# Patient Record
Sex: Female | Born: 1935 | Race: White | Hispanic: No | Smoking: Never smoker
Health system: Southern US, Community
[De-identification: ages and names within clinical notes are randomized; demographics above are authoritative.]

## PROBLEM LIST (undated history)

## (undated) DIAGNOSIS — E7800 Pure hypercholesterolemia, unspecified: Secondary | ICD-10-CM

## (undated) DIAGNOSIS — N200 Calculus of kidney: Secondary | ICD-10-CM

## (undated) DIAGNOSIS — E785 Hyperlipidemia, unspecified: Secondary | ICD-10-CM

## (undated) DIAGNOSIS — H532 Diplopia: Secondary | ICD-10-CM

## (undated) HISTORY — PX: OTHER SURGICAL HISTORY: SHX169

## (undated) HISTORY — PX: BACK SURGERY: SHX140

## (undated) HISTORY — PX: TUBAL LIGATION: SHX77

---

## 2000-11-23 ENCOUNTER — Encounter: Payer: Self-pay | Admitting: Internal Medicine

## 2000-11-23 ENCOUNTER — Ambulatory Visit (HOSPITAL_COMMUNITY): Admission: RE | Admit: 2000-11-23 | Discharge: 2000-11-23 | Payer: Self-pay | Admitting: Internal Medicine

## 2001-11-24 ENCOUNTER — Encounter: Payer: Self-pay | Admitting: Internal Medicine

## 2001-11-24 ENCOUNTER — Ambulatory Visit (HOSPITAL_COMMUNITY): Admission: RE | Admit: 2001-11-24 | Discharge: 2001-11-24 | Payer: Self-pay | Admitting: Internal Medicine

## 2002-11-28 ENCOUNTER — Ambulatory Visit (HOSPITAL_COMMUNITY): Admission: RE | Admit: 2002-11-28 | Discharge: 2002-11-28 | Payer: Self-pay | Admitting: Internal Medicine

## 2003-01-06 ENCOUNTER — Other Ambulatory Visit: Admission: RE | Admit: 2003-01-06 | Discharge: 2003-01-06 | Payer: Self-pay | Admitting: Obstetrics & Gynecology

## 2003-01-29 ENCOUNTER — Inpatient Hospital Stay (HOSPITAL_COMMUNITY): Admission: EM | Admit: 2003-01-29 | Discharge: 2003-02-01 | Payer: Self-pay | Admitting: Emergency Medicine

## 2005-06-26 ENCOUNTER — Ambulatory Visit (HOSPITAL_COMMUNITY): Admission: RE | Admit: 2005-06-26 | Discharge: 2005-06-26 | Payer: Self-pay | Admitting: Internal Medicine

## 2006-07-06 ENCOUNTER — Ambulatory Visit (HOSPITAL_COMMUNITY): Admission: RE | Admit: 2006-07-06 | Discharge: 2006-07-06 | Payer: Self-pay | Admitting: Internal Medicine

## 2007-07-27 ENCOUNTER — Emergency Department (HOSPITAL_COMMUNITY): Admission: EM | Admit: 2007-07-27 | Discharge: 2007-07-27 | Payer: Self-pay | Admitting: Emergency Medicine

## 2008-01-26 ENCOUNTER — Ambulatory Visit (HOSPITAL_COMMUNITY): Admission: RE | Admit: 2008-01-26 | Discharge: 2008-01-26 | Payer: Self-pay | Admitting: Orthopedic Surgery

## 2008-01-31 ENCOUNTER — Inpatient Hospital Stay (HOSPITAL_COMMUNITY): Admission: RE | Admit: 2008-01-31 | Discharge: 2008-02-02 | Payer: Self-pay | Admitting: Orthopedic Surgery

## 2009-07-26 ENCOUNTER — Ambulatory Visit (HOSPITAL_COMMUNITY): Admission: RE | Admit: 2009-07-26 | Discharge: 2009-07-26 | Payer: Self-pay | Admitting: Internal Medicine

## 2010-01-28 ENCOUNTER — Encounter: Payer: Self-pay | Admitting: Internal Medicine

## 2010-04-22 LAB — CBC
MCHC: 32.5 g/dL (ref 30.0–36.0)
MCV: 98 fL (ref 78.0–100.0)
Platelets: 237 10*3/uL (ref 150–400)
RBC: 4.48 MIL/uL (ref 3.87–5.11)

## 2010-04-22 LAB — BASIC METABOLIC PANEL
Calcium: 9.9 mg/dL (ref 8.4–10.5)
Chloride: 105 mEq/L (ref 96–112)
Creatinine, Ser: 0.67 mg/dL (ref 0.4–1.2)
GFR calc Af Amer: >60 mL/min (ref >60)
GFR calc non Af Amer: >60 mL/min (ref >60)
Glucose, Bld: 103 mg/dL — ABNORMAL HIGH (ref 70–99)
Potassium: 3.9 mEq/L (ref 3.5–5.1)
Sodium: 141 mEq/L (ref 135–145)

## 2010-04-22 LAB — HEMOGLOBIN AND HEMATOCRIT, BLOOD
HCT: 32 % — ABNORMAL LOW (ref 36.0–46.0)
Hemoglobin: 10.7 g/dL — ABNORMAL LOW (ref 12.0–15.0)

## 2010-05-21 NOTE — Op Note (Signed)
[name], BOEHNING              ACCOUNT NO.:  [id number]   MEDICAL RECORD NO.:  [id number]          PATIENT TYPE:  OIB   LOCATION:  5028                         FACILITY:  [facility]   PHYSICIAN:  Marlowe Kays, M.D.  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   OPERATION:  Central foraminal decompression L3-4, L4-5, and L5-S1.   SURGEON:  Marlowe Kays, MD   ASSISTANT:  Jene Every, MD   ANESTHESIA:  General.   JUSTIFICATION OF PROCEDURE:  Her symptoms remain in the left leg with  numbness over the lateral calf and foot area.  She also had a feeling of  weakness in the left foot, which is becoming progressively more severe,  slight similar problem on the right leg.  MRIs demonstrated significant  spinal stenosis and foraminal stenosis of all levels.  At L4-5, she had  a central disk bulge slightly more than at L5-S1.  This was creating  foraminal stenosis.   PROCEDURE:  Prophylactic antibiotics, satisfactory general anesthesia,  Foley catheter inserted, knee-chest position on the Andrews frame, back  was prepped with DuraPrep in sterile field.  Midline incision and I  dissected over where I thought were the spinous processes of L4-L5 and I  tagged these with Kocher clamps and took a lateral x-ray confirming that  we were indeed at L4-L5.  Dissected little further distally to expose  the sacrum and little more proximally to expose the L3 spinous process.  Soft tissue was dissected off the neural arches of all these levels and  2 McCullough retractors were placed.  With double-action rongeur, I  began removing all of the spinous process and good portion of neural  arch at L5 and L4 and large portion of L3.  Working first with  combination of 2 and 3-mm Kerrison rongeurs centrally, I then began  decompression of the interval L3 of the sacrum and when the  decompression became little more exact, we brought in the microscope to  complete it.  In the area of L4-5 on the left, she had enough  compression of bone that there was a small bleb in the dura, we noted,  and stayed away from this so as not to create an iatrogenic tear.  Her  foramen was patent to hockey stick at all levels.  We felt it was too  risky to look for the disk at L4-5 because of this bleb and in L5-S1,  the major problem was the creation of foraminal stenosis, which we  widely opened.  Therefore, trying to minimize risk, we felt at this  point that the decompression was completed.  The wound was irrigated  with sterile saline.  I gently placed Gelfoam beneath the residual  lateral bone on the left to cover up the bleb area, then covered the  remainder of the dura with the Gelfoam soaked in thrombin  as well.  Self-  retaining retractors were carefully removed and the fascia and muscle  closed as a layer with interrupted #1 Vicryl, leaving about a centimeter  and half aperture distally for egress of any fluid.  Subcutaneous  tissue was closed with 2-0 Vicryl.  Staples in the skin.  Betadine  Adaptic pressure dressing were applied.  She tolerated the procedure  well and was taken to recovery room in satisfactory condition with no  known complication.  Estimated blood loss was perhaps 400 mL.  No blood  replacement.           ______________________________  Marlowe Kays, M.D.     JA/MEDQ  D:  01/31/2008  T:  02/01/2008  Job:  [id number]

## 2010-05-21 NOTE — Discharge Summary (Signed)
[name], [name]              ACCOUNT NO.:  [id number]   MEDICAL RECORD NO.:  [id number]          PATIENT TYPE:  INP   LOCATION:  5028                         FACILITY:  [facility]   PHYSICIAN:  Marlowe Kays, M.D.  DATE OF BIRTH:  05/19/1934   DATE OF ADMISSION:  01/31/2008  DATE OF DISCHARGE:  02/02/2008                               DISCHARGE SUMMARY   ADMITTING DIAGNOSES:  1. Spinal foraminal stenosis at L3-L4, L4-L5, and L5-S1.  2. Hyperlipidemia.   DISCHARGE DIAGNOSES:  1. Spinal foraminal stenosis at L3-L4, L4-L5, and L5-S1.  2. Hyperlipidemia.  3. Underweight.  4. Mild postoperative anemia.   BRIEF HISTORY:  This 76 year old white female seen by [place] for left leg  numbness of the lateral calf and foot area.  Also had weakness in the  left foot.  She had a little difficulty with dorsiflexion of the foot  which had become more progressively severe.  Similar problems seen on  the right, but more so on the left.  She was sent for an MRI which  showed significant spinal stenosis as well as foraminal stenosis at all  levels.  Central disk bulge was seen at L4-5 and other one at L5-S1.  After much discussion including the risks and benefits of surgery, it  was decided she would benefit with the above procedure and was scheduled  for same.   COURSE IN THE HOSPITAL:  The patient tolerated surgical procedure quite  well.  Postoperatively, she was a little bit slow in getting going.  Fortunately, some of the weakness diminished in left lower extremity,  left with some mild numbness.  She could dorsiflex fairly well with the  left foot.  Wound remained clean and dry postoperatively, and today I  changed the dressing.   The patient has some nausea postoperatively.  We had a little difficulty  in trying to find a medication that would control some of her discomfort  as well as not cause nausea.  We settled on Talwin NX, which seemed to  help her.  She also had some positional  nausea accompanied by vomiting.  She had positive flatus and her abdomen was soft, so in all probability  she was not developing an ileus.  Today, we will check her hematocrit  and hemoglobin and if it is stable and she is able to work well with  physical therapy, she will be discharged home.  If not, we will keep her  another day for the patient's safety and confidence.  She was seen by  Earl Lagos, RN for the BMI being at 18.4, and we documented that  she was underweight.  Also with a hemoglobin at 10.9.  Postoperative  blood loss anemia was included.  Other laboratory values in the hospital  showed a preoperative CBC within normal limits.  Hemoglobin was 14.3 and  hematocrit was 43.9.  No chemistries were seen.  Chest x-ray showed mild  COPD changes, no acute pulmonary findings.  Electrocardiogram showed  normal sinus rhythm.   CONDITION ON ANTICIPATED DISCHARGE:  Improved, stable.   PLAN:  Again, if the patient has confidence  with no nausea in the  physical therapy day, we will discharge her home.  She is to return to  the office in 2 weeks after date of surgery.  She may shower 4 days  after surgery.  Use dry dressing as needed.   DISCHARGE MEDICATIONS:  She is to continue with her home medications:  1. Simvastatin 40 mg daily.  2. CoQ-10 100 mg at bedtime.  3. MVT multivitamin daily.  4. Estra-C daily.  5. Gabapentin which was prescribed preoperatively causes extreme dry      mouth, we therefore, will not continue that medication.  She has      only taken a couple.   Discharge medications from [place]:  1. Talwin NX for pain.  2. Robaxin 500 mg for muscle spasm.  3. Reglan 10 for any nausea.  4. I told them to pick up a stool softener when they go by the drug      store to get their medications filled as well as a laxative.   She is encouraged to call the office should they have any problems or  questions prior to their return appointment.  All questions were   encouraged and answered today at bedside.      Dooley L. Cherlynn June.    ______________________________  Marlowe Kays, M.D.    DLU/MEDQ  D:  02/02/2008  T:  02/02/2008  Job:  [id number]

## 2010-05-24 NOTE — H&P (Signed)
NAME:  [name], [name]                        ACCOUNT NO.:  [id number]   MEDICAL RECORD NO.:  [id number]                   PATIENT TYPE:  INP   LOCATION:  A318                                 FACILITY:  [facility]   PHYSICIAN:  [name], M.D.           DATE OF BIRTH:  Apr 15, 1934   DATE OF ADMISSION:  01/29/2003  DATE OF DISCHARGE:                                HISTORY & PHYSICAL   HISTORY:  This 76 year old woman has had cramps and diarrhea for the last 3  or 4 days and noted a little bit of blood on the toilet tissue last night  but in the early morning hours had some free rectal bleeding which repeated  itself several times.  At that point she came to the ER.   About 10 days ago she was seen by her LMD Dr. Ouida [name] for left facial and eye  pain which was felt to be sinusitis.  The following day she saw her dentist  and was found to have an abscess tooth.  She had been started on amoxicillin  by Dr. Ouida [name] and her dentist pulled the tooth and added metronidazole t.i.d.   She noted while on these antibiotics that she had nausea and eventually had  vomiting and dry heaves the last few days.   She generally had been healthy.  On this occasion she really had no fever  and chills.  She has noted the left eye and maxillary sinus area pain as  mentioned above.   Years ago she had a bilateral tubal ligation and also what sounds like  incidental appendectomy.  She had a small skin cancer removed from the face.  She did have a hemorrhoidectomy 3 years ago at the time she had a  colonoscopy which did show diverticuli.  Apparently she had a small  diverticular leak, which resulted in her being treated in the hospital with  IV antibiotics for 3 days after this.   She smoked cigarettes starting in her 87s but not smoking during her three  pregnancies for about 35 years of cigarette smoking.  Currently she is on  Zocor through Dr. Ouida [name] for hypercholesterolemia.   ADMISSION PHYSICAL  EXAMINATION:  She is a pleasant 76 year old woman who  presented to the emergency room this morning with a temperature of 98.4,  blood pressure 89/61, pulse 115, respiratory rate 20.  Within 3 or 4 hours  on IV fluids the blood pressure was 106/69, pulse 75, and then rechecked  several hours later 115/69 and 78.  O2 saturation was 97%.  On my exam she  is oriented, alert, no acute distress, well-developed, well-nourished 83-  year-old woman.  She had no axillary or supraclavicular adenopathy.  The  lungs were clear throughout moving air well.  The heart had a regular  rhythm, rate of about 70.  The abdomen was soft without hepatosplenomegaly  or mass but there was tenderness in the right lower  quadrant but not in the  right upper quadrant, epigastrium, left upper quadrant, or left lower  quadrant.  There is no edema of the ankles.   ADMISSION BLOOD WORK:  White cell count 20,400 of which 90% were  neutrophils.  The H&H were 13.8 and 40.5, MCV of 93, platelet count 315,000.  Further she had an albumin of 2.9 and MET-7 showed a potassium of 3.6,  sodium 135, glucose 127, BUN 9, creatinine 0.8.  UA shows specific gravity  1.004 and there were 7-10 wbc's per HPF, 0-2 rbc's per HPF, with few  bacteria.   Abdominal CT scan showed edema in the ascending and transverse colon.   ADMITTING DIAGNOSES:  1. Colitis.  2. Hypercholesterolemia.  3. History of cigarette smoking.  4. Left molar abscess which has resolved.   Colitis could be a pseudomembranous colitis secondary to antibiotic use, it  could represent antibiotic-associated diarrhea, it could represent ischemic  colitis given her risk factors of age, tobacco use and hypercholesterolemia,  or it could represent just a bacterial colitis.   Plan of treatment includes metronidazole p.o. three times daily and Levaquin  500 mg IV once daily.  We will recheck a CBC/MET-7 in the morning.  I have  discussed her case at length with Dr. Roetta [name], gastroenterologist.  We  will hold Advil, which she used once her cramps had started and also hold  Zocor until she returns home.     ___________________________________________                                         [name], M.D.   SDK/MEDQ  D:  01/29/2003  T:  01/29/2003  Job:  [id number]

## 2010-05-24 NOTE — Consult Note (Signed)
NAME:  Kathleen, [name]                        ACCOUNT NO.:  [id number]   MEDICAL RECORD NO.:  [id number]                   PATIENT TYPE:  INP   LOCATION:  A318                                 FACILITY:  [facility]   PHYSICIAN:  R. Roetta Sessions, M.D.              DATE OF BIRTH:  09-19-1934   DATE OF CONSULTATION:  01/29/2003  DATE OF DISCHARGE:                                   CONSULTATION   REASON FOR CONSULTATION:  Bloody diarrhea.   HISTORY OF PRESENT ILLNESS:  [name] is a very pleasant 25-  year-old Caucasian female who was in her usual state of fairly good health  until the past couple of weeks.  She developed some left mouth pain  initially felt to be sinusitis but it ultimately turned out to be a tooth  abscess.  She initially saw Dr. Ouida Sills, was started on amoxicillin.  She  ultimately saw Dr. Boris Lown, an oral surgeon down in [place], who pulled  tooth and found it to be abscessed, placed a drain.  Recent antibiotics  included amoxicillin, more recently metronidazole.  She was doing okay until  three days ago when she developed what she describes as waves of severe  abdominal cramps followed by multiple (greater than 20) nonbloody stools on  January 21 and January 28, 2003.  When she awoke this morning she started  having gross blood per rectum and felt weak; she was brought to the  emergency department where she was evaluated further.  She was found to be  hemodynamically stable although her systolic blood pressure was in the low  100 range.  She was found to have a white count of 20,400, H&H of 13.8 and  40.5.  CT scan of the abdomen demonstrated marked edema of the ascending and  transverse colon suggestive of colitis, there was no free air or evidence of  abscess.  She has been admitted to the hospital and placed on IV Cipro and  Flagyl.   She denies having any similar symptoms in the past.  She generally has one  bowel movement to two bowel movements daily.   No family history of IBD or  colorectal cancer.  She describes undergoing a colonoscopy here by Dr. Franky Macho some three years ago which was complicated by diverticular  perforation.  She tells me that she spent three days in the hospital and was  treated conservatively.  She did not have to have an operation (those  records are unavailable at this time).   PAST MEDICAL HISTORY:  Significant for hypercholesterolemia for which she  takes Zocor.  She denies any significant cardiopulmonary problems or  diabetes.   PAST SURGICAL HISTORY:  1. Bilateral tubal ligation.  2. Incidental appendectomy by Dr. Leveda Anna many years ago.  3. She did have a hemorrhoidectomy three years ago around the time of her     colonoscopy.   MEDICATIONS ON  ADMISSION:  1. Recent amoxicillin and metronidazole.  2. Zocor.   ALLERGIES:  NO KNOWN DRUG ALLERGIES.   FAMILY HISTORY:  No chronic GI or liver disease.   SOCIAL HISTORY:  The patient is married.  She lives in [place].  She has  a son who resides in [place] and a daughter who lives in [place].  She has a couple of grandchildren.  She had a 62-1/2 year-old son who died  with a virus.  She was a long-term smoker until stopping three years ago.  Notes no alcohol.  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   REVIEW OF SYSTEMS:  No recent chest pain, no dyspnea.  No odynophagia,  dysphagia, early sign of reflux symptoms.  She has had some nausea today and  the abdominal cramps, diarrhea and blood per rectum as outlined above.   PHYSICAL EXAMINATION:  Reveals a 76 year old lady ambulating in her room.  She is alert, conversant, appears to be in no acute distress.  BP 115/69,  pulse 78,  respiratory rate 20.  Skin warm and dry, there is no jaundice, no  continuous stigmata of chronic liver disease.  HEENT EXAM:  No scleral icterus, conjunctiva or pain core cavity, no  lesions.  JVD is not prominent.  CHEST/LUNGS:  Clear to  auscultation.  CARDIAC EXAM:  Regular rate and rhythm without murmur, gallop or rub.  BREAST EXAM:  Deferred.  ABDOMEN:  Nondistended, positive bowel sounds, soft.  She has mild diffuse  tenderness.  If anything, she is more tender diffusely on the left than the  right.  Liver is not enlarged but percussed right down to just below the  right costal margin.  There is no rebound, there is no mass, there is no  obvious ascites.  CT scan as stated above.  Some free fluid and notable  edema of the ascending and transverse colon.   ADDITIONAL LABORATORY DATA:  H&H 13.8 and 4.5, MCV 93.8.  Sodium 135,  potassium 3.6, chloride 105, CO2 28, glucose 127, BUN 9, creatinine 0.8,  bilirubin 0.6, alkaline phosphatase 53, SGOT 24, SGPT 13, lipase 30.  Urinalysis:  Trace ketones, small blood, trace leukocytes, 7-10 white cells.   IMPRESSION:  [name] is a pleasant 76 year old lady admitted to  the hospital with an acute illness characterized by abdominal cramps and  diarrhea which has now become bloody.  She has inflammatory changes on the  abdominal CT consistent with colitis.   She has recently been on antibiotics including amoxicillin, more recently  metronidazole.   This scenario is highly suspicious for Pseudomembranous colitis secondary to  amoxicillin.  However, ischemic colitis would also remain in the  differential, however she has had a more protracted period of nonbloody  diarrhea before the onset of blood per rectum to go with the classic  scenario for ischemic colitis which usually comes and goes in approximately  36 hours.  She does not look toxic at this time and no evidence of megacolon  or recent CT.   RECOMMENDATIONS:  1. I agree with starting IV Flagyl.  If this is a C.dif-related infection,     the Flagyl will be more efficacious given orally if she can tolerate it.     Will go with p.o. Flagyl 250 mg orally q.i.d. for the next 10 days.  If    she has difficulties,  will have to consider oral vancomycin pending     results of the C.dif  toxin assay.  Will also add Levsin sublingually     0.125 mg under the tongue before meals and at bedtime.  2. I have recommend to [name] she consider having a colonoscopy as an     outpatient once she is over the acute illness, however she has     reservations given her experience three years ago.   I thank Drs. Isabel Caprice for allowing [place] to see this nice lady today.      ___________________________________________                                            Jonathon Bellows, M.D.   RMR/MEDQ  D:  01/29/2003  T:  01/29/2003  Job:  [id number]   cc:   Mila Homer. Sudie Bailey, M.D.  [address] [place], [place] [address]  Fax: [phone]   Kingsley Callander. Ouida Sills, M.D.  [address]  [place]  [place] [address]  Fax: [phone]

## 2010-05-24 NOTE — Discharge Summary (Signed)
NAME:  [name], [name]                        ACCOUNT NO.:  [id number]   MEDICAL RECORD NO.:  [id number]                   PATIENT TYPE:  INP   LOCATION:  A318                                 FACILITY:  [facility]   PHYSICIAN:  Kingsley Callander. Ouida Sills, M.D.                  DATE OF BIRTH:  [birth year]   DATE OF ADMISSION:  01/29/2003  DATE OF DISCHARGE:                                 DISCHARGE SUMMARY   DISCHARGE DIAGNOSES:  1. Pseudomembranous colitis.  2. Hypokalemia.  3. Hypercholesterolemia.   PROCEDURES:  CT scan of the abdomen and pelvis.   HOSPITAL COURSE:  This patient is a 76 year old white female who presented  with crampy abdominal pain and diarrhea.  She was evaluated in the emergency  room with an acute abdominal series which revealed a nonobstructive bowel  gas pattern.  She had a CT scan of the abdomen and pelvis which revealed  mucosal edema of the right colon and transverse colon consistent with  colitis.  The patient had recently been on amoxicillin and metronidazole for  a dental abscess.  Her white count was 20.4 with 90 segs and 5 lymphs.  Her  albumin was 2.9.  Her stool for white cells revealed only a few white cells.  Two Clostridium difficile toxin titers were negative.  Her ova and parasite  exam was negative.  Her culture has been reincubated for better growth.   She was treated with oral metronidazole.  Her pain and diarrhea gradually  improved.  A repeat white count on January 30, 2003 was 18.5.  Her diet was  advanced from clear liquids to a bland diet.  She tolerated this reasonably  well.  Her repeat white count was 5.6.   She was hypokalemic at 3.1 but normalized to 4.1 prior to discharge.   She was seen in consultation by Dr. Jena Gauss.  A colonoscopy is advised for  next month.   She was stable for discharge on January 26.  She will follow up in the  office in 7-10 days.   DISCHARGE MEDICATIONS:  1. Flagyl 250 mg q.i.d.  2. Levsin 0.125 mg q.i.d.  3.  Darvocet-N 100 q.4h. p.r.n.  4. Zocor daily.  5. Coenzyme Q10 daily.     ___________________________________________                                         Kingsley Callander. Ouida Sills, M.D.   ROF/MEDQ  D:  02/01/2003  T:  02/01/2003  Job:  [id number]   cc:   R. Roetta Sessions, M.D.  [address]  [place]  [place] [address]  Fax: [phone]

## 2010-07-25 ENCOUNTER — Other Ambulatory Visit (HOSPITAL_COMMUNITY): Payer: Self-pay | Admitting: Internal Medicine

## 2010-07-25 DIAGNOSIS — Z139 Encounter for screening, unspecified: Secondary | ICD-10-CM

## 2010-07-29 ENCOUNTER — Ambulatory Visit (HOSPITAL_COMMUNITY)
Admission: RE | Admit: 2010-07-29 | Discharge: 2010-07-29 | Disposition: A | Discharge status: Home / Self Care | Payer: MEDICARE | Source: Ambulatory Visit | Attending: Internal Medicine | Admitting: Internal Medicine

## 2010-07-29 DIAGNOSIS — Z139 Encounter for screening, unspecified: Secondary | ICD-10-CM

## 2010-07-29 DIAGNOSIS — Z1231 Encounter for screening mammogram for malignant neoplasm of breast: Secondary | ICD-10-CM | POA: Insufficient documentation

## 2010-10-04 LAB — URINE MICROSCOPIC-ADD ON

## 2010-10-04 LAB — URINE CULTURE

## 2010-10-04 LAB — URINALYSIS, ROUTINE W REFLEX MICROSCOPIC
Hgb urine dipstick: NEGATIVE
Nitrite: NEGATIVE
Urobilinogen, UA: 0.2
pH: 7

## 2011-06-25 ENCOUNTER — Other Ambulatory Visit (HOSPITAL_COMMUNITY): Payer: Self-pay | Admitting: Internal Medicine

## 2011-06-25 DIAGNOSIS — Z139 Encounter for screening, unspecified: Secondary | ICD-10-CM

## 2011-07-21 DIAGNOSIS — E785 Hyperlipidemia, unspecified: Secondary | ICD-10-CM | POA: Diagnosis not present

## 2011-07-21 DIAGNOSIS — Z79899 Other long term (current) drug therapy: Secondary | ICD-10-CM | POA: Diagnosis not present

## 2011-07-28 DIAGNOSIS — E785 Hyperlipidemia, unspecified: Secondary | ICD-10-CM | POA: Diagnosis not present

## 2011-07-28 DIAGNOSIS — Z1212 Encounter for screening for malignant neoplasm of rectum: Secondary | ICD-10-CM | POA: Diagnosis not present

## 2011-07-28 DIAGNOSIS — M5137 Other intervertebral disc degeneration, lumbosacral region: Secondary | ICD-10-CM | POA: Diagnosis not present

## 2011-07-28 DIAGNOSIS — N200 Calculus of kidney: Secondary | ICD-10-CM | POA: Diagnosis not present

## 2011-07-31 ENCOUNTER — Ambulatory Visit (HOSPITAL_COMMUNITY)
Admission: RE | Admit: 2011-07-31 | Discharge: 2011-07-31 | Disposition: A | Discharge status: Home / Self Care | Payer: MEDICARE | Source: Ambulatory Visit | Attending: Internal Medicine | Admitting: Internal Medicine

## 2011-07-31 DIAGNOSIS — Z1231 Encounter for screening mammogram for malignant neoplasm of breast: Secondary | ICD-10-CM | POA: Insufficient documentation

## 2011-07-31 DIAGNOSIS — Z139 Encounter for screening, unspecified: Secondary | ICD-10-CM

## 2011-09-02 ENCOUNTER — Emergency Department (HOSPITAL_COMMUNITY): Payer: MEDICARE

## 2011-09-02 ENCOUNTER — Emergency Department (HOSPITAL_COMMUNITY)
Admission: EM | Admit: 2011-09-02 | Discharge: 2011-09-02 | Disposition: A | Discharge status: Home / Self Care | Payer: MEDICARE | Attending: Emergency Medicine | Admitting: Emergency Medicine

## 2011-09-02 ENCOUNTER — Encounter (HOSPITAL_COMMUNITY): Payer: Self-pay | Admitting: Emergency Medicine

## 2011-09-02 DIAGNOSIS — S5290XA Unspecified fracture of unspecified forearm, initial encounter for closed fracture: Secondary | ICD-10-CM | POA: Diagnosis not present

## 2011-09-02 DIAGNOSIS — S52609A Unspecified fracture of lower end of unspecified ulna, initial encounter for closed fracture: Secondary | ICD-10-CM

## 2011-09-02 DIAGNOSIS — Z4789 Encounter for other orthopedic aftercare: Secondary | ICD-10-CM | POA: Diagnosis not present

## 2011-09-02 DIAGNOSIS — X58XXXA Exposure to other specified factors, initial encounter: Secondary | ICD-10-CM | POA: Insufficient documentation

## 2011-09-02 DIAGNOSIS — S52509A Unspecified fracture of the lower end of unspecified radius, initial encounter for closed fracture: Secondary | ICD-10-CM | POA: Insufficient documentation

## 2011-09-02 DIAGNOSIS — E7800 Pure hypercholesterolemia, unspecified: Secondary | ICD-10-CM | POA: Diagnosis not present

## 2011-09-02 DIAGNOSIS — Z043 Encounter for examination and observation following other accident: Secondary | ICD-10-CM | POA: Diagnosis not present

## 2011-09-02 DIAGNOSIS — Y93H2 Activity, gardening and landscaping: Secondary | ICD-10-CM | POA: Insufficient documentation

## 2011-09-02 DIAGNOSIS — Y998 Other external cause status: Secondary | ICD-10-CM | POA: Insufficient documentation

## 2011-09-02 DIAGNOSIS — Y92009 Unspecified place in unspecified non-institutional (private) residence as the place of occurrence of the external cause: Secondary | ICD-10-CM | POA: Insufficient documentation

## 2011-09-02 HISTORY — DX: Pure hypercholesterolemia, unspecified: E78.00

## 2011-09-02 MED ORDER — HYDROCODONE-ACETAMINOPHEN 7.5-325 MG PO TABS: 1.0000 | ORAL_TABLET | ORAL | Status: DC | PRN

## 2011-09-02 MED ORDER — OXYCODONE-ACETAMINOPHEN 5-325 MG PO TABS
ORAL_TABLET | ORAL | Status: AC
  Administered 2011-09-02: 1
  Filled 2011-09-02: qty 1

## 2011-09-02 MED ORDER — LIDOCAINE HCL (PF) 1 % IJ SOLN
INTRAMUSCULAR | Status: AC
  Administered 2011-09-02: 10 mL
  Filled 2011-09-02: qty 10

## 2011-09-02 MED ORDER — OXYCODONE-ACETAMINOPHEN 5-325 MG PO TABS: 1.0000 | ORAL_TABLET | Freq: Four times a day (QID) | ORAL | Status: DC | PRN

## 2011-09-02 NOTE — ED Notes (Signed)
Pt c/o left wrist/hand pain swelling. Denies trauma.

## 2011-09-02 NOTE — ED Notes (Signed)
Sling applied. Dr Adriana Simas aware pt ready to be d/c.

## 2011-09-02 NOTE — ED Notes (Signed)
EDP did not know dr Hilda Lias wrote pt a rx for pain. EDP rx tore and in shred box. Dr Adriana Simas aware

## 2011-09-02 NOTE — Consult Note (Signed)
Reason for Consult:Fracture of the distal left radius Referring Physician: ER  [name] is an 77 y.o. female.  HPI: She fell at home today while using a sprayer and Roundup.  She has pain in the left wrist with deformity and decreased use.  She has no other injury.  Past Medical History  Diagnosis Date  . High cholesterol     Past Surgical History  Procedure Date  . Tubal ligation   . Back surgery     No family history on file.  Social History:  reports that she has never smoked. She does not have any smokeless tobacco history on file. She reports that she does not drink alcohol or use illicit drugs.  Allergies: No Known Allergies  Medications: I have reviewed the patient's current medications.  No results found for this or any previous visit (from the past 48 hour(s)).  Dg Wrist Complete Left  09/02/2011  *RADIOLOGY REPORT*  Clinical Data: Traumatic injury and pain  LEFT WRIST - COMPLETE 3+ VIEW  Comparison: None.  Findings: There is a comminuted fracture of the distal left radius of the impaction there is a mildly displaced distal ulnar fracture as well.  and posterior angulation at the fracture site.  IMPRESSION: Distal left radial and ulnar fractures as described.   Original Report Authenticated By: Phillips Odor, M.D.     Review of Systems  Musculoskeletal: Positive for joint pain (She fell at home today and hurt the left wrist with deformity. She has no other injury.) and falls.  All other systems reviewed and are negative.   Blood pressure 102/67, pulse 84, temperature 98.2 F (36.8 C), temperature source Oral, resp. rate 20, height 5\' 3"  (1.6 m), weight 49.896 kg (110 lb), SpO2 99.00%. Physical Exam  Constitutional: She is oriented to person, place, and time. She appears well-developed and well-nourished.  HENT:  Head: Normocephalic.  Eyes: Conjunctivae and EOM are normal. Pupils are equal, round, and reactive to light.  Neck: Normal range of motion. Neck  supple.  Cardiovascular: Normal rate, regular rhythm and intact distal pulses.   Respiratory: Effort normal and breath sounds normal.  GI: Soft. Bowel sounds are normal.  Musculoskeletal: She exhibits tenderness (Pain, deformity left distal radius, swelling, closed injury.).       Left wrist: She exhibits decreased range of motion, tenderness, bony tenderness, swelling, crepitus and deformity.       Arms: Neurological: She is alert and oriented to person, place, and time. She has normal reflexes.  Skin: Skin is warm and dry.  Psychiatric: She has a normal mood and affect. Her behavior is normal. Judgment and thought content normal.    Assessment/Plan: Fracture of the left distal radius, closed, with dorsal displacement.  I have explained the findings to her.  I told her a closed reduction would be done.  If the reduction cannot be done well or if the fracture should "slip" later, she might need surgery.  I have done sterile prep, 1% Xylocaine block, closed reduction of the distal left radius.  Sugar tong splint applied.  I will see her in the office tomorrow morning. Return here if any problem.  Sling and ice given.  She awaits post reduction x-rays.   [name] 09/02/2011, 3:39 PM

## 2011-09-02 NOTE — ED Notes (Signed)
Pt tolerated well. Rating pain 2 after procedure. Post reduction xray done. Nad.

## 2011-09-02 NOTE — ED Provider Notes (Signed)
History  This chart was scribed for Kathleen Hutching, MD by Ladona Ridgel Day. This patient was seen in room APA04/APA04 and the patient's care was started at 1217.   CSN: [id number]  Arrival date & time 09/02/11  1217   First MD Initiated Contact with Patient 09/02/11 1323      Chief Complaint  Patient presents with  . Hand Pain   Patient is a 77 y.o. female presenting with wrist pain. The history is provided by the patient. No language interpreter was used.  Wrist Pain This is a new problem. The current episode started 1 to 2 hours ago. The problem occurs constantly. The problem has not changed since onset.Pertinent negatives include no chest pain, no abdominal pain and no shortness of breath. The symptoms are aggravated by bending. Nothing relieves the symptoms. She has tried a cold compress for the symptoms.   [name] is a 77 y.o. female who presents to the Emergency Department complaining of sudden left wrist/hand pain which began while she was spraying weeds in yard with a roundup bottle about one hour ago. She states that she did not injure her wrist and that her wrist began suddenly swelling. She states that she was spraying the bottle for about 20 minutes when her symptoms began. She presents with swollen and tender left wrist; ice applied in triage.    Past Medical History  Diagnosis Date  . High cholesterol     Past Surgical History  Procedure Date  . Tubal ligation   . Back surgery     No family history on file.  History  Substance Use Topics  . Smoking status: Never Smoker   . Smokeless tobacco: Not on file  . Alcohol Use: No    OB History    Grav Para Term Preterm Abortions TAB SAB Ect Mult Living                  Review of Systems  Constitutional: Negative for fever and chills.  HENT: Negative for congestion.   Respiratory: Negative for shortness of breath.   Cardiovascular: Negative for chest pain.  Gastrointestinal: Negative for nausea, vomiting and  abdominal pain.  Musculoskeletal: Positive for joint swelling (Left wrist pain and swelling. ).  Neurological: Negative for weakness.  All other systems reviewed and are negative.    Allergies  Review of patient's allergies indicates no known allergies.  Home Medications   Current Outpatient Rx  Name Route Sig Dispense Refill  . ESTER C PO Oral Take 1 tablet by mouth every morning.    Marland Kitchen VITAMIN D 1000 UNITS PO TABS Oral Take 2,000 Units by mouth daily.    . CO Q-10 PO Oral Take 1 capsule by mouth at bedtime. 200 mg (over the counter)    . ADULT MULTIVITAMIN W/MINERALS CH Oral Take 1 tablet by mouth daily.    Marland Kitchen SIMVASTATIN 40 MG PO TABS Oral Take 40 mg by mouth at bedtime.      Triage Vitals: BP 102/67  Pulse 84  Temp 98.2 F (36.8 C) (Oral)  Resp 20  Ht 5\' 3"  (1.6 m)  Wt 110 lb (49.896 kg)  BMI 19.49 kg/m2  SpO2 99%  Physical Exam  Nursing note and vitals reviewed. Constitutional: She is oriented to person, place, and time. She appears well-developed and well-nourished.  HENT:  Head: Normocephalic and atraumatic.  Eyes: Conjunctivae and EOM are normal. Pupils are equal, round, and reactive to light.  Neck: Normal range of motion. Neck  supple.  Cardiovascular: Normal rate, regular rhythm and normal heart sounds.   Pulmonary/Chest: Effort normal and breath sounds normal.  Abdominal: Soft. Bowel sounds are normal.  Musculoskeletal: She exhibits edema and tenderness.       Tender to her left distal radius and ulna. Pain with ROM.  Neurological: She is alert and oriented to person, place, and time.  Skin: Skin is warm and dry.  Psychiatric: She has a normal mood and affect.    ED Course  Procedures (including critical care time) DIAGNOSTIC STUDIES: Oxygen Saturation is 99% on room air, normal by my interpretation.    COORDINATION OF CARE: At 125 PM Discussed treatment plan with patient which includes pain medicine, and orthopedics consult. Patient agrees.   Labs  Reviewed - No data to display Dg Wrist Complete Left  09/02/2011  *RADIOLOGY REPORT*  Clinical Data: Traumatic injury and pain  LEFT WRIST - COMPLETE 3+ VIEW  Comparison: None.  Findings: There is a comminuted fracture of the distal left radius of the impaction there is a mildly displaced distal ulnar fracture as well.  and posterior angulation at the fracture site.  IMPRESSION: Distal left radial and ulnar fractures as described.   Original Report Authenticated By: Phillips Odor, M.D.      No diagnosis found.    MDM   Plain film showed distal left radius and ulnar fracture. Consult Dr. Hilda Lias.  Neurovascular intact    I personally performed the services described in this documentation, which was scribed in my presence. The recorded information has been reviewed and considered.          Kathleen Hutching, MD 09/02/11 1610

## 2011-09-02 NOTE — ED Notes (Signed)
vo by dr Adriana Simas, one percocet tablet PO

## 2011-09-03 DIAGNOSIS — S52539A Colles' fracture of unspecified radius, initial encounter for closed fracture: Secondary | ICD-10-CM | POA: Diagnosis not present

## 2011-09-04 ENCOUNTER — Other Ambulatory Visit (HOSPITAL_COMMUNITY): Payer: Self-pay | Admitting: Orthopaedic Surgery

## 2011-09-04 DIAGNOSIS — S52539A Colles' fracture of unspecified radius, initial encounter for closed fracture: Secondary | ICD-10-CM | POA: Diagnosis not present

## 2011-09-04 NOTE — Pre-Procedure Instructions (Signed)
Patient instructed NPO for solid food after midnight, then may have clear liquids til 9 am then NPO except meds with a sip. To arrive in [facility] at 1415 pm.

## 2011-09-05 ENCOUNTER — Encounter (HOSPITAL_COMMUNITY): Payer: Self-pay | Admitting: Certified Registered Nurse Anesthetist

## 2011-09-05 ENCOUNTER — Ambulatory Visit (HOSPITAL_COMMUNITY)
Admission: RE | Admit: 2011-09-05 | Discharge: 2011-09-06 | Disposition: A | Discharge status: Home / Self Care | Payer: MEDICARE | Source: Ambulatory Visit | Attending: Orthopaedic Surgery | Admitting: Orthopaedic Surgery

## 2011-09-05 ENCOUNTER — Encounter (HOSPITAL_COMMUNITY): Payer: Self-pay | Admitting: *Deleted

## 2011-09-05 ENCOUNTER — Ambulatory Visit (HOSPITAL_COMMUNITY): Payer: MEDICARE | Admitting: Certified Registered Nurse Anesthetist

## 2011-09-05 ENCOUNTER — Encounter (HOSPITAL_COMMUNITY)
Admission: RE | Disposition: A | Discharge status: Home / Self Care | Payer: Self-pay | Source: Ambulatory Visit | Attending: Orthopaedic Surgery

## 2011-09-05 DIAGNOSIS — E7800 Pure hypercholesterolemia, unspecified: Secondary | ICD-10-CM | POA: Insufficient documentation

## 2011-09-05 DIAGNOSIS — S52609A Unspecified fracture of lower end of unspecified ulna, initial encounter for closed fracture: Secondary | ICD-10-CM | POA: Insufficient documentation

## 2011-09-05 DIAGNOSIS — S52509A Unspecified fracture of the lower end of unspecified radius, initial encounter for closed fracture: Secondary | ICD-10-CM | POA: Diagnosis not present

## 2011-09-05 DIAGNOSIS — Z79899 Other long term (current) drug therapy: Secondary | ICD-10-CM | POA: Diagnosis not present

## 2011-09-05 DIAGNOSIS — S52502A Unspecified fracture of the lower end of left radius, initial encounter for closed fracture: Secondary | ICD-10-CM

## 2011-09-05 DIAGNOSIS — Y9289 Other specified places as the place of occurrence of the external cause: Secondary | ICD-10-CM | POA: Insufficient documentation

## 2011-09-05 DIAGNOSIS — S52599B Other fractures of lower end of unspecified radius, initial encounter for open fracture type I or II: Secondary | ICD-10-CM | POA: Diagnosis not present

## 2011-09-05 DIAGNOSIS — W19XXXA Unspecified fall, initial encounter: Secondary | ICD-10-CM | POA: Insufficient documentation

## 2011-09-05 DIAGNOSIS — Y93H2 Activity, gardening and landscaping: Secondary | ICD-10-CM | POA: Insufficient documentation

## 2011-09-05 LAB — SURGICAL PCR SCREEN
MRSA, PCR: NEGATIVE
Staphylococcus aureus: NEGATIVE

## 2011-09-05 SURGERY — OPEN REDUCTION INTERNAL FIXATION (ORIF) DISTAL RADIUS FRACTURE
Anesthesia: General | Site: Wrist | Laterality: Left | Wound class: Clean

## 2011-09-05 MED ORDER — METOCLOPRAMIDE HCL 10 MG PO TABS: 5.0000 mg | ORAL_TABLET | Freq: Three times a day (TID) | ORAL | Status: DC | PRN

## 2011-09-05 MED ORDER — HYDROMORPHONE HCL PF 1 MG/ML IJ SOLN
INTRAMUSCULAR | Status: AC
  Filled 2011-09-05: qty 1

## 2011-09-05 MED ORDER — BUPIVACAINE HCL (PF) 0.25 % IJ SOLN
INTRAMUSCULAR | Status: AC
  Filled 2011-09-05: qty 30

## 2011-09-05 MED ORDER — ACETAMINOPHEN 10 MG/ML IV SOLN
INTRAVENOUS | Status: DC | PRN
  Administered 2011-09-05: 1000 mg via INTRAVENOUS

## 2011-09-05 MED ORDER — METHOCARBAMOL 500 MG PO TABS: 500.0000 mg | ORAL_TABLET | Freq: Four times a day (QID) | ORAL | Status: DC | PRN

## 2011-09-05 MED ORDER — SODIUM CHLORIDE 0.9 % IV SOLN
INTRAVENOUS | Status: DC
  Administered 2011-09-05: 20 mL/h via INTRAVENOUS

## 2011-09-05 MED ORDER — HYDROCODONE-ACETAMINOPHEN 5-325 MG PO TABS
1.0000 | ORAL_TABLET | ORAL | Status: DC | PRN
  Administered 2011-09-06 (×2): 1 via ORAL
  Administered 2011-09-06: 2 via ORAL
  Filled 2011-09-05: qty 1
  Filled 2011-09-05: qty 2
  Filled 2011-09-05: qty 1

## 2011-09-05 MED ORDER — ZOLPIDEM TARTRATE 5 MG PO TABS: 5.0000 mg | ORAL_TABLET | Freq: Every evening | ORAL | Status: DC | PRN

## 2011-09-05 MED ORDER — DIPHENHYDRAMINE HCL 12.5 MG/5ML PO ELIX: 12.5000 mg | ORAL_SOLUTION | ORAL | Status: DC | PRN

## 2011-09-05 MED ORDER — LIDOCAINE HCL (CARDIAC) 20 MG/ML IV SOLN
INTRAVENOUS | Status: DC | PRN
  Administered 2011-09-05: 70 mg via INTRAVENOUS

## 2011-09-05 MED ORDER — ONDANSETRON HCL 4 MG/2ML IJ SOLN
INTRAMUSCULAR | Status: DC | PRN
  Administered 2011-09-05: 4 mg via INTRAVENOUS

## 2011-09-05 MED ORDER — LACTATED RINGERS IV SOLN
INTRAVENOUS | Status: DC
  Administered 2011-09-05: 1000 mL via INTRAVENOUS

## 2011-09-05 MED ORDER — ONDANSETRON HCL 4 MG/2ML IJ SOLN: 4.0000 mg | Freq: Four times a day (QID) | INTRAMUSCULAR | Status: DC | PRN

## 2011-09-05 MED ORDER — OXYCODONE HCL 5 MG PO TABS
5.0000 mg | ORAL_TABLET | ORAL | Status: DC | PRN
Start: 2011-09-05 — End: 2011-09-06

## 2011-09-05 MED ORDER — MORPHINE SULFATE 2 MG/ML IJ SOLN: 1.0000 mg | INTRAMUSCULAR | Status: DC | PRN

## 2011-09-05 MED ORDER — MIDAZOLAM HCL 5 MG/5ML IJ SOLN
INTRAMUSCULAR | Status: DC | PRN
  Administered 2011-09-05: 1 mg via INTRAVENOUS

## 2011-09-05 MED ORDER — PROPOFOL 10 MG/ML IV EMUL
INTRAVENOUS | Status: DC | PRN
  Administered 2011-09-05: 150 mg via INTRAVENOUS

## 2011-09-05 MED ORDER — ACETAMINOPHEN 10 MG/ML IV SOLN
INTRAVENOUS | Status: AC
  Filled 2011-09-05: qty 100

## 2011-09-05 MED ORDER — METHOCARBAMOL 100 MG/ML IJ SOLN
500.0000 mg | Freq: Four times a day (QID) | INTRAVENOUS | Status: DC | PRN
  Filled 2011-09-05: qty 5

## 2011-09-05 MED ORDER — LACTATED RINGERS IV SOLN
INTRAVENOUS | Status: DC
Start: 2011-09-05 — End: 2011-09-05

## 2011-09-05 MED ORDER — MUPIROCIN 2 % EX OINT
TOPICAL_OINTMENT | Freq: Once | CUTANEOUS | Status: DC
  Filled 2011-09-05: qty 22

## 2011-09-05 MED ORDER — METOCLOPRAMIDE HCL 5 MG/ML IJ SOLN: 5.0000 mg | Freq: Three times a day (TID) | INTRAMUSCULAR | Status: DC | PRN

## 2011-09-05 MED ORDER — ONDANSETRON HCL 4 MG PO TABS: 4.0000 mg | ORAL_TABLET | Freq: Four times a day (QID) | ORAL | Status: DC | PRN

## 2011-09-05 MED ORDER — CEFAZOLIN SODIUM 1-5 GM-% IV SOLN
1.0000 g | Freq: Four times a day (QID) | INTRAVENOUS | Status: DC
  Administered 2011-09-05 – 2011-09-06 (×2): 1 g via INTRAVENOUS
  Filled 2011-09-05 (×3): qty 50

## 2011-09-05 MED ORDER — FENTANYL CITRATE 0.05 MG/ML IJ SOLN
INTRAMUSCULAR | Status: DC | PRN
  Administered 2011-09-05 (×7): 25 ug via INTRAVENOUS
  Administered 2011-09-05: 50 ug via INTRAVENOUS
  Administered 2011-09-05: 25 ug via INTRAVENOUS

## 2011-09-05 MED ORDER — CEFAZOLIN SODIUM-DEXTROSE 2-3 GM-% IV SOLR
2.0000 g | INTRAVENOUS | Status: AC
  Administered 2011-09-05: 2 g via INTRAVENOUS

## 2011-09-05 MED ORDER — HYDROMORPHONE HCL PF 1 MG/ML IJ SOLN
0.2500 mg | INTRAMUSCULAR | Status: DC | PRN
  Administered 2011-09-05: 0.5 mg via INTRAVENOUS

## 2011-09-05 MED ORDER — CEFAZOLIN SODIUM-DEXTROSE 2-3 GM-% IV SOLR
INTRAVENOUS | Status: AC
  Filled 2011-09-05: qty 50

## 2011-09-05 SURGICAL SUPPLY — 59 items
BAG SPEC THK2 15X12 ZIP CLS (MISCELLANEOUS) ×1
BAG ZIPLOCK 12X15 (MISCELLANEOUS) ×2 IMPLANT
BANDAGE ELASTIC 3 VELCRO ST LF (GAUZE/BANDAGES/DRESSINGS) ×1 IMPLANT
BANDAGE ELASTIC 4 VELCRO ST LF (GAUZE/BANDAGES/DRESSINGS) ×1 IMPLANT
BIT DRILL 2 FAST STEP (BIT) ×1 IMPLANT
BIT DRILL 2.5X4 QC (BIT) ×1 IMPLANT
CLOTH BEACON ORANGE TIMEOUT ST (SAFETY) ×2 IMPLANT
CUFF TOURN SGL QUICK 18 (TOURNIQUET CUFF) ×2 IMPLANT
DRAPE C-ARM 42X72 X-RAY (DRAPES) ×2 IMPLANT
DRAPE U-SHAPE 47X51 STRL (DRAPES) ×2 IMPLANT
DRSG ADAPTIC 3X8 NADH LF (GAUZE/BANDAGES/DRESSINGS) ×2 IMPLANT
DRSG PAD ABDOMINAL 8X10 ST (GAUZE/BANDAGES/DRESSINGS) ×2 IMPLANT
DURAPREP 26ML APPLICATOR (WOUND CARE) ×2 IMPLANT
ELECT REM PT RETURN 9FT ADLT (ELECTROSURGICAL) ×2
ELECTRODE REM PT RTRN 9FT ADLT (ELECTROSURGICAL) ×1 IMPLANT
GAUZE XEROFORM 5X9 LF (GAUZE/BANDAGES/DRESSINGS) ×1 IMPLANT
GLOVE BIO SURGEON STRL SZ7.5 (GLOVE) ×2 IMPLANT
GOWN STRL REIN XL XLG (GOWN DISPOSABLE) ×2 IMPLANT
K-WIRE 1.6 (WIRE) ×2
K-WIRE FX5X1.6XNS BN SS (WIRE) ×1
KIT BASIN OR (CUSTOM PROCEDURE TRAY) ×2 IMPLANT
KWIRE FX5X1.6XNS BN SS (WIRE) IMPLANT
NS IRRIG 1000ML POUR BTL (IV SOLUTION) ×2 IMPLANT
PACK LOWER EXTREMITY WL (CUSTOM PROCEDURE TRAY) ×2 IMPLANT
PAD CAST 3X4 CTTN HI CHSV (CAST SUPPLIES) ×1 IMPLANT
PAD CAST 4YDX4 CTTN HI CHSV (CAST SUPPLIES) ×1 IMPLANT
PADDING CAST ABS 3INX4YD NS (CAST SUPPLIES) ×1
PADDING CAST ABS 4INX4YD NS (CAST SUPPLIES) ×1
PADDING CAST ABS COTTON 3X4 (CAST SUPPLIES) IMPLANT
PADDING CAST ABS COTTON 4X4 ST (CAST SUPPLIES) IMPLANT
PADDING CAST COTTON 3X4 STRL (CAST SUPPLIES) ×2
PADDING CAST COTTON 4X4 STRL (CAST SUPPLIES) ×2
PEG SUBCHONDRAL SMOOTH 2.0X18 (Peg) ×3 IMPLANT
PEG SUBCHONDRAL SMOOTH 2.0X20 (Peg) ×1 IMPLANT
PEG SUBCHONDRAL SMOOTH 2.0X22 (Peg) ×3 IMPLANT
PEG SUBCHONDRAL SMOOTH 2.0X24 (Peg) ×1 IMPLANT
PLATE F3 FRAG HI STR (Plate) ×1 IMPLANT
PLATE SHORT 24.4X51.3 LT (Plate) ×1 IMPLANT
POSITIONER SURGICAL ARM (MISCELLANEOUS) ×2 IMPLANT
SCREW BN 12X3.5XNS CORT TI (Screw) IMPLANT
SCREW CORT 3.5X10 LNG (Screw) ×1 IMPLANT
SCREW CORT 3.5X12 (Screw) ×6 IMPLANT
SCREW MULTI DIRECT 14MM (Screw) ×1 IMPLANT
SCREW PEG 2.5X12 NONLOCK (Screw) ×1 IMPLANT
SCREW PEG LOCK 2.5X12 (Screw) ×2 IMPLANT
SPLINT PLASTER EXTRA FAST 3X15 (CAST SUPPLIES) ×1
SPLINT PLASTER GYPS XFAST 3X15 (CAST SUPPLIES) IMPLANT
SPONGE GAUZE 4X4 12PLY (GAUZE/BANDAGES/DRESSINGS) ×2 IMPLANT
STRIP CLOSURE SKIN 1/2X4 (GAUZE/BANDAGES/DRESSINGS) ×2 IMPLANT
SUT ETHILON 3 0 PS 1 (SUTURE) ×2 IMPLANT
SUT MNCRL AB 4-0 PS2 18 (SUTURE) ×2 IMPLANT
SUT VIC AB 0 CT1 27 (SUTURE) ×4
SUT VIC AB 0 CT1 27XBRD ANTBC (SUTURE) ×2 IMPLANT
SUT VIC AB 2-0 CT1 27 (SUTURE) ×2
SUT VIC AB 2-0 CT1 TAPERPNT 27 (SUTURE) ×1 IMPLANT
TOWEL OR 17X26 10 PK STRL BLUE (TOWEL DISPOSABLE) ×4 IMPLANT
TOWEL OR NON WOVEN STRL DISP B (DISPOSABLE) ×2 IMPLANT
UNDERPAD 30X30 INCONTINENT (UNDERPADS AND DIAPERS) ×2 IMPLANT
WATER STERILE IRR 1500ML POUR (IV SOLUTION) ×2 IMPLANT

## 2011-09-05 NOTE — Preoperative (Signed)
Beta Blockers   Reason not to administer Beta Blockers:Not Applicable 

## 2011-09-05 NOTE — H&P (Signed)
[name] is an 77 y.o. female.   Chief Complaint:    Severe left wrist pain HPI:   77 yo female post mechanical fall sustaining an unstable left distal radius and distal ulna fracture.  This was reduced in the ER in [place] and she was sent here to [facility] for definitive treatment.  She understands fully the reasoning behind treating her fracture with surgery.  Past Medical History  Diagnosis Date  . High cholesterol     Past Surgical History  Procedure Date  . Tubal ligation   . Back surgery     History reviewed. No pertinent family history. Social History:  reports that she has never smoked. She does not have any smokeless tobacco history on file. She reports that she does not drink alcohol or use illicit drugs.  Allergies: No Known Allergies  Medications Prior to Admission  Medication Sig Dispense Refill  . Bioflavonoid Products (ESTER C PO) Take 1 tablet by mouth every morning.      . cholecalciferol (VITAMIN D) 1000 UNITS tablet Take 2,000 Units by mouth daily.      . Coenzyme Q10 (CO Q-10 PO) Take 1 capsule by mouth at bedtime. 200 mg (over the counter)      . HYDROcodone-acetaminophen (NORCO) 7.5-325 MG per tablet Take 1 tablet by mouth every 4 (four) hours as needed for pain (must last 10 days between refills.).  40 tablet  2  . ibuprofen (ADVIL,MOTRIN) 200 MG tablet Take 200 mg by mouth every 6 (six) hours as needed.      . Multiple Vitamin (MULTIVITAMIN WITH MINERALS) TABS Take 1 tablet by mouth daily.      . simvastatin (ZOCOR) 40 MG tablet Take 40 mg by mouth at bedtime.      Marland Kitchen oxyCODONE-acetaminophen (PERCOCET/ROXICET) 5-325 MG per tablet Take 1 tablet by mouth every 6 (six) hours as needed for pain.  20 tablet  0    No results found for this or any previous visit (from the past 48 hour(s)). No results found.  Review of Systems  All other systems reviewed and are negative.    Blood pressure 122/86, pulse 70, temperature 98.3 F (36.8 C), resp. rate  20, height 5\' 3"  (1.6 m), weight 46.777 kg (103 lb 2 oz), SpO2 97.00%. Physical Exam  Constitutional: She is oriented to person, place, and time. She appears well-developed and well-nourished.  HENT:  Head: Normocephalic and atraumatic.  Eyes: EOM are normal. Pupils are equal, round, and reactive to light.  Neck: Normal range of motion. Neck supple.  Cardiovascular: Normal rate and regular rhythm.   Respiratory: Effort normal and breath sounds normal.  GI: Soft. Bowel sounds are normal.  Musculoskeletal:       Left wrist: She exhibits bony tenderness, swelling and deformity.  Neurological: She is alert and oriented to person, place, and time.  Skin: Skin is warm and dry.  Psychiatric: She has a normal mood and affect.     Assessment/Plan Displaced unstable distal radius and distal ulna fracture 1) to the OR today for surgical fixation of her broken wrist fracture  BLACKMAN,CHRISTOPHER Y 09/05/2011, 2:37 PM

## 2011-09-05 NOTE — Brief Op Note (Signed)
09/05/2011  6:51 PM  PATIENT:  [name]  77 y.o. female  PRE-OPERATIVE DIAGNOSIS:  Left distal radius fracture and distal ulna fracture  POST-OPERATIVE DIAGNOSIS:  left distal radius fracture and distal ulna fracture  PROCEDURE:  Procedure(s) (LRB): OPEN REDUCTION INTERNAL FIXATION (ORIF) DISTAL RADIAL FRACTURE (Left) AND DISTAL ULNA FRACTURE (Left)  SURGEON:  Surgeon(s) and Role:    * Kathryne Hitch, MD - Primary  PHYSICIAN ASSISTANT:   ASSISTANTS: none   ANESTHESIA:   general  EBL:  Total I/O In: -  Out: 50 [Blood:50]  BLOOD ADMINISTERED:none  DRAINS: none   LOCAL MEDICATIONS USED:  NONE  SPECIMEN:  No Specimen  DISPOSITION OF SPECIMEN:  N/A  COUNTS:  YES  TOURNIQUET:   Total Tourniquet Time Documented: Upper Arm (Left) - 82 minutes  DICTATION: .Other Dictation: Dictation Number [id number]  PLAN OF CARE: Admit for overnight observation  PATIENT DISPOSITION:  PACU - hemodynamically stable.   Delay start of Pharmacological VTE agent (>24hrs) due to surgical blood loss or risk of bleeding: not applicable

## 2011-09-05 NOTE — Anesthesia Preprocedure Evaluation (Addendum)
Anesthesia Evaluation  Patient identified by MRN, date of birth, ID band Patient awake    Reviewed: Allergy & Precautions, H&P , NPO status , Patient's Chart, lab work & pertinent test results  Airway Mallampati: II TM Distance: >3 FB Neck ROM: full    Dental  (+) Dental Advisory Given, Caps and Missing,    Pulmonary neg pulmonary ROS,  breath sounds clear to auscultation  Pulmonary exam normal       Cardiovascular Exercise Tolerance: Good negative cardio ROS  Rhythm:regular Rate:Normal     Neuro/Psych negative neurological ROS  negative psych ROS   GI/Hepatic negative GI ROS, Neg liver ROS,   Endo/Other  negative endocrine ROS  Renal/GU negative Renal ROS  negative genitourinary   Musculoskeletal   Abdominal   Peds  Hematology negative hematology ROS (+)   Anesthesia Other Findings   Reproductive/Obstetrics negative OB ROS                          Anesthesia Physical Anesthesia Plan  ASA: I  Anesthesia Plan: General   Post-op Pain Management:    Induction: Intravenous  Airway Management Planned: LMA  Additional Equipment:   Intra-op Plan:   Post-operative Plan:   Informed Consent: I have reviewed the patients History and Physical, chart, labs and discussed the procedure including the risks, benefits and alternatives for the proposed anesthesia with the patient or authorized representative who has indicated his/her understanding and acceptance.   Dental Advisory Given  Plan Discussed with: CRNA and Surgeon  Anesthesia Plan Comments:         Anesthesia Quick Evaluation

## 2011-09-05 NOTE — Anesthesia Postprocedure Evaluation (Signed)
  Anesthesia Post-op Note  Patient: [name]  Procedure(s) Performed: Procedure(s) (LRB): OPEN REDUCTION INTERNAL FIXATION (ORIF) DISTAL RADIAL FRACTURE (Left)  Patient Location: PACU  Anesthesia Type: General  Level of Consciousness: awake and alert   Airway and Oxygen Therapy: Patient Spontanous Breathing  Post-op Pain: mild  Post-op Assessment: Post-op Vital signs reviewed, Patient's Cardiovascular Status Stable, Respiratory Function Stable, Patent Airway and No signs of Nausea or vomiting  Post-op Vital Signs: stable  Complications: No apparent anesthesia complications

## 2011-09-05 NOTE — Transfer of Care (Signed)
Immediate Anesthesia Transfer of Care Note  Patient: [name]  Procedure(s) Performed: Procedure(s) (LRB): OPEN REDUCTION INTERNAL FIXATION (ORIF) DISTAL RADIAL FRACTURE (Left)  Patient Location: PACU  Anesthesia Type: General  Level of Consciousness: sedated, patient cooperative and responds to stimulaton  Airway & Oxygen Therapy: Patient Spontanous Breathing and Patient connected to face mask oxgen  Post-op Assessment: Report given to PACU RN and Post -op Vital signs reviewed and stable  Post vital signs: Reviewed and stable  Complications: No apparent anesthesia complications

## 2011-09-06 MED ORDER — HYDROCODONE-ACETAMINOPHEN 5-325 MG PO TABS: 1.0000 | ORAL_TABLET | ORAL | Status: AC | PRN

## 2011-09-06 NOTE — Discharge Summary (Signed)
Patient ID: [name] MRN: [id number] DOB/AGE: 1934-02-15 77 y.o.  Admit date: 09/05/2011 Discharge date: 09/06/2011  Admission Diagnoses:  Principal Problem:  *Distal radius fracture, left   Discharge Diagnoses:  Same  Past Medical History  Diagnosis Date  . High cholesterol     Surgeries: Procedure(s): OPEN REDUCTION INTERNAL FIXATION (ORIF) DISTAL RADIAL FRACTURE on 09/05/2011   Consultants:    Discharged Condition: Improved  Hospital Course: [name] is an 77 y.o. female who was admitted 09/05/2011 for operative treatment ofDistal radius fracture, left. Patient has severe unremitting pain that affects sleep, daily activities, and work/hobbies. After pre-op clearance the patient was taken to the operating room on 09/05/2011 and underwent  Procedure(s): OPEN REDUCTION INTERNAL FIXATION (ORIF) DISTAL RADIAL FRACTURE.    Patient was given perioperative antibiotics: Anti-infectives     Start     Dose/Rate Route Frequency Ordered Stop   09/05/11 2300   ceFAZolin (ANCEF) IVPB 1 g/50 mL premix        1 g 100 mL/hr over 30 Minutes Intravenous Every 6 hours 09/05/11 2030 09/06/11 1659   09/05/11 1409   ceFAZolin (ANCEF) IVPB 2 g/50 mL premix        2 g 100 mL/hr over 30 Minutes Intravenous 60 min pre-op 09/05/11 1409 09/05/11 1700           Patient was given sequential compression devices, early ambulation, and chemoprophylaxis to prevent DVT.  Patient benefited maximally from hospital stay and there were no complications.    Recent vital signs: Patient Vitals for the past 24 hrs:  BP Temp Temp src Pulse Resp SpO2 Height Weight  09/06/11 0645 111/70 mmHg 98.1 F (36.7 C) Oral 79  16  91 % - -  09/06/11 0100 92/57 mmHg 98.1 F (36.7 C) Oral 75  16  94 % - -  09/05/11 2326 102/63 mmHg 98.2 F (36.8 C) Oral 69  16  99 % 5\' 3"  (1.6 m) 46.72 kg (103 lb)  09/05/11 2128 105/64 mmHg 98.4 F (36.9 C) - 66  16  100 % - -  09/05/11 2021 115/69 mmHg 98.3 F (36.8  C) - 75  16  100 % - -  09/05/11 2000 111/61 mmHg - - 67  14  100 % - -  09/05/11 1945 128/74 mmHg 98.3 F (36.8 C) - 73  16  100 % - -  09/05/11 1930 125/64 mmHg - - 78  21  100 % - -  09/05/11 1915 125/69 mmHg - - 82  22  100 % - -  09/05/11 1900 124/90 mmHg 97.7 F (36.5 C) - 89  13  100 % - -  09/05/11 1410 122/86 mmHg 98.3 F (36.8 C) - 70  20  97 % 5\' 3"  (1.6 m) 46.777 kg (103 lb 2 oz)     Recent laboratory studies: No results found for this basename: WBC:2,HGB:2,HCT:2,PLT:2,NA:2,K:2,CL:2,CO2:2,BUN:2,CREATININE:2,GLUCOSE:2,PT:2,INR:2,CALCIUM,2: in the last 72 hours   Discharge Medications:   Medication List  As of 09/06/2011  8:59 AM   TAKE these medications         cholecalciferol 1000 UNITS tablet   Commonly known as: VITAMIN D   Take 2,000 Units by mouth daily.      CO Q-10 PO   Take 1 capsule by mouth at bedtime. 200 mg (over the counter)      ESTER C PO   Take 1 tablet by mouth every morning.      HYDROcodone-acetaminophen 5-325 MG per tablet  Commonly known as: NORCO/VICODIN   Take 1-2 tablets by mouth every 4 (four) hours as needed.      ibuprofen 200 MG tablet   Commonly known as: ADVIL,MOTRIN   Take 200 mg by mouth every 6 (six) hours as needed. Pain      multivitamin with minerals Tabs   Take 1 tablet by mouth daily.      simvastatin 40 MG tablet   Commonly known as: ZOCOR   Take 40 mg by mouth at bedtime.            Diagnostic Studies: Dg Wrist Complete Left  09/17/2011  *RADIOLOGY REPORT*  Clinical Data: Wrist fractures post reduction  LEFT WRIST - COMPLETE 3+ VIEW  Comparison: 09-17-11  Findings: Plaster splint material obscures bony detail. Improved alignment of distal radial and ulnar fractures post reduction. Restoration of mild volar tilt of distal radial articular surface. Mild persistent dorsal displacement of distal radial fragment. Previously seen angulations appear reduced. No additional focal bony abnormalities identified.  IMPRESSION:  Improved alignments of distal left radial and ulnar fractures post reduction.   Original Report Authenticated By: Lollie Marrow, M.D.    Dg Wrist Complete Left  Sep 17, 2011  *RADIOLOGY REPORT*  Clinical Data: Traumatic injury and pain  LEFT WRIST - COMPLETE 3+ VIEW  Comparison: None.  Findings: There is a comminuted fracture of the distal left radius of the impaction there is a mildly displaced distal ulnar fracture as well.  and posterior angulation at the fracture site.  IMPRESSION: Distal left radial and ulnar fractures as described.   Original Report Authenticated By: Phillips Odor, M.D.     Disposition: 01-Home or Self Care  Discharge Orders    Future Orders Please Complete By Expires   Wrist splint      Comments:   velcro wrist splint for left wrist prior to discharge   Diet - low sodium heart healthy      Care order/instruction      Scheduling Instructions:   Have ortho tech place velcro wrist splint prior to discharge on left wrist. thanks   Call MD / Call 911      Comments:   If you experience chest pain or shortness of breath, CALL 911 and be transported to the hospital emergency room.  If you develope a fever above 101 F, pus (white drainage) or increased drainage or redness at the wound, or calf pain, call your surgeon's office.   Constipation Prevention      Comments:   Drink plenty of fluids.  Prune juice may be helpful.  You may use a stool softener, such as Colace (over the counter) 100 mg twice a day.  Use MiraLax (over the counter) for constipation as needed.   Increase activity slowly as tolerated      Discharge instructions      Comments:   Ice and elevation for left wrist. Wait 5 days before getting your incisions wet in the shower. You can come out of your wrist splint as needed to move your wrist and for hygiene purposes. Follow-up with Dr. Magnus Ivan at [facility] in 2 weeks. Call with questions/concerns [phone])   Discharge patient             Signed: Kathryne Hitch 09/06/2011, 8:59 AM

## 2011-09-06 NOTE — Progress Notes (Signed)
Discharged from floor via w/c, spouse with pt. No changes in assessment. [name]   

## 2011-09-06 NOTE — Op Note (Signed)
NAMEBRENLEY, PRIORE NO.:  [id number]  MEDICAL RECORD NO.:  [id number]  LOCATION:  1616                         FACILITY:  [facility]  PHYSICIAN:  Vanita Panda. Magnus Ivan, M.D.DATE OF BIRTH:  1934/11/10  DATE OF PROCEDURE:  09/05/2011 DATE OF DISCHARGE:                              OPERATIVE REPORT   PREOPERATIVE DIAGNOSIS:  Left unstable distal radius and distal ulnar fracture.  POSTOPERATIVE DIAGNOSIS:  Left unstable distal radius and distal ulnar fracture.  PROCEDURE:  Open reduction and internal fixation of left unstable distal radius and distal ulnar fracture with plating of both the radius and the ulna.  IMPLANTS:  [employer] DVR plate for the distal radius and [employer] F3 plate for the ulna.  SURGEON:  Doneen Poisson MD.  ANESTHESIA:  General.  ESTIMATED BLOOD LOSS:  Minimal.  TOURNIQUET TIME:  Less than 2 hours.  COMPLICATIONS:  None.  INDICATIONS:  Ms. [name] is a very pleasant 77 year old right-hand dominant female who fell in her garden on the 27th of August.  She had a displaced distal radius and distal ulnar fracture that was quite unstable.  These were closed reduced by Dr. Hilda Lias of [facility] in [place].  This was done at [facility], but due to the unstable nature of the ulna as well as the radius, it was recommended she undergo open reduction and internal fixation.  She is going to have [facility] for this.  She understands the risks and benefits of the surgery and the reason behind proceeding with surgery due to the unstable nature of her broken wrist.  Risks and benefits of the surgery were explained to her in detail and her husband. They do wish to proceed with surgical.  DETAILS OF PROCEDURE:  After informed consent was obtained and appropriate left arm was marked, she was brought to the operating, placed supine on the operating table and laid her left arm on  the radiolucent arm table.  General anesthesia was then obtained.  A nonsterile tourniquet was placed around her upper left arm.  Her left arm was prepped and draped in the fingers to the upper arm with DuraPrep and sterile drapes.  A time-out was called.  She was identified as the correct patient, correct left wrist.  I then used Esmarch to wrap out the wrist, and tourniquet was inflated to 250 mm pressure.  I then took a standard volar approach to the wrist and dissected down between the interval of the flexor carpi radialis and radial artery, and identified the fracture easily.  There was significant comminution of the fracture site and displacement, and it appeared extra-articular.  Once I was able to reduce the fracture in place, I placed a standard distal radial volar locking plate from [employer] on the volar cortex and secured this with distal locking pegs and proximal bicortical screws.  The fracture completely reduced.  Once this was accomplished, attention was turned to the ulna.  I made our incision on the ulnar border of the ulna and was able to dissect down of this transverse fracture and reduce the ulna in an anatomic position.  I then chose a F3 plate with 2 proximal and 2  distal holes to secure the ulna.  I then cleaned both wounds with normal saline solution and closed the deep tissue with 2-0 Vicryl followed by interrupted 3-0 nylon on the skin on both incisions.  The tourniquet was let down.  The fingers pinked nicely.  Well-padded volar plaster splint was applied.  She was awakened, extubated, and taken to the recovery room in stable condition.  All final counts were correct.  There were no complications noted.     Vanita Panda. Magnus Ivan, M.D.     CYB/MEDQ  D:  09/05/2011  T:  09/06/2011  Job:  [id number]

## 2011-09-06 NOTE — Progress Notes (Signed)
Patient ID: [name], female   DOB: 1934/03/15, 77 y.o.   MRN: [id number] Doing well.  Wrist/hand well-perfused.  Moderate pain.  Moving her fingers.  Can d/c to home today.

## 2011-09-17 DIAGNOSIS — S52599B Other fractures of lower end of unspecified radius, initial encounter for open fracture type I or II: Secondary | ICD-10-CM | POA: Diagnosis not present

## 2011-10-15 DIAGNOSIS — S52539A Colles' fracture of unspecified radius, initial encounter for closed fracture: Secondary | ICD-10-CM | POA: Diagnosis not present

## 2011-10-15 DIAGNOSIS — S52599B Other fractures of lower end of unspecified radius, initial encounter for open fracture type I or II: Secondary | ICD-10-CM | POA: Diagnosis not present

## 2011-10-30 DIAGNOSIS — Z23 Encounter for immunization: Secondary | ICD-10-CM | POA: Diagnosis not present

## 2011-11-19 DIAGNOSIS — S52599B Other fractures of lower end of unspecified radius, initial encounter for open fracture type I or II: Secondary | ICD-10-CM | POA: Diagnosis not present

## 2011-11-19 DIAGNOSIS — S52539A Colles' fracture of unspecified radius, initial encounter for closed fracture: Secondary | ICD-10-CM | POA: Diagnosis not present

## 2012-01-01 DIAGNOSIS — H43819 Vitreous degeneration, unspecified eye: Secondary | ICD-10-CM | POA: Diagnosis not present

## 2012-01-01 DIAGNOSIS — H35439 Paving stone degeneration of retina, unspecified eye: Secondary | ICD-10-CM | POA: Diagnosis not present

## 2012-01-01 DIAGNOSIS — H353190 Nonexudative age-related macular degeneration, unspecified eye, stage unspecified: Secondary | ICD-10-CM | POA: Diagnosis not present

## 2012-01-01 DIAGNOSIS — H2589 Other age-related cataract: Secondary | ICD-10-CM | POA: Diagnosis not present

## 2012-01-14 DIAGNOSIS — IMO0002 Reserved for concepts with insufficient information to code with codable children: Secondary | ICD-10-CM | POA: Diagnosis not present

## 2012-01-15 ENCOUNTER — Other Ambulatory Visit: Payer: Self-pay | Admitting: Ophthalmology

## 2012-01-15 DIAGNOSIS — IMO0002 Reserved for concepts with insufficient information to code with codable children: Secondary | ICD-10-CM

## 2012-01-21 ENCOUNTER — Ambulatory Visit
Admission: RE | Admit: 2012-01-21 | Discharge: 2012-01-21 | Disposition: A | Discharge status: Home / Self Care | Payer: MEDICARE | Source: Ambulatory Visit | Attending: Ophthalmology | Admitting: Ophthalmology

## 2012-01-21 DIAGNOSIS — IMO0002 Reserved for concepts with insufficient information to code with codable children: Secondary | ICD-10-CM

## 2012-01-21 DIAGNOSIS — I671 Cerebral aneurysm, nonruptured: Secondary | ICD-10-CM | POA: Diagnosis not present

## 2012-01-21 MED ORDER — GADOBENATE DIMEGLUMINE 529 MG/ML IV SOLN
9.0000 mL | Freq: Once | INTRAVENOUS | Status: AC | PRN
  Administered 2012-01-21: 9 mL via INTRAVENOUS

## 2012-01-22 ENCOUNTER — Encounter (HOSPITAL_COMMUNITY): Payer: Self-pay | Admitting: *Deleted

## 2012-01-22 ENCOUNTER — Other Ambulatory Visit: Payer: Self-pay | Admitting: Neurosurgery

## 2012-01-22 ENCOUNTER — Encounter (HOSPITAL_COMMUNITY): Payer: Self-pay | Admitting: Pharmacy Technician

## 2012-01-22 DIAGNOSIS — I671 Cerebral aneurysm, nonruptured: Secondary | ICD-10-CM | POA: Diagnosis not present

## 2012-01-23 ENCOUNTER — Encounter (HOSPITAL_COMMUNITY): Payer: Self-pay | Admitting: Anesthesiology

## 2012-01-23 ENCOUNTER — Inpatient Hospital Stay (HOSPITAL_COMMUNITY): Payer: MEDICARE

## 2012-01-23 ENCOUNTER — Encounter (HOSPITAL_COMMUNITY): Payer: Self-pay | Admitting: Surgery

## 2012-01-23 ENCOUNTER — Inpatient Hospital Stay (HOSPITAL_COMMUNITY): Payer: MEDICARE | Admitting: Anesthesiology

## 2012-01-23 ENCOUNTER — Encounter (HOSPITAL_COMMUNITY)
Admission: RE | Disposition: A | Discharge status: Home / Self Care | Payer: Self-pay | Source: Ambulatory Visit | Attending: Neurosurgery

## 2012-01-23 ENCOUNTER — Inpatient Hospital Stay (HOSPITAL_COMMUNITY)
Admission: RE | Admit: 2012-01-23 | Discharge: 2012-01-28 | DRG: 027 | Disposition: A | Discharge status: Home / Self Care | Payer: MEDICARE | Source: Ambulatory Visit | Attending: Neurosurgery | Admitting: Neurosurgery

## 2012-01-23 ENCOUNTER — Encounter (HOSPITAL_COMMUNITY): Payer: Self-pay | Admitting: Radiology

## 2012-01-23 DIAGNOSIS — Z452 Encounter for adjustment and management of vascular access device: Secondary | ICD-10-CM | POA: Diagnosis not present

## 2012-01-23 DIAGNOSIS — H4900 Third [oculomotor] nerve palsy, unspecified eye: Secondary | ICD-10-CM | POA: Diagnosis present

## 2012-01-23 DIAGNOSIS — R110 Nausea: Secondary | ICD-10-CM | POA: Diagnosis not present

## 2012-01-23 DIAGNOSIS — I6200 Nontraumatic subdural hemorrhage, unspecified: Secondary | ICD-10-CM | POA: Diagnosis not present

## 2012-01-23 DIAGNOSIS — I671 Cerebral aneurysm, nonruptured: Secondary | ICD-10-CM | POA: Diagnosis not present

## 2012-01-23 DIAGNOSIS — Z79899 Other long term (current) drug therapy: Secondary | ICD-10-CM

## 2012-01-23 DIAGNOSIS — Z01818 Encounter for other preprocedural examination: Secondary | ICD-10-CM | POA: Diagnosis not present

## 2012-01-23 HISTORY — DX: Hyperlipidemia, unspecified: E78.5

## 2012-01-23 HISTORY — DX: Calculus of kidney: N20.0

## 2012-01-23 HISTORY — PX: CRANIOTOMY: SHX93

## 2012-01-23 HISTORY — DX: Diplopia: H53.2

## 2012-01-23 LAB — CBC
HCT: 42.8 % (ref 36.0–46.0)
MCV: 94.5 fL (ref 78.0–100.0)
RBC: 4.53 MIL/uL (ref 3.87–5.11)
RDW: 12.9 % (ref 11.5–15.5)
WBC: 6.5 10*3/uL (ref 4.0–10.5)

## 2012-01-23 LAB — ABO/RH: ABO/RH(D): B NEG

## 2012-01-23 LAB — PREPARE RBC (CROSSMATCH)

## 2012-01-23 LAB — BASIC METABOLIC PANEL
BUN: 15 mg/dL (ref 6–23)
Calcium: 8.7 mg/dL (ref 8.4–10.5)
Chloride: 108 mEq/L (ref 96–112)
Creatinine, Ser: 0.58 mg/dL (ref 0.50–1.10)
GFR calc Af Amer: >90 mL/min (ref >90)

## 2012-01-23 LAB — PROTIME-INR: Prothrombin Time: 13.7 seconds (ref 11.6–15.2)

## 2012-01-23 SURGERY — CRANIOTOMY INTRACRANIAL ANEURYSM FOR CAROTID
Anesthesia: General | Site: Head | Laterality: Left | Wound class: Clean

## 2012-01-23 MED ORDER — ONDANSETRON HCL 4 MG/2ML IJ SOLN
INTRAMUSCULAR | Status: DC | PRN
  Administered 2012-01-23: 4 mg via INTRAVENOUS

## 2012-01-23 MED ORDER — SODIUM CHLORIDE 0.9 % IV SOLN
500.0000 mg | Freq: Two times a day (BID) | INTRAVENOUS | Status: DC
  Filled 2012-01-23: qty 5

## 2012-01-23 MED ORDER — OXYCODONE HCL 5 MG/5ML PO SOLN: 5.0000 mg | Freq: Once | ORAL | Status: DC | PRN

## 2012-01-23 MED ORDER — HYDROMORPHONE HCL PF 1 MG/ML IJ SOLN: 0.2500 mg | INTRAMUSCULAR | Status: DC | PRN

## 2012-01-23 MED ORDER — DEXAMETHASONE 6 MG PO TABS
6.0000 mg | ORAL_TABLET | Freq: Four times a day (QID) | ORAL | Status: DC
  Filled 2012-01-23 (×3): qty 1

## 2012-01-23 MED ORDER — HEPARIN SODIUM (PORCINE) 1000 UNIT/ML IJ SOLN
500.0000 [IU] | Freq: Once | INTRAMUSCULAR | Status: AC
  Administered 2012-01-23: 500 [IU] via INTRAVENOUS
  Filled 2012-01-23: qty 0.5

## 2012-01-23 MED ORDER — ROCURONIUM BROMIDE 100 MG/10ML IV SOLN
INTRAVENOUS | Status: DC | PRN
  Administered 2012-01-23: 50 mg via INTRAVENOUS

## 2012-01-23 MED ORDER — LABETALOL HCL 5 MG/ML IV SOLN
INTRAVENOUS | Status: DC | PRN
  Administered 2012-01-23 (×2): 10 mg via INTRAVENOUS

## 2012-01-23 MED ORDER — LACTATED RINGERS IV SOLN
INTRAVENOUS | Status: DC | PRN
  Administered 2012-01-23 (×2): via INTRAVENOUS

## 2012-01-23 MED ORDER — VECURONIUM BROMIDE 10 MG IV SOLR
INTRAVENOUS | Status: DC | PRN
  Administered 2012-01-23: 2 mg via INTRAVENOUS

## 2012-01-23 MED ORDER — VITAMIN D3 25 MCG (1000 UNIT) PO TABS
2000.0000 [IU] | ORAL_TABLET | Freq: Every day | ORAL | Status: DC
  Administered 2012-01-24 – 2012-01-28 (×4): 2000 [IU] via ORAL
  Filled 2012-01-23 (×7): qty 2

## 2012-01-23 MED ORDER — PANTOPRAZOLE SODIUM 40 MG IV SOLR
40.0000 mg | Freq: Every day | INTRAVENOUS | Status: DC
  Administered 2012-01-23 – 2012-01-25 (×3): 40 mg via INTRAVENOUS
  Filled 2012-01-23 (×5): qty 40

## 2012-01-23 MED ORDER — MUPIROCIN 2 % EX OINT
TOPICAL_OINTMENT | Freq: Two times a day (BID) | CUTANEOUS | Status: DC
  Administered 2012-01-23 (×2): 1 via NASAL
  Administered 2012-01-24 – 2012-01-26 (×5): via NASAL
  Administered 2012-01-26: 1 via NASAL
  Administered 2012-01-27 – 2012-01-28 (×3): via NASAL
  Filled 2012-01-23: qty 22

## 2012-01-23 MED ORDER — CEFAZOLIN SODIUM-DEXTROSE 2-3 GM-% IV SOLR
2.0000 g | INTRAVENOUS | Status: AC
  Administered 2012-01-23: 2 g via INTRAVENOUS
  Filled 2012-01-23: qty 50

## 2012-01-23 MED ORDER — POTASSIUM CHLORIDE IN NACL 20-0.9 MEQ/L-% IV SOLN
INTRAVENOUS | Status: DC
  Administered 2012-01-23: 21:00:00 via INTRAVENOUS
  Administered 2012-01-24: 80 mL/h via INTRAVENOUS
  Administered 2012-01-24: 23:00:00 via INTRAVENOUS
  Filled 2012-01-23 (×7): qty 1000

## 2012-01-23 MED ORDER — HYDROCODONE-ACETAMINOPHEN 5-325 MG PO TABS
1.0000 | ORAL_TABLET | ORAL | Status: DC | PRN
  Administered 2012-01-24 – 2012-01-27 (×3): 1 via ORAL
  Filled 2012-01-23 (×3): qty 1

## 2012-01-23 MED ORDER — MANNITOL 25 % IV SOLN
50.0000 g | Freq: Once | INTRAVENOUS | Status: DC
  Filled 2012-01-23: qty 200

## 2012-01-23 MED ORDER — DEXAMETHASONE SODIUM PHOSPHATE 4 MG/ML IJ SOLN
6.0000 mg | Freq: Four times a day (QID) | INTRAMUSCULAR | Status: AC
  Administered 2012-01-23 – 2012-01-24 (×2): 6 mg via INTRAVENOUS
  Filled 2012-01-23 (×2): qty 1
  Filled 2012-01-23: qty 2

## 2012-01-23 MED ORDER — HEMOSTATIC AGENTS (NO CHARGE) OPTIME
TOPICAL | Status: DC | PRN
  Administered 2012-01-23: 1 via TOPICAL

## 2012-01-23 MED ORDER — ETOMIDATE 2 MG/ML IV SOLN
INTRAVENOUS | Status: DC | PRN
  Administered 2012-01-23: 10 mg via INTRAVENOUS

## 2012-01-23 MED ORDER — MANNITOL 25 % IV SOLN
12.5000 g | Freq: Once | INTRAVENOUS | Status: AC
  Administered 2012-01-23: 12.5 g via INTRAVENOUS
  Filled 2012-01-23: qty 50

## 2012-01-23 MED ORDER — PROPOFOL 10 MG/ML IV BOLUS
INTRAVENOUS | Status: DC | PRN
  Administered 2012-01-23: 120 mg via INTRAVENOUS
  Administered 2012-01-23 (×2): 40 mg via INTRAVENOUS

## 2012-01-23 MED ORDER — BACITRACIN ZINC 500 UNIT/GM EX OINT
TOPICAL_OINTMENT | CUTANEOUS | Status: DC | PRN
  Administered 2012-01-23: 1 via TOPICAL

## 2012-01-23 MED ORDER — MORPHINE SULFATE 2 MG/ML IJ SOLN
1.0000 mg | INTRAMUSCULAR | Status: DC | PRN
  Administered 2012-01-23 (×2): 2 mg via INTRAVENOUS
  Filled 2012-01-23 (×2): qty 1

## 2012-01-23 MED ORDER — THROMBIN 20000 UNITS EX SOLR
CUTANEOUS | Status: DC | PRN
  Administered 2012-01-23: 15:00:00 via TOPICAL

## 2012-01-23 MED ORDER — DEXAMETHASONE 4 MG PO TABS: 4.0000 mg | ORAL_TABLET | Freq: Four times a day (QID) | ORAL | Status: DC

## 2012-01-23 MED ORDER — BISACODYL 5 MG PO TBEC
5.0000 mg | DELAYED_RELEASE_TABLET | Freq: Every day | ORAL | Status: DC | PRN
  Filled 2012-01-23 (×2): qty 1

## 2012-01-23 MED ORDER — OXYCODONE HCL 5 MG PO TABS: 5.0000 mg | ORAL_TABLET | Freq: Once | ORAL | Status: DC | PRN

## 2012-01-23 MED ORDER — LEVETIRACETAM 500 MG/5ML IV SOLN
500.0000 mg | Freq: Two times a day (BID) | INTRAVENOUS | Status: DC
  Administered 2012-01-23 – 2012-01-27 (×8): 500 mg via INTRAVENOUS
  Filled 2012-01-23 (×9): qty 5

## 2012-01-23 MED ORDER — GLYCOPYRROLATE 0.2 MG/ML IJ SOLN
INTRAMUSCULAR | Status: DC | PRN
  Administered 2012-01-23: .8 mg via INTRAVENOUS

## 2012-01-23 MED ORDER — DEXAMETHASONE SODIUM PHOSPHATE 4 MG/ML IJ SOLN
4.0000 mg | Freq: Three times a day (TID) | INTRAMUSCULAR | Status: AC
  Administered 2012-01-25: 4 mg via INTRAVENOUS
  Filled 2012-01-23: qty 1

## 2012-01-23 MED ORDER — SENNA 8.6 MG PO TABS
1.0000 | ORAL_TABLET | Freq: Two times a day (BID) | ORAL | Status: DC
  Administered 2012-01-24 – 2012-01-28 (×6): 8.6 mg via ORAL
  Filled 2012-01-23 (×12): qty 1

## 2012-01-23 MED ORDER — LIDOCAINE-EPINEPHRINE 0.5 %-1:200000 IJ SOLN
INTRAMUSCULAR | Status: DC | PRN
  Administered 2012-01-23: 10 mL

## 2012-01-23 MED ORDER — ONDANSETRON HCL 4 MG PO TABS: 4.0000 mg | ORAL_TABLET | ORAL | Status: DC | PRN

## 2012-01-23 MED ORDER — IOHEXOL 350 MG/ML SOLN
50.0000 mL | Freq: Once | INTRAVENOUS | Status: AC | PRN
  Administered 2012-01-23: 50 mL via INTRAVENOUS

## 2012-01-23 MED ORDER — ADULT MULTIVITAMIN W/MINERALS CH
1.0000 | ORAL_TABLET | Freq: Every day | ORAL | Status: DC
  Administered 2012-01-24 – 2012-01-28 (×4): 1 via ORAL
  Filled 2012-01-23 (×7): qty 1

## 2012-01-23 MED ORDER — ONDANSETRON HCL 4 MG/2ML IJ SOLN
4.0000 mg | INTRAMUSCULAR | Status: DC | PRN
  Administered 2012-01-23 – 2012-01-27 (×2): 4 mg via INTRAVENOUS
  Filled 2012-01-23: qty 2

## 2012-01-23 MED ORDER — DEXAMETHASONE SODIUM PHOSPHATE 4 MG/ML IJ SOLN
10.0000 mg | Freq: Once | INTRAMUSCULAR | Status: AC
  Administered 2012-01-23: 10 mg via INTRAVENOUS

## 2012-01-23 MED ORDER — CO Q-10 200 MG PO CAPS: 200.0000 mg | ORAL_CAPSULE | Freq: Every evening | ORAL | Status: DC

## 2012-01-23 MED ORDER — DEXAMETHASONE 4 MG PO TABS: 4.0000 mg | ORAL_TABLET | Freq: Three times a day (TID) | ORAL | Status: DC

## 2012-01-23 MED ORDER — SODIUM CHLORIDE 0.9 % IV SOLN
INTRAVENOUS | Status: DC | PRN
  Administered 2012-01-23 (×2): via INTRAVENOUS

## 2012-01-23 MED ORDER — SIMVASTATIN 40 MG PO TABS
40.0000 mg | ORAL_TABLET | Freq: Every day | ORAL | Status: DC
  Administered 2012-01-24 – 2012-01-27 (×4): 40 mg via ORAL
  Filled 2012-01-23 (×6): qty 1

## 2012-01-23 MED ORDER — LEVETIRACETAM 500 MG/5ML IV SOLN: 500.0000 mg | Freq: Two times a day (BID) | INTRAVENOUS | Status: DC

## 2012-01-23 MED ORDER — ARTIFICIAL TEARS OP OINT
TOPICAL_OINTMENT | OPHTHALMIC | Status: DC | PRN
  Administered 2012-01-23: 1 via OPHTHALMIC

## 2012-01-23 MED ORDER — DEXAMETHASONE SODIUM PHOSPHATE 4 MG/ML IJ SOLN
4.0000 mg | Freq: Four times a day (QID) | INTRAMUSCULAR | Status: AC
  Administered 2012-01-24 – 2012-01-25 (×4): 4 mg via INTRAVENOUS
  Filled 2012-01-23 (×4): qty 1

## 2012-01-23 MED ORDER — ACETAMINOPHEN 10 MG/ML IV SOLN
1000.0000 mg | Freq: Four times a day (QID) | INTRAVENOUS | Status: AC
  Administered 2012-01-23 – 2012-01-24 (×4): 1000 mg via INTRAVENOUS
  Filled 2012-01-23 (×6): qty 100

## 2012-01-23 MED ORDER — PROMETHAZINE HCL 25 MG/ML IJ SOLN: 6.2500 mg | INTRAMUSCULAR | Status: DC | PRN

## 2012-01-23 MED ORDER — OXYCODONE HCL 5 MG PO TABS: 5.0000 mg | ORAL_TABLET | ORAL | Status: DC | PRN

## 2012-01-23 MED ORDER — SENNOSIDES-DOCUSATE SODIUM 8.6-50 MG PO TABS
1.0000 | ORAL_TABLET | Freq: Every evening | ORAL | Status: DC | PRN
  Filled 2012-01-23 (×2): qty 1

## 2012-01-23 MED ORDER — FENTANYL CITRATE 0.05 MG/ML IJ SOLN
INTRAMUSCULAR | Status: DC | PRN
  Administered 2012-01-23: 50 ug via INTRAVENOUS
  Administered 2012-01-23: 100 ug via INTRAVENOUS
  Administered 2012-01-23 (×3): 50 ug via INTRAVENOUS

## 2012-01-23 MED ORDER — ONDANSETRON HCL 4 MG/2ML IJ SOLN
INTRAMUSCULAR | Status: AC
  Filled 2012-01-23: qty 2

## 2012-01-23 MED ORDER — 0.9 % SODIUM CHLORIDE (POUR BTL) OPTIME
TOPICAL | Status: DC | PRN
  Administered 2012-01-23 (×3): 1000 mL

## 2012-01-23 MED ORDER — NEOSTIGMINE METHYLSULFATE 1 MG/ML IJ SOLN
INTRAMUSCULAR | Status: DC | PRN
  Administered 2012-01-23: 5 mg via INTRAVENOUS

## 2012-01-23 MED ORDER — MAGNESIUM CITRATE PO SOLN
1.0000 | Freq: Once | ORAL | Status: AC | PRN
  Filled 2012-01-23: qty 296

## 2012-01-23 MED ORDER — NALOXONE HCL 0.4 MG/ML IJ SOLN: 0.0800 mg | INTRAMUSCULAR | Status: DC | PRN

## 2012-01-23 MED ORDER — LABETALOL HCL 5 MG/ML IV SOLN: 10.0000 mg | INTRAVENOUS | Status: DC | PRN

## 2012-01-23 MED ORDER — PROMETHAZINE HCL 12.5 MG PO TABS
12.5000 mg | ORAL_TABLET | ORAL | Status: DC | PRN
  Filled 2012-01-23 (×2): qty 2

## 2012-01-23 SURGICAL SUPPLY — 106 items
APL SKNCLS STERI-STRIP NONHPOA (GAUZE/BANDAGES/DRESSINGS)
BANDAGE GAUZE 4  KLING STR (GAUZE/BANDAGES/DRESSINGS) ×1 IMPLANT
BANDAGE GAUZE ELAST BULKY 4 IN (GAUZE/BANDAGES/DRESSINGS) ×2 IMPLANT
BENZOIN TINCTURE PRP APPL 2/3 (GAUZE/BANDAGES/DRESSINGS) IMPLANT
BIT DRILL WIRE PASS 1.3MM (BIT) IMPLANT
BLADE EYE SICKLE 84 5 BEAV (BLADE) ×1 IMPLANT
BLADE SAW GIGLI 16 STRL (MISCELLANEOUS) IMPLANT
BLADE ULTRA TIP 2M (BLADE) IMPLANT
BRUSH SCRUB EZ 1% IODOPHOR (MISCELLANEOUS) ×1 IMPLANT
BRUSH SCRUB EZ PLAIN DRY (MISCELLANEOUS) ×1 IMPLANT
BUR ACORN 6.0 PRECISION (BURR) ×2 IMPLANT
BUR ADDG 1.1 (BURR) IMPLANT
BUR MATCHSTICK NEURO 3.0 LAGG (BURR) ×1 IMPLANT
BUR ROUTER D-58 CRANI (BURR) ×1 IMPLANT
CANISTER SUCTION 2500CC (MISCELLANEOUS) ×4 IMPLANT
CLIP TI MEDIUM 6 (CLIP) IMPLANT
CLOTH BEACON ORANGE TIMEOUT ST (SAFETY) ×2 IMPLANT
CONT SPEC 4OZ CLIKSEAL STRL BL (MISCELLANEOUS) ×2 IMPLANT
CORDS BIPOLAR (ELECTRODE) ×2 IMPLANT
Clip Aneury TI Perm STD Str 11mm ×1 IMPLANT
Clip Aneury TI Perm Std Str 20mm ×1 IMPLANT
Clip Aneury TI Perm StdAng 11.4mm ×1 IMPLANT
Clip Aneury Ti Temp STD STR 9mm ×1 IMPLANT
DECANTER SPIKE VIAL GLASS SM (MISCELLANEOUS) ×2 IMPLANT
DRAIN SNY WOU 7FLT (WOUND CARE) IMPLANT
DRAPE MICROSCOPE LEICA (MISCELLANEOUS) ×2 IMPLANT
DRAPE NEUROLOGICAL W/INCISE (DRAPES) ×2 IMPLANT
DRAPE WARM FLUID 44X44 (DRAPE) ×2 IMPLANT
DRESSING TELFA 8X3 (GAUZE/BANDAGES/DRESSINGS) IMPLANT
DRILL WIRE PASS 1.3MM (BIT)
DURAPREP 6ML APPLICATOR 50/CS (WOUND CARE) ×2 IMPLANT
ELECT CAUTERY BLADE 6.4 (BLADE) ×1 IMPLANT
ELECT REM PT RETURN 9FT ADLT (ELECTROSURGICAL) ×2
ELECTRODE REM PT RTRN 9FT ADLT (ELECTROSURGICAL) ×1 IMPLANT
EVACUATOR SILICONE 100CC (DRAIN) IMPLANT
GAUZE SPONGE 4X4 16PLY XRAY LF (GAUZE/BANDAGES/DRESSINGS) IMPLANT
GLOVE BIO SURGEON STRL SZ 6.5 (GLOVE) IMPLANT
GLOVE BIO SURGEON STRL SZ7 (GLOVE) IMPLANT
GLOVE BIO SURGEON STRL SZ7.5 (GLOVE) IMPLANT
GLOVE BIO SURGEON STRL SZ8 (GLOVE) ×2 IMPLANT
GLOVE BIO SURGEON STRL SZ8.5 (GLOVE) IMPLANT
GLOVE BIOGEL M 8.0 STRL (GLOVE) IMPLANT
GLOVE BIOGEL PI IND STRL 7.0 (GLOVE) IMPLANT
GLOVE BIOGEL PI INDICATOR 7.0 (GLOVE) ×2
GLOVE ECLIPSE 6.5 STRL STRAW (GLOVE) ×4 IMPLANT
GLOVE ECLIPSE 7.0 STRL STRAW (GLOVE) IMPLANT
GLOVE ECLIPSE 7.5 STRL STRAW (GLOVE) IMPLANT
GLOVE ECLIPSE 8.0 STRL XLNG CF (GLOVE) IMPLANT
GLOVE ECLIPSE 8.5 STRL (GLOVE) IMPLANT
GLOVE EXAM NITRILE LRG STRL (GLOVE) IMPLANT
GLOVE EXAM NITRILE MD LF STRL (GLOVE) IMPLANT
GLOVE EXAM NITRILE XL STR (GLOVE) IMPLANT
GLOVE EXAM NITRILE XS STR PU (GLOVE) IMPLANT
GLOVE INDICATOR 6.5 STRL GRN (GLOVE) IMPLANT
GLOVE INDICATOR 7.0 STRL GRN (GLOVE) ×1 IMPLANT
GLOVE INDICATOR 7.5 STRL GRN (GLOVE) IMPLANT
GLOVE INDICATOR 8.0 STRL GRN (GLOVE) IMPLANT
GLOVE INDICATOR 8.5 STRL (GLOVE) IMPLANT
GLOVE OPTIFIT SS 8.0 STRL (GLOVE) IMPLANT
GLOVE SURG SS PI 6.5 STRL IVOR (GLOVE) ×4 IMPLANT
GLOVE SURG SS PI 7.0 STRL IVOR (GLOVE) ×3 IMPLANT
GOWN BRE IMP SLV AUR LG STRL (GOWN DISPOSABLE) ×4 IMPLANT
GOWN BRE IMP SLV AUR XL STRL (GOWN DISPOSABLE) ×4 IMPLANT
GOWN STRL REIN 2XL LVL4 (GOWN DISPOSABLE) IMPLANT
HEMOSTAT SURGICEL 2X14 (HEMOSTASIS) ×2 IMPLANT
HOOK DURA (MISCELLANEOUS) ×2 IMPLANT
KIT BASIN OR (CUSTOM PROCEDURE TRAY) ×2 IMPLANT
KIT DRAIN CSF ACCUDRAIN (MISCELLANEOUS) IMPLANT
KIT ROOM TURNOVER OR (KITS) ×2 IMPLANT
KNIFE ARACHNOID DISP AM-24-S (MISCELLANEOUS) ×1 IMPLANT
NDL HYPO 25X1 1.5 SAFETY (NEEDLE) ×1 IMPLANT
NEEDLE HYPO 25X1 1.5 SAFETY (NEEDLE) ×2 IMPLANT
NS IRRIG 1000ML POUR BTL (IV SOLUTION) ×3 IMPLANT
PACK CRANIOTOMY (CUSTOM PROCEDURE TRAY) ×2 IMPLANT
PAD ARMBOARD 7.5X6 YLW CONV (MISCELLANEOUS) ×4 IMPLANT
PATTIES SURGICAL .25X.25 (GAUZE/BANDAGES/DRESSINGS) IMPLANT
PATTIES SURGICAL .5 X.5 (GAUZE/BANDAGES/DRESSINGS) IMPLANT
PATTIES SURGICAL .5 X3 (DISPOSABLE) IMPLANT
PATTIES SURGICAL 1/4 X 3 (GAUZE/BANDAGES/DRESSINGS) IMPLANT
PATTIES SURGICAL 1X1 (DISPOSABLE) IMPLANT
PIN MAYFIELD SKULL DISP (PIN) ×1 IMPLANT
PLATE 1.5  2HOLE LNG NEURO (Plate) ×3 IMPLANT
PLATE 1.5  2HOLE MED NEURO (Plate) ×1 IMPLANT
PLATE 1.5 2HOLE LNG NEURO (Plate) IMPLANT
PLATE 1.5 2HOLE MED NEURO (Plate) IMPLANT
RUBBERBAND STERILE (MISCELLANEOUS) ×4 IMPLANT
SCREW SELF DRILL HT 1.5/4MM (Screw) ×8 IMPLANT
SPONGE GAUZE 4X4 12PLY (GAUZE/BANDAGES/DRESSINGS) ×2 IMPLANT
SPONGE NEURO XRAY DETECT 1X3 (DISPOSABLE) IMPLANT
SPONGE SURGIFOAM ABS GEL 100 (HEMOSTASIS) IMPLANT
SPONGE SURGIFOAM ABS GEL 100C (HEMOSTASIS) ×2 IMPLANT
STAPLER SKIN PROX WIDE 3.9 (STAPLE) ×2 IMPLANT
SUT ETHILON 3 0 FSL (SUTURE) IMPLANT
SUT NURALON 4 0 TR CR/8 (SUTURE) ×5 IMPLANT
SUT VIC AB 2-0 CT2 18 VCP726D (SUTURE) ×6 IMPLANT
SUT VIC AB 3-0 SH 8-18 (SUTURE) IMPLANT
SYR 20ML ECCENTRIC (SYRINGE) ×2 IMPLANT
SYR CONTROL 10ML LL (SYRINGE) ×2 IMPLANT
TAPE SURG TRANSPORE 1 IN (GAUZE/BANDAGES/DRESSINGS) IMPLANT
TAPE SURGICAL TRANSPORE 1 IN (GAUZE/BANDAGES/DRESSINGS) ×1
TOWEL OR 17X24 6PK STRL BLUE (TOWEL DISPOSABLE) ×2 IMPLANT
TOWEL OR 17X26 10 PK STRL BLUE (TOWEL DISPOSABLE) ×2 IMPLANT
TRAY FOLEY CATH 14FRSI W/METER (CATHETERS) ×1 IMPLANT
TUBE CONNECTING 12X1/4 (SUCTIONS) ×1 IMPLANT
UNDERPAD 30X30 INCONTINENT (UNDERPADS AND DIAPERS) IMPLANT
WATER STERILE IRR 1000ML POUR (IV SOLUTION) ×2 IMPLANT

## 2012-01-23 NOTE — H&P (Signed)
BP 157/78  Pulse 66  Temp 97.7 F (36.5 C) (Oral)  Resp 18  SpO2 100% Cc: Left vertical diplopia, ptosis, Unruptured cerebral aneurysm HPI: [name] is a 78 y.o. female with 4-5 week history of diplopia, ptosis, and a feeling of pressure behind the left eye. MRI done this week shows what is a probable aneurysm in the left posterior communicating aneurysm territory. She denies headache, seizures, or other neurologic problems besides the eye. No Known Allergies Prior to Admission medications   Medication Sig Start Date End Date Taking? Authorizing Provider  Bioflavonoid Products (ESTER C PO) Take 1 tablet by mouth every morning.   Yes Historical Provider, MD  cholecalciferol (VITAMIN D) 1000 UNITS tablet Take 2,000 Units by mouth daily.   Yes Historical Provider, MD  Coenzyme Q10 (CO Q-10 PO) Take 1 capsule by mouth at bedtime. 200 mg (over the counter)   Yes Historical Provider, MD  ibuprofen (ADVIL,MOTRIN) 200 MG tablet Take 200 mg by mouth every 6 (six) hours as needed. Pain   Yes Historical Provider, MD  Multiple Vitamin (MULTIVITAMIN WITH MINERALS) TABS Take 1 tablet by mouth daily.   Yes Historical Provider, MD  simvastatin (ZOCOR) 40 MG tablet Take 40 mg by mouth at bedtime.   Yes Historical Provider, MD   History  Substance Use Topics  . Smoking status: Never Smoker   . Smokeless tobacco: Not on file     Comment: quit over 20 years ago.  . Alcohol Use: No   Past Surgical History  Procedure Date  . Tubal ligation   . Back surgery   . Left wrist     fracture 02/2011   History reviewed. No pertinent family history. Lab Results  Component Value Date   WBC 6.5 01/23/2012   HGB 14.0 01/23/2012   HCT 42.8 01/23/2012   MCV 94.5 01/23/2012   PLT 252 01/23/2012   Results for orders placed during the hospital encounter of 01/23/12 (from the past 24 hour(s))  CBC     Status: Normal   Collection Time   01/23/12  7:17 AM      Component Value Range   WBC 6.5  4.0 - 10.5 K/uL     RBC 4.53  3.87 - 5.11 MIL/uL   Hemoglobin 14.0  12.0 - 15.0 g/dL   HCT 45.4  09.8 - 11.9 %   MCV 94.5  78.0 - 100.0 fL   MCH 30.9  26.0 - 34.0 pg   MCHC 32.7  30.0 - 36.0 g/dL   RDW 14.7  82.9 - 56.2 %   Platelets 252  150 - 400 K/uL  SURGICAL PCR SCREEN     Status: Normal   Collection Time   01/23/12  7:23 AM      Component Value Range   MRSA, PCR NEGATIVE  NEGATIVE   Staphylococcus aureus NEGATIVE  NEGATIVE  BASIC METABOLIC PANEL     Status: Abnormal   Collection Time   01/23/12 11:45 AM      Component Value Range   Sodium 144  135 - 145 mEq/L   Potassium 3.7  3.5 - 5.1 mEq/L   Chloride 108  96 - 112 mEq/L   CO2 28  19 - 32 mEq/L   Glucose, Bld 98  70 - 99 mg/dL   BUN 15  6 - 23 mg/dL   Creatinine, Ser 1.30  0.50 - 1.10 mg/dL   Calcium 8.7  8.4 - 86.5 mg/dL   GFR calc non Af [name]  88 (*) >90 mL/min   GFR calc Af Amer >90  >90 mL/min  PROTIME-INR     Status: Normal   Collection Time   01/23/12 11:45 AM      Component Value Range   Prothrombin Time 13.7  11.6 - 15.2 seconds   INR 1.06  0.00 - 1.49  APTT     Status: Normal   Collection Time   01/23/12 11:45 AM      Component Value Range   aPTT 28  24 - 37 seconds  TYPE AND SCREEN     Status: Normal (Preliminary result)   Collection Time   01/23/12 11:45 AM      Component Value Range   ABO/RH(D) B NEG     Antibody Screen NEG     Sample Expiration 01/26/2012     Unit Number [id number]     Blood Component Type RED CELLS,LR     Unit division 00     Status of Unit ISSUED     Transfusion Status OK TO TRANSFUSE     Crossmatch Result Compatible     Unit Number [id number]     Blood Component Type RED CELLS,LR     Unit division 00     Status of Unit ISSUED     Transfusion Status OK TO TRANSFUSE     Crossmatch Result Compatible    PREPARE RBC (CROSSMATCH)     Status: Normal   Collection Time   01/23/12 11:45 AM      Component Value Range   Order Confirmation ORDER PROCESSED BY BLOOD BANK    ABO/RH     Status:  Normal   Collection Time   01/23/12 11:45 AM      Component Value Range   ABO/RH(D) B NEG     Physical Exam: Alert and oriented x 4 Speech is clear and fluent, pupils equal, round, reactive. Right lateral gaze, vertical gaze diplopia Ptosis left eye Symmetric facial movements and sensation Tongue protrudes in the midline, uvula elevates in the midline Normal shoulder shrug No drift 5/5 strength No cervical masses or bruits Lung fields clear Heart regular, rhythm, and rate Pulses good at the wrists A/P This is a woman with evidence of an expanding aneurysm causing a Left lll nerve palsy. Or for aneurysm clipping. Risks including bleeding, infection, stroke, coma, death, paralysis, change in mentation, change in personality, aphasia, bowel/and or bladder dysfunction, and others. She understands the risks and would like to proceed.

## 2012-01-23 NOTE — Transfer of Care (Signed)
Immediate Anesthesia Transfer of Care Note  Patient: [name]  Procedure(s) Performed: Procedure(s) (LRB) with comments: CRANIOTOMY INTRACRANIAL ANEURYSM FOR CAROTID (Left) - Left Pterional Craniotomy for aneurysm clipping  Patient Location: PACU  Anesthesia Type:General  Level of Consciousness: awake and patient cooperative  Airway & Oxygen Therapy: Patient Spontanous Breathing and Patient connected to nasal cannula oxygen  Post-op Assessment: Report given to PACU RN, Post -op Vital signs reviewed and stable and Patient moving all extremities  Post vital signs: Reviewed and stable  Complications: No apparent anesthesia complications

## 2012-01-23 NOTE — Op Note (Addendum)
01/23/2012  6:37 PM  PATIENT:  [name]  78 y.o. female with a new 3rd nerve palsy on the left. MRI identified a likely posterior communicating artery aneurysm confirmed on CTA today. She is taken to the or for an urgent craniotomy for aneurysm clipping  PRE-OPERATIVE DIAGNOSIS:  Cerebral aneurysm, Left posterior communicating artery  POST-OPERATIVE DIAGNOSIS:  Cerebral aneurysm, Left posterior communicating artery  PROCEDURE:  Procedure(s): CRANIOTOMY INTRACRANIAL for Posterior communicating artery ANEURYSM clipping Microdissection using operative microscope   SURGEON:  Surgeon(s): Carmela Hurt, MD Tia Alert, MD  ASSISTANTS:JOnes, Onalee Hua  ANESTHESIA:   general  EBL:  Total I/O In: 2000 [I.V.:2000] Out: 1075 [Urine:875; Blood:200]  BLOOD ADMINISTERED:none  CELL SAVER GIVEN:none       COUNT:per nursing  DRAINS: none   SPECIMEN:  No Specimen  DICTATION: Mrs. Towle was taken to the operating room intubated and placed under a general anesthetic without difficulty. A foley catheter was placed under sterile conditions. After adequate anesthesia was obtained a three pin [place] head holder was placed. I then shaved and prepped her head. She was positioned supine with her head turned towards the right side, brow up in slight extension. Using a planned standard pterional incision, I infiltrated the scalp with lidocaine. She was draped in a sterile fashion. I opened the skin with a 10 blade along the hairline on the left side. I placed raney clips along the scalp edges. In standard [place] fashion I developed the flap and the temporalis muscle flap. I reflected this rostrally and exposed the pterion.  I turned the craniotomy by placing burr holes, one in the pterion, the other in the temporal bone. I turned the flap with a craniotome and elevated the bone flap without difficulty. I placed dural tackup sutures around the edges. I opened the dura basing the flap anteriorly.  After exposing the frontal and temporal lobes I started my microdissection of the sylvian fissure using the microscope. I divided the sylvian fissure after opening the arachnoid with a blade. I used the bipoloar cautery to then from lateral to medial, and deep to superficial separate the frontal and temporal lobes. I dissected over the middle cerebral artery until reaching the bifurcation. I opened the basal cisterns along the carotid artery, and the optic nerve to release csf. With greater brain relaxation I then exposed the Pcomm aneurysm. I carefully retracted the frontal lobe and with Dr. Yetta Barre' assistance we fully dissected around the aneurysm. We were able to identify a small vessel just distal to the aneurysm. At this time, I placed a straight clip across the neck of the aneurysm. It appeared with microdissection to be across the neck completely. I made a small opening in the aneurysm dome and there was still pulsatile flow. This was easily controlled with a  7 rhoton suction, and secured with a piece of Gelfoam and cottonoid patty. . At that time I decided to place a temporary clip across the proximal ICA to soften the aneurysm. During the temporary clip placement we removed the straight clip and used an angled clip to come around the neck in line with the ICA. I preserved the adjacent vessel, and was able to see the lll nerve. We did collapse the aneurysm and clearly it was clipped and fully controlled. I irrigated the wound then closed.  I approximated the dura with interrupted suture. I replaced the bone flap with plates and screws. I approximated the temporalis muscle and fascia. I approximated the scalp with  subgaleal sutures and staples for the scalp edges.  I then  placed a sterile dressing.   PLAN OF CARE: Admit to inpatient   PATIENT DISPOSITION:  PACU - hemodynamically stable.   Delay start of Pharmacological VTE agent (>24hrs) due to surgical blood loss or risk of bleeding:  yes

## 2012-01-23 NOTE — Preoperative (Signed)
Beta Blockers   Reason not to administer Beta Blockers:Not Applicable 

## 2012-01-23 NOTE — Progress Notes (Signed)
PHARMACIST - PHYSICIAN ORDER COMMUNICATION  CONCERNING: P&T Medication Policy on Herbal Medications  DESCRIPTION:  This patient's order for: Co Q 10 has been noted.  This product(s) is classified as an "herbal" or natural product. Due to a lack of definitive safety studies or FDA approval, nonstandard manufacturing practices, plus the potential risk of unknown drug-drug interactions while on inpatient medications, the Pharmacy and Therapeutics Committee does not permit the use of "herbal" or natural products of this type within [facility].   ACTION TAKEN: The pharmacy department is unable to verify this order at this time and your patient has been informed of this safety policy. Please reevaluate patient's clinical condition at discharge and address if the herbal or natural product(s) should be resumed at that time.   [employer], Pharm.D., BCPS Clinical Pharmacist Pager [phone] 01/23/2012 8:25 PM

## 2012-01-23 NOTE — Anesthesia Postprocedure Evaluation (Signed)
  Anesthesia Post-op Note  Patient: [name]  Procedure(s) Performed: Procedure(s) (LRB) with comments: CRANIOTOMY INTRACRANIAL ANEURYSM FOR CAROTID (Left) - Left Pterional Craniotomy for aneurysm clipping  Patient Location: PACU  Anesthesia Type:General  Level of Consciousness: awake  Airway and Oxygen Therapy: Patient Spontanous Breathing and Patient connected to nasal cannula oxygen  Post-op Pain: none  Post-op Assessment: Post-op Vital signs reviewed  Post-op Vital Signs: Reviewed  Complications: No apparent anesthesia complications

## 2012-01-23 NOTE — Progress Notes (Signed)
Patient ID: [name], female   DOB: December 31, 1934, 78 y.o.   MRN: [id number] BP 122/58  Pulse 76  Temp 98 F (36.7 C) (Axillary)  Resp 22  Ht 5\' 5"  (1.651 m)  Wt 46.9 kg (103 lb 6.3 oz)  BMI 17.21 kg/m2  SpO2 100% Lethargic, follows commands Moving all extremities Dressing dry. Doing well.

## 2012-01-23 NOTE — Anesthesia Preprocedure Evaluation (Addendum)
Anesthesia Evaluation    Reviewed: Allergy & Precautions, H&P , NPO status , Patient's Chart, lab work & pertinent test results  History of Anesthesia Complications Negative for: history of anesthetic complications  Airway       Dental   Pulmonary neg pulmonary ROS,          Cardiovascular negative cardio ROS      Neuro/Psych negative psych ROS   GI/Hepatic negative GI ROS, Neg liver ROS,   Endo/Other  negative endocrine ROS  Renal/GU negative Renal ROS     Musculoskeletal   Abdominal   Peds  Hematology   Anesthesia Other Findings   Reproductive/Obstetrics                          Anesthesia Physical Anesthesia Plan  ASA: III  Anesthesia Plan: General   Post-op Pain Management:    Induction: Intravenous  Airway Management Planned: Oral ETT  Additional Equipment: Arterial line and CVP  Intra-op Plan:   Post-operative Plan: Possible Post-op intubation/ventilation  Informed Consent:   Plan Discussed with:   Anesthesia Plan Comments:         Anesthesia Quick Evaluation

## 2012-01-24 MED ORDER — SODIUM CHLORIDE 0.9 % IV BOLUS (SEPSIS)
1000.0000 mL | Freq: Once | INTRAVENOUS | Status: AC
  Administered 2012-01-24: 1000 mL via INTRAVENOUS

## 2012-01-24 NOTE — Evaluation (Signed)
Physical Therapy Evaluation Patient Details Name: [name] MRN: [id number] DOB: 10-01-1934 Today's Date: 01/24/2012 Time: 4098-1191 PT Time Calculation (min): 15 min  PT Assessment / Plan / Recommendation Clinical Impression  Pt s/p L pterional craniotomy with aneurysm clipping. Pt still presenting with double vision and dizziness limiting mobility. Will continue to reassess in further sessions. Pt will benefit from skilled PT in the acute care setting in order to maximize functional mobiltiy and return to independence prior to d/c home    PT Assessment  Patient needs continued PT services    Follow Up Recommendations  No PT follow up;Supervision for mobility/OOB    Does the patient have the potential to tolerate intense rehabilitation      Barriers to Discharge        Equipment Recommendations  None recommended by PT    Recommendations for Other Services     Frequency Min 3X/week    Precautions / Restrictions Precautions Precautions: Other (comment) (crani) Restrictions Weight Bearing Restrictions: No   Pertinent Vitals/Pain No complaints of pain      Mobility  Bed Mobility Bed Mobility: Supine to Sit;Sitting - Scoot to Edge of Bed Supine to Sit: 6: Modified independent (Device/Increase time) Sitting - Scoot to Edge of Bed: 6: Modified independent (Device/Increase time) Transfers Transfers: Sit to Stand;Stand to Sit Sit to Stand: 4: Min assist;With upper extremity assist;From bed Stand to Sit: 4: Min assist;With upper extremity assist;To chair/3-in-1 Stand Pivot Transfers: 4: Min assist Details for Transfer Assistance: Min assist for support throughout transfer. Cueing for safe technique throughout.  Ambulation/Gait Ambulation/Gait Assistance: Not tested (comment)    Shoulder Instructions     Exercises     PT Diagnosis: Difficulty walking;Acute pain  PT Problem List: Decreased mobility;Decreased activity tolerance;Decreased safety awareness;Decreased  knowledge of precautions PT Treatment Interventions: DME instruction;Gait training;Stair training;Functional mobility training;Therapeutic activities;Balance training;Patient/family education   PT Goals Acute Rehab PT Goals PT Goal Formulation: With patient Time For Goal Achievement: 01/31/12 Potential to Achieve Goals: Good Pt will go Sit to Stand: Independently PT Goal: Sit to Stand - Progress: Goal set today Pt will go Stand to Sit: Independently PT Goal: Stand to Sit - Progress: Goal set today Pt will Transfer Bed to Chair/Chair to Bed: with modified independence PT Transfer Goal: Bed to Chair/Chair to Bed - Progress: Goal set today Pt will Ambulate: >150 feet;with modified independence;with least restrictive assistive device PT Goal: Ambulate - Progress: Goal set today Pt will Go Up / Down Stairs: 1-2 stairs;with min assist PT Goal: Up/Down Stairs - Progress: Goal set today  Visit Information  Last PT Received On: 01/24/12 Assistance Needed: +1 (2 for lines and leads)    Subjective Data      Prior Functioning  Home Living Lives With: Spouse Available Help at Discharge: Family;Available 24 hours/day Type of Home: House Home Access: Stairs to enter [employer] of Steps: 1 Entrance Stairs-Rails: None Home Layout: One level Bathroom Shower/Tub: Tub/shower unit;Door [employer] Toilet: Standard Bathroom Accessibility: Yes How Accessible: Accessible via walker Home Adaptive Equipment: Walker - rolling Prior Function Level of Independence: Independent Able to Take Stairs?: Yes Driving: Yes Vocation: Retired Musician: No difficulties Dominant Hand: Right    Cognition  Overall Cognitive Status: Appears within functional limits for tasks assessed/performed Arousal/Alertness: Awake/alert Orientation Level: Appears intact for tasks assessed Behavior During Session: [facility] for tasks performed    Extremity/Trunk Assessment Right Lower Extremity  Assessment RLE ROM/Strength/Tone: Within functional levels RLE Sensation: WFL - Light Touch Left  Lower Extremity Assessment LLE ROM/Strength/Tone: Within functional levels LLE Sensation: WFL - Light Touch   Balance    End of Session PT - End of Session Equipment Utilized During Treatment: Gait belt Activity Tolerance: Other (comment) (limited secondary to dizziness) Patient left: in chair;with call bell/phone within reach;with nursing in room Nurse Communication: Mobility status  GP     Milana Kidney 01/24/2012, 4:51 PM  01/24/2012 Milana Kidney DPT PAGER: [phone] OFFICE: [phone]

## 2012-01-24 NOTE — Progress Notes (Signed)
Md notified of pts BP of 79/44 (52); Orders given for 1L bolus.   Cyndie Chime [place]

## 2012-01-24 NOTE — Progress Notes (Signed)
Patient ID: [name], female   DOB: 05/23/1934, 78 y.o.   MRN: [id number] Patient looks really good on postop day 1. He is having some low blood pressure but is asymptomatic and not tachycardic. I will bolus her gently. Dressing is dry. Mild third nerve palsy on the left though she feels her double vision is somewhat better. Gaze is mildly disconjugate and she may have some mild ptosis on the left though this could be simply swelling from surgery. She is awake and alert and conversant and follows commands and moves all extremities. I am pleased with how she looks. She denies headache.

## 2012-01-24 NOTE — Progress Notes (Signed)
Patient continues to have low BP 75/40.  Changed BP cuff to small cuff and switched arms.  BP slightly higher 95/50.  ART Line removed.  Will continue to assess

## 2012-01-25 NOTE — Progress Notes (Signed)
Patient ID: [name], female   DOB: 03-20-1934, 78 y.o.   MRN: [id number] Subjective: Patient reports mild headache, mild double vision.  Objective: Vital signs in last 24 hours: Temp:  [98.3 F (36.8 C)-98.8 F (37.1 C)] 98.3 F (36.8 C) (01/19 0400) Pulse Rate:  [57-88] 57  (01/19 0700) Resp:  [18-24] 24  (01/19 0700) BP: (75-111)/(29-68) 103/61 mmHg (01/19 0700) SpO2:  [93 %-98 %] 95 % (01/19 0700) Arterial Line BP: (104)/(41) 104/41 mmHg (01/18 0800) Weight:  [49.2 kg (108 lb 7.5 oz)] 49.2 kg (108 lb 7.5 oz) (01/19 0400)  Intake/Output from previous day: 01/18 0701 - 01/19 0700 In: 2120 [P.O.:80; I.V.:1840; IV Piggyback:200] Out: 2410 [Urine:2410] Intake/Output this shift:    Neurologic: Mental status: Alert, oriented, thought content appropriate Cranial nerves: II: pupils equal, round, reactive to light and accommodation, III,VII: ptosis Left, III,IV,VI: extraocular muscles medial rectus reduced on the left, lateral rectus normal bilaterally, superior rectus abnormal on the left, VII: upper facial muscle function normal bilaterally, VII: lower facial muscle function normal bilaterally Motor: Normal  Lab Results: Lab Results  Component Value Date   WBC 6.5 01/23/2012   HGB 14.0 01/23/2012   HCT 42.8 01/23/2012   MCV 94.5 01/23/2012   PLT 252 01/23/2012   Lab Results  Component Value Date   INR 1.06 01/23/2012   BMET Lab Results  Component Value Date   NA 144 01/23/2012   K 3.7 01/23/2012   CL 108 01/23/2012   CO2 28 01/23/2012   GLUCOSE 98 01/23/2012   BUN 15 01/23/2012   CREATININE 0.58 01/23/2012   CALCIUM 8.7 01/23/2012    Studies/Results: Ct Angio Head W/cm &/or Wo Cm  01/23/2012  *RADIOLOGY REPORT*  Clinical Data:  78 year old female preoperative study for surgery for 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  Left vertical strabismus.  CT ANGIOGRAPHY HEAD  Technique:  Multidetector CT imaging of the head was performed using the standard protocol  during bolus administration of intravenous contrast.  Multiplanar CT image reconstructions including MIPs were obtained to evaluate the vascular anatomy.  Contrast: 50mL OMNIPAQUE IOHEXOL 350 MG/ML SOLN  Comparison:  Brain and orbit MRI 01/21/2012.  Findings:  Stable cerebral volume.  No ventriculomegaly. Gray-white matter differentiation is within normal limits throughout the brain.  No evidence of cortically based acute infarction identified.  No acute intracranial hemorrhage identified.  No midline shift or significant mass effect.  Enhancing hyperdensity at the medial left middle cranial fossa related to the known aneurysm.  See vascular findings below.  No other abnormal intracranial enhancement identified.  Visualized paranasal sinuses and mastoids are clear.  No acute osseous abnormality identified.  Postoperative changes to the globes.  Negative scalp soft tissues.  Vascular Findings: Major intracranial venous structures are enhancing.  Mildly dominant distal left vertebral artery.  No distal vertebral artery stenosis.  Normal PICA vessels.  Mildly tortuous, otherwise normal vertebrobasilar junction.  Basilar artery is mildly ectatic without stenosis.  The left posterior communicating artery aneurysm mildly abuts the distal left basilar just below the left SCA origin.  Incidental duplicated right SCA.  PCA origins are within normal limits.  The right posterior communicating artery is diminutive or absent.  Bilateral PCA branches are within normal limits.  Mild dolichoectasia of the distal cervical ICA is and both ICA siphons, more so the left.  Mild calcified atherosclerosis in the cavernous and supraclinoid segment without significant stenosis. Ophthalmic artery origins are within normal limits.  Right ICA terminus is mildly  dolichoectatic. Comparatively more dolichoectatic left ICA terminus.  MCA and ACA origins are within normal limits.  Anterior communicating artery is diminutive or absent.  Mildly  tortuous bilateral ACA branches, otherwise within normal limits. Bilateral MCA branches are within normal limits.  Large saccular left distal ICA aneurysm arises at the expected location of the left posterior communicating artery and projects posteriorly.  Left anterior choroidal artery appears within normal limits and arises just distal to the neck of the aneurysm (series 16109 image 82). The large aneurysm has a neck measuring 5 mm diameter.  The aneurysm encompasses 17 x 15 x 8 mm (AP by transverse by CC).   Review of the MIP images confirms the above findings.  IMPRESSION: 1.  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 Wide aneurysm neck, approximately 5 mm diameter. 2.  No other intracranial aneurysm identified.  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 3.  Normal left PCA P1 segment.  Diminutive left anterior choroidal artery whose origin is just beyond the neck of the aneurysm. 4.  No acute intracranial hemorrhage.  No other intracranial abnormality identified.  Study reviewed in person with Dr. Coletta Memos at the time dictation.   Original Report Authenticated By: Erskine Speed, M.D.    Dg Chest Port 1 View  01/23/2012  *RADIOLOGY REPORT*  Clinical Data: Status post right IJ central line place  PORTABLE CHEST - 1 VIEW  Comparison: Chest radiograph 01/27/2008  Findings: Patient is slightly rotated to the right.  Right IJ central venous catheter projects over the mid superior vena cava. Mild cardiomegaly is stable.  Emphysematous changes are present bilaterally.  Negative for pneumothorax or pleural effusion.  There is a curvilinear opacity along the left mediastinal margin at the level of the aortic arch, that was not present on prior chest radiograph of 2010.  Its etiology is uncertain.  IMPRESSION:  1.  Right IJ central venous catheter terminates in the mid SVC. Negative for pneumothorax. 2.  Curvilinear left superior  mediastinal contour abnormality of uncertain etiology, and new compared to chest radiograph of 2010. Suggest initial evaluation with a two-view chest radiograph to see if this apparent contour abnormality persists.   Original Report Authenticated By: Britta Mccreedy, M.D.     Assessment/Plan: She really looks quite good. She is awake and alert and conversant. Her third nerve palsy is mild and stable. Continue supportive care.   LOS: 2 days    [name] 01/25/2012, 7:23 AM

## 2012-01-25 NOTE — Progress Notes (Signed)
Physical Therapy Treatment Patient Details Name: [name] MRN: [id number] DOB: 1934-07-10 Today's Date: 01/25/2012 Time: 7829-5621 PT Time Calculation (min): 22 min  PT Assessment / Plan / Recommendation Comments on Treatment Session  Good job for first time ambulating since crani; Recommend using a RW at least initially for steadiness with amb secondary to losses of balance    Follow Up Recommendations  No PT follow up;Supervision for mobility/OOB     Does the patient have the potential to tolerate intense rehabilitation     Barriers to Discharge        Equipment Recommendations  Rolling walker with 5" wheels (initially; Will cont to assess if she needs one closer to dc)    Recommendations for Other Services    Frequency Min 3X/week   Plan Discharge plan remains appropriate    Precautions / Restrictions Precautions Precautions:  (Crani) Restrictions Weight Bearing Restrictions: No   Pertinent Vitals/Pain 4/10 Eye pain throughout session Double vision persisted, but did not worsen with activity  VSS post amb: HR 79 BP 93/76 (RN notified) O2 sats on RA 96%    Mobility  Bed Mobility Bed Mobility: Supine to Sit;Sitting - Scoot to Edge of Bed Supine to Sit: 6: Modified independent (Device/Increase time) Sitting - Scoot to Edge of Bed: 6: Modified independent (Device/Increase time) Transfers Transfers: Sit to Stand;Stand to Sit Sit to Stand: 4: Min guard;From bed;From toilet (with and without physical contact) Stand to Sit: 4: Min guard;To toilet;To chair/3-in-1;With upper extremity assist (with and without physical contact) Details for Transfer Assistance: Cues for safety, control, hand placement Ambulation/Gait Ambulation/Gait Assistance: 4: Min assist;3: Mod assist Ambulation Distance (Feet): 150 Feet Assistive device: 1 person hand held assist Ambulation/Gait Assistance Details: Requiring occasional mod assist secondary to 3-4 losses of balance related to  decr step width, decr single limb stance stability with occasional scissoring; Eye pain 4/10 and did not incr throughout amb Gait Pattern: Decreased stance time - right;Decreased stance time - left;Decreased stride length;Decreased step length - right;Decreased step length - left;Scissoring Gait velocity: slowed    Exercises     PT Diagnosis:    PT Problem List:   PT Treatment Interventions:     PT Goals Acute Rehab PT Goals Time For Goal Achievement: 01/31/12 Potential to Achieve Goals: Good Pt will go Sit to Stand: Independently PT Goal: Sit to Stand - Progress: Progressing toward goal Pt will go Stand to Sit: Independently PT Goal: Stand to Sit - Progress: Progressing toward goal Pt will Ambulate: >150 feet;with modified independence;with least restrictive assistive device PT Goal: Ambulate - Progress: Progressing toward goal  Visit Information  Last PT Received On: 01/25/12 Assistance Needed: +1    Subjective Data  Subjective: Agreeable to amb; Looking forward to getting home Patient Stated Goal: Home   Cognition  Overall Cognitive Status: Appears within functional limits for tasks assessed/performed Arousal/Alertness: Awake/alert Orientation Level: Appears intact for tasks assessed Behavior During Session: [facility] for tasks performed Cognition - Other Comments: some, though minimaly correction of room number reading, and discerning which was her room; Needed extra time    Balance     End of Session PT - End of Session Equipment Utilized During Treatment:  (1 person handheld assist) Activity Tolerance: Patient tolerated treatment well Patient left: in chair;with call bell/phone within reach (with breakfast setup for pt) Nurse Communication: Mobility status   GP     Van Clines [facility] [place], [place] [phone]  01/25/2012, 8:55 AM

## 2012-01-26 ENCOUNTER — Encounter (HOSPITAL_COMMUNITY): Payer: Self-pay | Admitting: Neurosurgery

## 2012-01-26 MED ORDER — PANTOPRAZOLE SODIUM 40 MG PO TBEC
40.0000 mg | DELAYED_RELEASE_TABLET | Freq: Every day | ORAL | Status: DC
  Administered 2012-01-26 – 2012-01-28 (×2): 40 mg via ORAL
  Filled 2012-01-26 (×2): qty 1

## 2012-01-26 NOTE — Progress Notes (Signed)
Physical Therapy Treatment Patient Details Name: [name] MRN: [id number] DOB: 24-Mar-1934 Today's Date: 01/26/2012 Time: 1478-2956 PT Time Calculation (min): 23 min  PT Assessment / Plan / Recommendation Comments on Treatment Session  Pt scored 17/24 on DGI scoring moderate fall risk with higher level function activities.  Pt continues to state double vision with left lateral area which affecting overall balance activities.  Therefore highly recommend OOPT for higher balance activites to improve independence at home and community.     Follow Up Recommendations  Supervision for mobility/OOB;Outpatient PT     Equipment Recommendations  Rolling walker with 5" wheels    Frequency Min 3X/week   Plan Frequency remains appropriate;Discharge plan needs to be updated    Precautions / Restrictions Precautions Precautions: Fall Restrictions Weight Bearing Restrictions: No   Pertinent Vitals/Pain No c/o pain    Mobility  Transfers Transfers: Sit to Stand;Stand to Sit Sit to Stand: 4: Min guard;From chair/3-in-1 Stand to Sit: 4: Min guard;To chair/3-in-1 Details for Transfer Assistance: Minguard for safety with cues for hand placement Ambulation/Gait Ambulation/Gait Assistance: 4: Min guard;4: Min assist Ambulation Distance (Feet): 200 Feet Assistive device: None Ambulation/Gait Assistance Details: Minguard with occasional min (A) with higher balance activities especially with head turns and overall turns. Gait Pattern: Step-through pattern;Decreased stride length;Shuffle Gait velocity: slowed Stairs: Yes Stairs Assistance: 4: Min assist Stairs Assistance Details (indicate cue type and reason): (A) to maintain balance with LOB with descending stairs. Stair Management Technique: Forwards;No rails Number of Stairs: 5  Wheelchair Mobility Wheelchair Mobility: No     PT Diagnosis:    PT Problem List:   PT Treatment Interventions:     PT Goals Acute Rehab PT Goals PT  Goal Formulation: With patient Time For Goal Achievement: 01/31/12 Potential to Achieve Goals: Good Pt will go Sit to Stand: Independently PT Goal: Sit to Stand - Progress: Progressing toward goal Pt will go Stand to Sit: Independently PT Goal: Stand to Sit - Progress: Progressing toward goal Pt will Transfer Bed to Chair/Chair to Bed: with modified independence PT Transfer Goal: Bed to Chair/Chair to Bed - Progress: Progressing toward goal Pt will Ambulate: >150 feet;with modified independence;with least restrictive assistive device PT Goal: Ambulate - Progress: Progressing toward goal Pt will Go Up / Down Stairs: 1-2 stairs;with min assist PT Goal: Up/Down Stairs - Progress: Met  Visit Information  Last PT Received On: 01/26/12 Assistance Needed: +1    Subjective Data  Subjective: "I'm suppose to go home." Patient Stated Goal: Home   Cognition  Overall Cognitive Status: Appears within functional limits for tasks assessed/performed Arousal/Alertness: Awake/alert Orientation Level: Appears intact for tasks assessed Behavior During Session: [facility] for tasks performed    Balance  Balance Balance Assessed: Yes Static Standing Balance Static Standing - Balance Support: No upper extremity supported Static Standing - Level of Assistance: 5: Stand by assistance Dynamic Gait Index Level Surface: Normal Change in Gait Speed: Mild Impairment Gait with Horizontal Head Turns: Mild Impairment Gait with Vertical Head Turns: Mild Impairment Gait and Pivot Turn: Mild Impairment Step Over Obstacle: Normal Step Around Obstacles: Mild Impairment Steps: Moderate Impairment Total Score: 17   End of Session PT - End of Session Equipment Utilized During Treatment: Gait belt Activity Tolerance: Patient tolerated treatment well Patient left: in chair;with call bell/phone within reach Nurse Communication: Mobility status   GP     Myron Lona 01/26/2012, 3:44 PM Jake Shark, PT  DPT [phone]

## 2012-01-26 NOTE — Progress Notes (Signed)
Utilization review completed.  P.J. Ileen Kahre,RN,BSN Case Manager [phone]  

## 2012-01-26 NOTE — Evaluation (Signed)
Occupational Therapy Evaluation Patient Details Name: [name] MRN: [id number] DOB: [birth year] Today's Date: 01/26/2012 Time: 7829-5621 OT Time Calculation (min): 21 min  OT Assessment / Plan / Recommendation Clinical Impression  78 yo female with 4-5 week history of diplopia, ptosis, and a feeling of pressure behind the left eye. MRI done this week shows what is a probable aneurysm in the left posterior communicating aneurysm territory. Pt s/p Lt Pterional Craniotomy for aneurysm clipping. Ot to follow and recommend outpatient OT.    OT Assessment  Patient needs continued OT Services    Follow Up Recommendations  Outpatient OT    Barriers to Discharge      Equipment Recommendations       Recommendations for Other Services    Frequency  Min 2X/week    Precautions / Restrictions Precautions Precautions: Fall Restrictions Weight Bearing Restrictions: No   Pertinent Vitals/Pain     ADL  Grooming: Wash/dry hands;Wash/dry face;Modified independent Where Assessed - Grooming: Unsupported standing Lower Body Dressing: Modified independent Where Assessed - Lower Body Dressing: Unsupported sitting Toilet Transfer: Min guard Toilet Transfer Method: Sit to Barista: Regular height toilet Tub/Shower Transfer: Minimal assistance Tub/Shower Transfer Method: Stand pivot Equipment Used: Gait belt Transfers/Ambulation Related to ADLs: Pt ambulating completing DGI with deficits noted in balance. pt with LOB x2 and bumping into wall on rt side x2. Pt requried assistance to correct LOB. Pt could benefit from outpatient services ADL Comments: Pt noted to have balance deficits and visual deficits. Pt unable to recall MD Cabbell name or operation. pt states "he saw it called doctor and they operated immediately for the thing behind my eye."     OT Diagnosis: Generalized weakness;Disturbance of vision  OT Problem List: Decreased safety awareness;Decreased  knowledge of precautions;Impaired balance (sitting and/or standing);Impaired vision/perception OT Treatment Interventions: Self-care/ADL training;DME and/or AE instruction;Therapeutic activities;Visual/perceptual remediation/compensation;Patient/family education;Balance training   OT Goals Acute Rehab OT Goals OT Goal Formulation: With patient Time For Goal Achievement: 02/02/12 Potential to Achieve Goals: Good ADL Goals Pt Will Perform Grooming: with modified independence;Standing at sink ADL Goal: Grooming - Progress: Goal set today Pt Will Transfer to Toilet: with modified independence;Regular height toilet ADL Goal: Toilet Transfer - Progress: Goal set today Miscellaneous OT Goals Miscellaneous OT Goal #1: Pt will complete Berg with score 50 to demonstrate decrease fall risk wtih ADLS OT Goal: Miscellaneous Goal #1 - Progress: Goal set today  Visit Information  Last OT Received On: 01/26/12 Assistance Needed: +1    Subjective Data  Subjective: "He is coming up with them later I will just wait"- declining oral care awaiting hubands arrival Patient Stated Goal: to go home tomorrow   Prior Functioning     Home Living Lives With: Spouse Available Help at Discharge: Family;Available 24 hours/day Type of Home: House Home Access: Stairs to enter [employer] of Steps: 1 Entrance Stairs-Rails: None Home Layout: One level Bathroom Shower/Tub: Tub/shower unit;Door [employer] Toilet: Standard Bathroom Accessibility: Yes How Accessible: Accessible via walker Home Adaptive Equipment: Walker - rolling Additional Comments: prism glasses with prism on left eye Prior Function Level of Independence: Independent Able to Take Stairs?: Yes Driving: No (stopped driving due to visual changes) Vocation: Retired Musician: No difficulties Dominant Hand: Right         Vision/Perception Vision - Assessment Vision Assessment: Vision tested Ocular Range of  Motion: Within Functional Limits Alignment/Gaze Preference: Within Defined Limits Tracking/Visual Pursuits: Able to track stimulus in all quads without difficulty Convergence: Impaired (  comment) Diplopia Assessment: Only with left gaze;Objects split on top of one another (pt with deficits only in Lt superior quad/ peripheral ) Additional Comments: Pt reports abnormal diplopia lt superior, and lateral left peripheral reports one on top of the other diplopia Pt with ptosis of Left eye and PTA diplopia    Cognition  Overall Cognitive Status: Appears within functional limits for tasks assessed/performed Arousal/Alertness: Awake/alert Orientation Level: Appears intact for tasks assessed Behavior During Session: [facility] for tasks performed    Extremity/Trunk Assessment Right Upper Extremity Assessment RUE ROM/Strength/Tone: Within functional levels RUE Sensation: WFL - Light Touch RUE Coordination: WFL - gross/fine motor Left Upper Extremity Assessment LUE ROM/Strength/Tone: Within functional levels LUE Sensation: WFL - Light Touch LUE Coordination: WFL - gross/fine motor Trunk Assessment Trunk Assessment: Normal     Mobility Bed Mobility Bed Mobility: Not assessed Transfers Sit to Stand: 4: Min guard;From chair/3-in-1 Stand to Sit: 4: Min guard;To chair/3-in-1 Details for Transfer Assistance: Minguard for safety with cues for hand placement     Shoulder Instructions     Exercise     Balance Balance Balance Assessed: Yes Static Standing Balance Static Standing - Balance Support: No upper extremity supported Static Standing - Level of Assistance: 5: Stand by assistance Dynamic Gait Index Level Surface: Normal Change in Gait Speed: Mild Impairment Gait with Horizontal Head Turns: Mild Impairment Gait with Vertical Head Turns: Mild Impairment Gait and Pivot Turn: Mild Impairment Step Over Obstacle: Normal Step Around Obstacles: Mild Impairment Steps: Moderate Impairment Total  Score: 17    End of Session OT - End of Session Activity Tolerance: Patient tolerated treatment well Patient left: in chair;with call bell/phone within reach Nurse Communication: Mobility status;Precautions  GO     Lucile Shutters 01/26/2012, 4:14 PM Pager: [phone]

## 2012-01-26 NOTE — Progress Notes (Signed)
Patient ID: [name], female   DOB: 05-03-1934, 78 y.o.   MRN: [id number] BP 114/58  Pulse 54  Temp 97.9 F (36.6 C) (Oral)  Resp 27  Ht 5\' 5"  (1.651 m)  Wt 49.2 kg (108 lb 7.5 oz)  BMI 18.05 kg/m2  SpO2 96% Alert and oriented x4, speech is clear and fluent Perrl, diplopia in right lateral, and midline gaze Ptosis is mildly improved 5/5 strength Wound is clean dry and without signs of infection Transfer to floor, discharge tomorrow

## 2012-01-26 NOTE — Clinical Documentation Improvement (Signed)
DOCUMENTATION CLARIFICATION QUERY  THIS DOCUMENT IS NOT A PERMANENT PART OF THE MEDICAL RECORD         01/26/12  Dear Dr. Franky Macho,  In an effort to better capture your patient's severity of illness, reflect appropriate length of stay and utilization of resources, a review of the patient medical record has revealed the following indicators.   Based on your clinical judgment, please clarify and document in a progress note and/or discharge summary the clinical condition associated with the following supporting information: In responding to this query please exercise your independent judgment.  The fact that a query is asked, does not imply that any particular answer is desired or expected.   Hello Dr. Franky Macho!  According to the documented Height and Weight in CHL/EPIC, the patients BMI is 18.05. If your clinical findings/judgment agrees with this,if possible could you please help clarify the suspected diagnosis in the progress note and discharge summary. THANK YOU!    [facility]ACT[facility]: A diagnosis of UNDERWEIGHT or MORBID OBESITY should have the BMI documented along with it.  Possible Clinical Conditions?  - Underweight  - Cachetic  - Other condition (please document in the progress notes and/or discharge summary)  - Cannot Clinically determine at this time   Supporting Information:  Estimated Body mass index is 18.05 kg/(m^2) as calculated from the following:   Height as of this encounter: 5\' 5" (1.651 m).   Weight as of this encounter: 108 lb 7.5 oz(49.2 kg).   Reviewed:  no additional documentation provided   Thank You,  Saul Fordyce  Clinical Documentation Specialist: [phone] Pager Office [phone]  [facility] [place]

## 2012-01-27 ENCOUNTER — Inpatient Hospital Stay (HOSPITAL_COMMUNITY): Payer: MEDICARE

## 2012-01-27 DIAGNOSIS — Z01818 Encounter for other preprocedural examination: Secondary | ICD-10-CM | POA: Diagnosis not present

## 2012-01-27 DIAGNOSIS — I671 Cerebral aneurysm, nonruptured: Secondary | ICD-10-CM | POA: Diagnosis not present

## 2012-01-27 DIAGNOSIS — I6200 Nontraumatic subdural hemorrhage, unspecified: Secondary | ICD-10-CM | POA: Diagnosis not present

## 2012-01-27 LAB — TYPE AND SCREEN
ABO/RH(D): B NEG
Antibody Screen: NEGATIVE
Unit division: 0
Unit division: 0

## 2012-01-27 MED ORDER — PROCHLORPERAZINE 25 MG RE SUPP
25.0000 mg | Freq: Two times a day (BID) | RECTAL | Status: DC | PRN
  Administered 2012-01-28: 25 mg via RECTAL
  Filled 2012-01-27 (×2): qty 1

## 2012-01-27 MED ORDER — LEVETIRACETAM 500 MG PO TABS
500.0000 mg | ORAL_TABLET | Freq: Two times a day (BID) | ORAL | Status: DC
  Administered 2012-01-27 – 2012-01-28 (×2): 500 mg via ORAL
  Filled 2012-01-27 (×3): qty 1

## 2012-01-27 NOTE — Progress Notes (Signed)
Agree with OT/PT Cancellation note.  [place], [place] DPT [phone]

## 2012-01-27 NOTE — Progress Notes (Signed)
OT / PTCancellation Note  Patient Details Name: DEMA TIMMONS MRN: [id number] DOB: 06-29-1934   Cancelled Treatment:    Reason Eval/Treat Not Completed: Medical issues which prohibited therapy (nauseated and headache) Ot / Pt to reattempt treatment at a later time.   Harrel Car[place] Pager: [phone]  01/27/2012, 9:53 AM

## 2012-01-27 NOTE — Progress Notes (Signed)
Patient ID: [name], female   DOB: 05/06/1934, 78 y.o.   MRN: [id number] BP 139/64  Pulse 71  Temp 98.4 F (36.9 C) (Oral)  Resp 20  Ht 5\' 5"  (1.651 m)  Wt 49.2 kg (108 lb 7.5 oz)  BMI 18.05 kg/m2  SpO2 98% Alert and oriented x4 Speech is clear and fluent Perrl, symmetric facies and function Wound is clean dry and without signs of infection Nauseated today. Head CT looks very good.  Possible dc tomorrow

## 2012-01-27 NOTE — Progress Notes (Signed)
Pt vomited this morning and was given 4mg  Zofran.  She also stated headache pain 4/10.  1 tab vicodin given.  Dr. Franky Macho notified.  He wanted discharge orders to home discontinued and said he would see her later today.

## 2012-01-28 MED ORDER — LEVETIRACETAM 500 MG PO TABS: 500.0000 mg | ORAL_TABLET | Freq: Two times a day (BID) | ORAL | Status: DC

## 2012-01-28 MED ORDER — ACETAMINOPHEN-CODEINE #3 300-30 MG PO TABS: 1.0000 | ORAL_TABLET | Freq: Four times a day (QID) | ORAL | Status: DC | PRN

## 2012-01-28 NOTE — Progress Notes (Signed)
Dr. Franky Macho spoke with the patient and family about discharge plans. Reviewed discharge instructions with the patient and family; the patient had no questions. D/C'd the patients IV. Instructed the patient to call with any further questions.

## 2012-01-28 NOTE — Discharge Instructions (Signed)
Craniotomy °Care After °Please read the instructions outlined below and refer to this sheet in the next few weeks. These discharge instructions provide you with general information on caring for yourself after you leave the hospital. Your surgeon may also give you specific instructions. While your treatment has been planned according to the most current medical practices available, unavoidable complications occasionally occur. If you have any problems or questions after discharge, please call your surgeon. °Although there are many types of brain surgery, recovery following craniotomy (surgical opening of the skull) is much the same for each. However, recovery depends on many factors. These include the type and severity of brain injury and the type of surgery. It also depends on any nervous system function problems (neurological deficits) before surgery. If the craniotomy was done for cancer, chemotherapy and radiation could follow. You could be in the hospital from 5 days to a couple weeks. This depends on the type of surgery, findings, and whether there are complications. °HOME CARE INSTRUCTIONS  °· It is not unusual to hear a clicking noise after a craniotomy, the plates and screws used to attach the bone flap can sometimes cause this. It is a normal occurrence if this does happen °· Do not drive for 10 days after the operation °· Your scalp may feel spongy for a while, because of fluid under it. This will gradually get better. Occasionally, the surgeon will not replace the bone that was removed to access the brain. If there is a bony defect, the surgeon will ask you to wear a helmet for protection. This is a discussion you should have with your surgeon prior to leaving the hospital (discharge). °· Numbness may persist in some areas of your scalp. °· Take all medications as directed. Sometimes steroids to control swelling are prescribed. Anticonvulsants to prevent seizures may also be given. Do not use alcohol,  other drugs, or medications unless your surgeon says it is OK. °· Keep the wound dry and clean. The wound may be washed gently with soap and water. Then, you may gently blot or dab it dry, without rubbing. Do not take baths, use swimming pools or hot tubs for 10 days, or as instructed by your caregiver. It is best to wait to see you surgeon at your first postoperative visit, and to get directions at that time. °· Only take over-the-counter or prescription medicines for pain, discomfort, or fever as directed by your caregiver. °· You may continue your normal diet, as directed. °· Walking is OK for exercise. Wait at least 3 months before you return to mild, non-contact sports or as your surgeon suggests. Contact sports should be avoided for at least 1 year, unless your surgeon says it is OK. °· If you are prescribed steroids, take them exactly as prescribed. If you start having a decrease in nervous system functions (neurological deficits) and headaches as the dose of steroids is reduced, tell your surgeon right away. °· When the anticonvulsant prescription is finished you no longer need to take it. °SEEK IMMEDIATE MEDICAL CARE IF:  °· You develop nausea, vomiting, severe headaches, confusion, or you have a seizure. °· You develop chest pain, a stiff neck, or difficulty breathing. °· There is redness, swelling, or increasing pain in the wound or pin insertion sites. °· You have an increase in swelling or bruising around the eyes. °· There is drainage or pus coming from the wound. °· You have an oral temperature above 102° F (38.9° C), not controlled by medicine. °·   You notice a foul smell coming from the wound or dressing. °· The wound breaks open (edges not staying together) after the stitches have been removed. °· You develop dizziness or fainting while standing. °· You develop a rash. °· You develop any reaction or side effects to the medications given. °Document Released: 03/25/2005 Document Revised: 03/17/2011  Document Reviewed: 01/01/2009 °Exit[employer]® Patient Information ©2013 [employer]. ° °

## 2012-01-28 NOTE — Progress Notes (Signed)
Physical Therapy Treatment Patient Details Name: [name] MRN: [id number] DOB: 14-Feb-1934 Today's Date: 01/28/2012 Time: 4098-1191 PT Time Calculation (min): 16 min  PT Assessment / Plan / Recommendation Comments on Treatment Session  Pt continues to c/o nausea.  Used RW this session to improve overall mobilty and independence.  Pt educated on scanning visual field especially left side to prevent running objects.    Follow Up Recommendations  Supervision for mobility/OOB;Outpatient PT     Equipment Recommendations  Rolling walker with 5" wheels    Frequency Min 3X/week   Plan Frequency remains appropriate;Discharge plan needs to be updated    Precautions / Restrictions Precautions Precautions: Fall Restrictions Weight Bearing Restrictions: No   Pertinent Vitals/Pain C/o not feeling well but does not rate any pain.     Mobility  Bed Mobility Bed Mobility: Supine to Sit;Sitting - Scoot to Edge of Bed Supine to Sit: 6: Modified independent (Device/Increase time) Sitting - Scoot to Edge of Bed: 6: Modified independent (Device/Increase time) Transfers Transfers: Sit to Stand;Stand to Sit Sit to Stand: 5: Supervision;From bed Stand to Sit: 5: Supervision;To chair/3-in-1 Details for Transfer Assistance: v/c to keep RW with for safety.  Max cues for forward gaze. Ambulation/Gait Ambulation/Gait Assistance: 4: Min guard Ambulation Distance (Feet): 175 Feet Assistive device: Rolling walker Ambulation/Gait Assistance Details: Minguard for safety  Gait Pattern: Step-through pattern;Decreased stride length;Shuffle Gait velocity: slowed Stairs: No Wheelchair Mobility Wheelchair Mobility: No    Exercises     PT Diagnosis:    PT Problem List:   PT Treatment Interventions:     PT Goals Acute Rehab PT Goals PT Goal Formulation: With patient Time For Goal Achievement: 01/31/12 Potential to Achieve Goals: Good Pt will go Sit to Stand: Independently PT Goal: Sit to  Stand - Progress: Progressing toward goal Pt will go Stand to Sit: Independently PT Goal: Stand to Sit - Progress: Progressing toward goal Pt will Transfer Bed to Chair/Chair to Bed: with modified independence PT Transfer Goal: Bed to Chair/Chair to Bed - Progress: Progressing toward goal Pt will Ambulate: >150 feet;with modified independence;with least restrictive assistive device PT Goal: Ambulate - Progress: Progressing toward goal  Visit Information  Last PT Received On: 01/28/12 Assistance Needed: +1    Subjective Data  Subjective: "I'm suppose to go home."   Cognition  Overall Cognitive Status: Appears within functional limits for tasks assessed/performed Arousal/Alertness: Awake/alert Orientation Level: Appears intact for tasks assessed Behavior During Session: [facility] for tasks performed    Balance  Balance Balance Assessed: Yes Static Standing Balance Static Standing - Balance Support: No upper extremity supported Static Standing - Level of Assistance: 5: Stand by assistance  End of Session PT - End of Session Equipment Utilized During Treatment: Gait belt Activity Tolerance: Patient tolerated treatment well Patient left: in chair;with call bell/phone within reach Nurse Communication: Mobility status   GP     [name] 01/28/2012, 10:40 AM Jake Shark, PT DPT [phone]

## 2012-01-28 NOTE — Discharge Summary (Signed)
Physician Discharge Summary  Patient ID: Kathleen MAIONE MRN: [id number] DOB/AGE: 04/15/1934 78 y.o.  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 Post op she did very well. A post operative CT showed pneumocephalus, and a small amount of blood. No shift was present. She had some nausea postop which subsided at discharge. Her wound is clean, dry, and without signs of infection at discharge.Neurologically she is normal at discharge.   Consults: None  Significant Diagnostic Studies: none  Treatments: surgery: As Above  Discharge Exam: Blood pressure 132/69, pulse 71, temperature 97.9 F (36.6 C), temperature source Oral, resp. rate 23, height 5\' 5"  (1.651 m), weight 49.2 kg (108 lb 7.5 oz), SpO2 98.00%. General appearance: alert, cooperative, appears stated age and no distress Neurologic: Mental status: Alert, oriented, thought content appropriate Cranial nerves:  I: smell Not tested  II: visual acuity  OS: normal    OD: normal  II: visual fields Full to confrontation  II: pupils Equal, round, reactive, right lateral gaze diplopia  III,VII: ptosis Left ptosis  III,IV,VI: extraocular muscles  Full ROM  V: mastication Normal  V: facial light touch sensation  Normal  V,VII: corneal reflex  Present  VII: facial muscle function - upper  Normal  VII: facial muscle function - lower Normal  VIII: hearing Not tested  IX: soft palate elevation  Normal  IX,X: gag reflex Present  XI: trapezius strength  5/5  XI: sternocleidomastoid strength 5/5  XI: neck flexion strength  5/5  XII: tongue strength  Normal   Motor:  grossly normal Coordination: normal  Disposition: 01-Home or Self Care     Medication List     As of 01/28/2012 10:36 AM    TAKE these medications         acetaminophen-codeine 300-30 MG per tablet   Commonly known as: TYLENOL #3   Take 1 tablet by mouth every 6 (six) hours as needed for pain.      cholecalciferol 1000 UNITS tablet   Commonly known as: VITAMIN D   Take 2,000 Units by mouth daily.      CO Q-10 PO   Take 1 capsule by mouth at bedtime. 200 mg (over the counter)      ESTER C PO   Take 1 tablet by mouth every morning.      ibuprofen 200 MG tablet   Commonly known as: ADVIL,MOTRIN   Take 200 mg by mouth every 6 (six) hours as needed. Pain      levETIRAcetam 500 MG tablet   Commonly known as: KEPPRA   Take 1 tablet (500 mg total) by mouth every 12 (twelve) hours.      multivitamin with minerals Tabs   Take 1 tablet by mouth daily.      simvastatin 40 MG tablet   Commonly known as: ZOCOR   Take 40 mg by mouth at bedtime.           Follow-up Information    Follow up with Pahoua Schreiner L, MD. Call in 1 week. (for staple removal)    Contact information:   [address],  [address] [place] [address] [phone]          Signed: Neftaly Inzunza L 01/28/2012, 10:36 AM

## 2012-01-28 NOTE — Progress Notes (Signed)
Occupational Therapy Treatment Patient Details Name: [name] MRN: [id number] DOB: [birth year] Today's Date: 01/28/2012 Time: 1308-6578 OT Time Calculation (min): 16 min  OT Assessment / Plan / Recommendation Comments on Treatment Session Pt progressing this session with OT. Pt with nausea at 3AM but currently able to participate. Pt reports not feeling well today.    Follow Up Recommendations  Outpatient OT    Barriers to Discharge       Equipment Recommendations       Recommendations for Other Services    Frequency Min 2X/week   Plan Discharge plan remains appropriate    Precautions / Restrictions Precautions Precautions: Fall Restrictions Weight Bearing Restrictions: No   Pertinent Vitals/Pain Reports not feeling well      ADL  Grooming: Teeth care;Modified independent Where Assessed - Grooming: Unsupported standing Toilet Transfer: Radiographer, therapeutic Method: Sit to Barista: Regular height toilet Toileting - Clothing Manipulation and Hygiene: Supervision/safety Where Assessed - Toileting Clothing Manipulation and Hygiene: Sit to stand from 3-in-1 or toilet Equipment Used: Gait belt Transfers/Ambulation Related to ADLs: Pt ambulating this session with RW. Pt with decr gait length and speed. Pt encouraged to incr step length. Pt states "i dont feel like it"- Pt demonstrates fall risk but incr safety with RW ADL Comments: Pt completed bed mobility, toilet transfer and sink level grooming. pt progressing well. Pt demonstrates mild balance deficits. Pt safer with RW. PT wiill have assist of husband.    OT Diagnosis:    OT Problem List:   OT Treatment Interventions:     OT Goals Acute Rehab OT Goals OT Goal Formulation: With patient Time For Goal Achievement: 02/02/12 Potential to Achieve Goals: Good ADL Goals Pt Will Perform Grooming: with modified independence;Standing at sink ADL Goal: Grooming - Progress:  Progressing toward goals Pt Will Transfer to Toilet: with modified independence;Regular height toilet ADL Goal: Toilet Transfer - Progress: Progressing toward goals Miscellaneous OT Goals Miscellaneous OT Goal #1: Pt will complete Berg with score 50 to demonstrate decrease fall risk wtih ADLS  Visit Information  Last OT Received On: 01/28/12 Assistance Needed: +1    Subjective Data      Prior Functioning       Cognition  Overall Cognitive Status: Appears within functional limits for tasks assessed/performed Arousal/Alertness: Awake/alert Orientation Level: Appears intact for tasks assessed Behavior During Session: [facility] for tasks performed    Mobility  Shoulder Instructions Bed Mobility Bed Mobility: Supine to Sit;Sitting - Scoot to Edge of Bed Supine to Sit: 6: Modified independent (Device/Increase time) Sitting - Scoot to Edge of Bed: 6: Modified independent (Device/Increase time) Transfers Sit to Stand: 5: Supervision;From bed Stand to Sit: 5: Supervision;To chair/3-in-1 Details for Transfer Assistance: v/c to keep RW with for safety       Exercises      Balance     End of Session OT - End of Session Equipment Utilized During Treatment: Gait belt Activity Tolerance: Patient tolerated treatment well Patient left: in chair;with call bell/phone within reach Nurse Communication: Mobility status;Precautions  GO     Lucile Shutters 01/28/2012, 10:23 AM Pager: [phone]

## 2012-04-28 DIAGNOSIS — IMO0002 Reserved for concepts with insufficient information to code with codable children: Secondary | ICD-10-CM | POA: Diagnosis not present

## 2012-06-14 DIAGNOSIS — IMO0002 Reserved for concepts with insufficient information to code with codable children: Secondary | ICD-10-CM | POA: Diagnosis not present

## 2012-07-28 DIAGNOSIS — E785 Hyperlipidemia, unspecified: Secondary | ICD-10-CM | POA: Diagnosis not present

## 2012-07-28 DIAGNOSIS — Z79899 Other long term (current) drug therapy: Secondary | ICD-10-CM | POA: Diagnosis not present

## 2012-08-05 DIAGNOSIS — Z1212 Encounter for screening for malignant neoplasm of rectum: Secondary | ICD-10-CM | POA: Diagnosis not present

## 2012-08-05 DIAGNOSIS — I729 Aneurysm of unspecified site: Secondary | ICD-10-CM | POA: Diagnosis not present

## 2012-08-05 DIAGNOSIS — E785 Hyperlipidemia, unspecified: Secondary | ICD-10-CM | POA: Diagnosis not present

## 2012-08-05 DIAGNOSIS — I4949 Other premature depolarization: Secondary | ICD-10-CM | POA: Diagnosis not present

## 2012-10-07 DIAGNOSIS — Z23 Encounter for immunization: Secondary | ICD-10-CM | POA: Diagnosis not present

## 2013-01-09 IMAGING — CR DG WRIST COMPLETE 3+V*L*
4 series · 4 of 4 positions shown · non-contrast
Comparison: None.

CLINICAL DATA: Traumatic injury and pain

LEFT WRIST - COMPLETE 3+ VIEW

[view not recorded (1 of 4)]
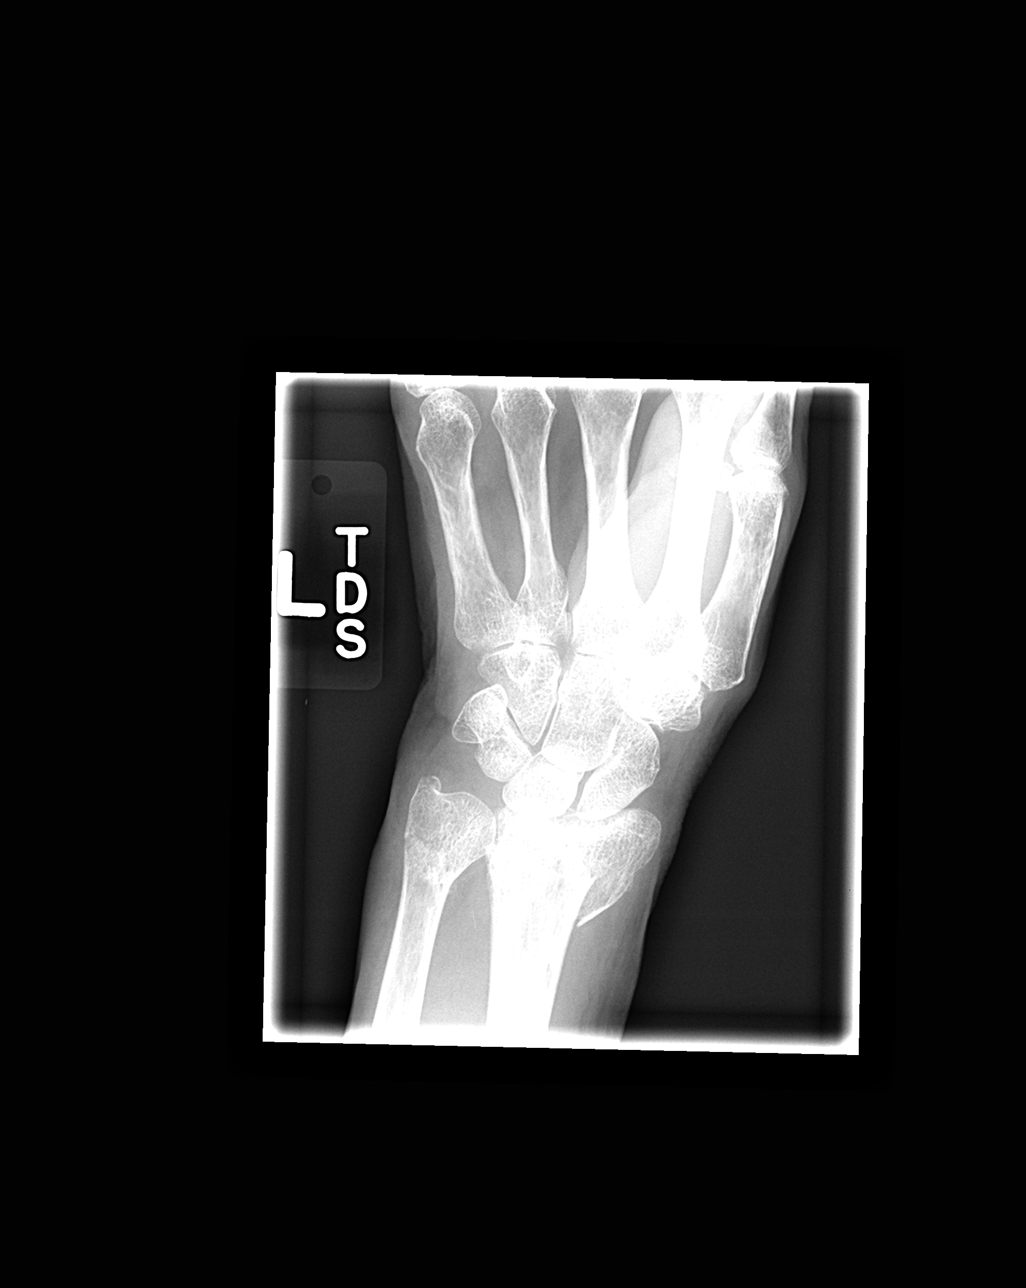

[view not recorded (2 of 4)]
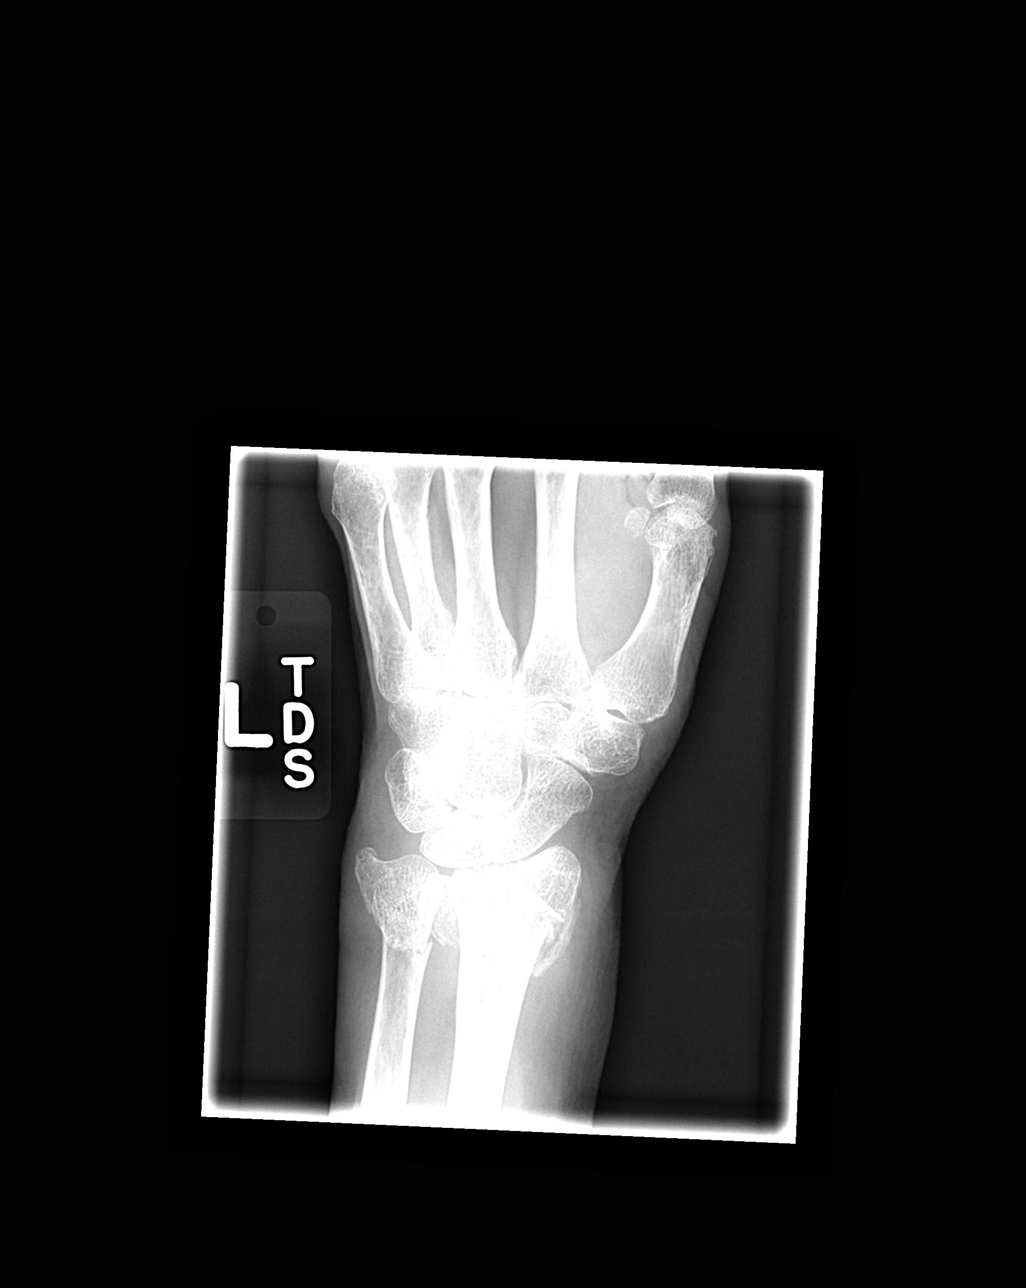

[view not recorded (3 of 4)]
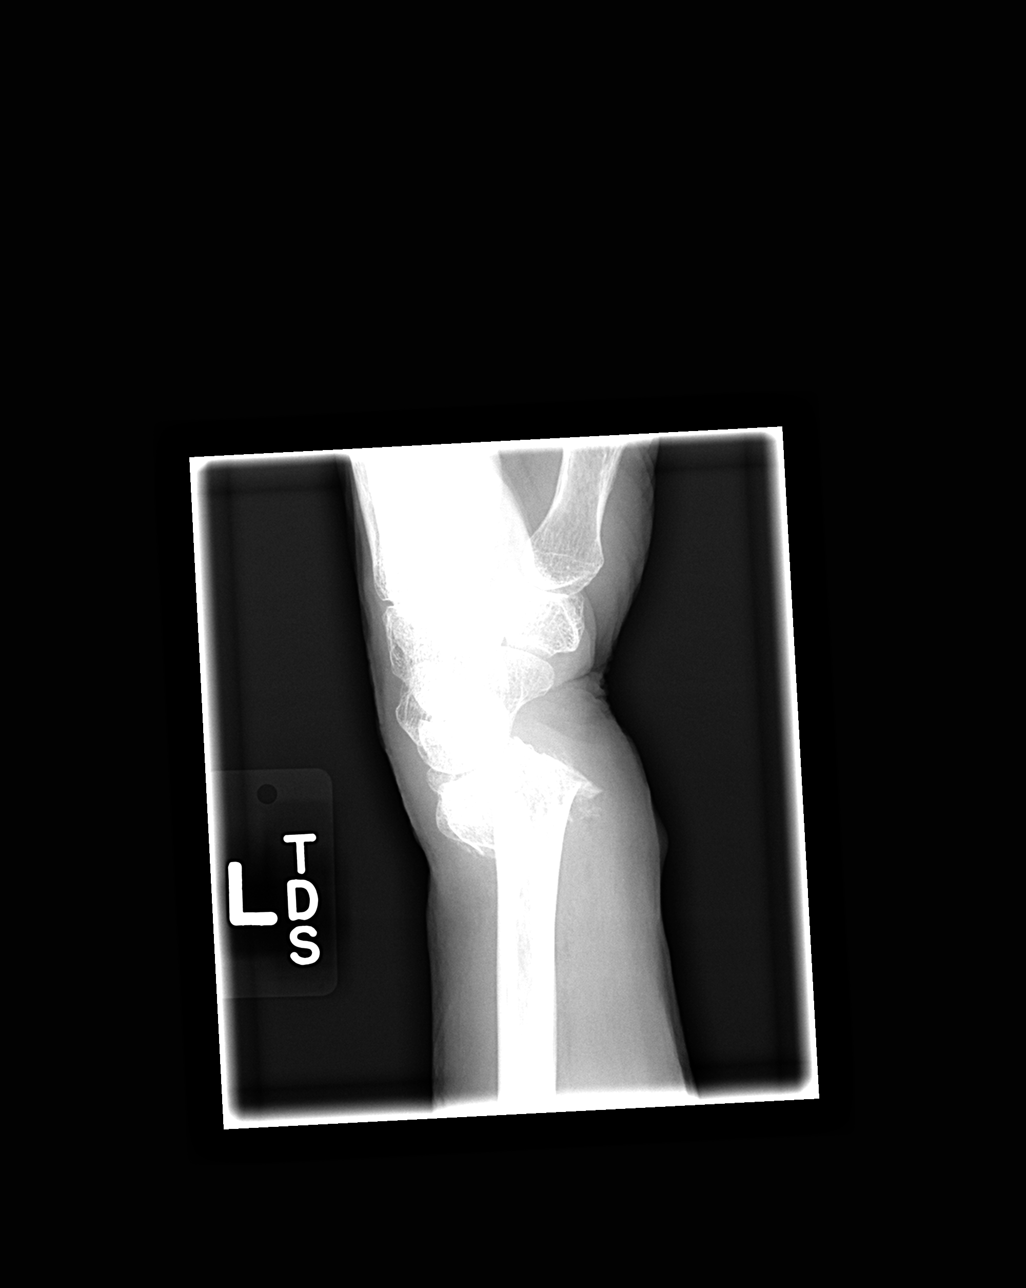

[view not recorded (4 of 4)]
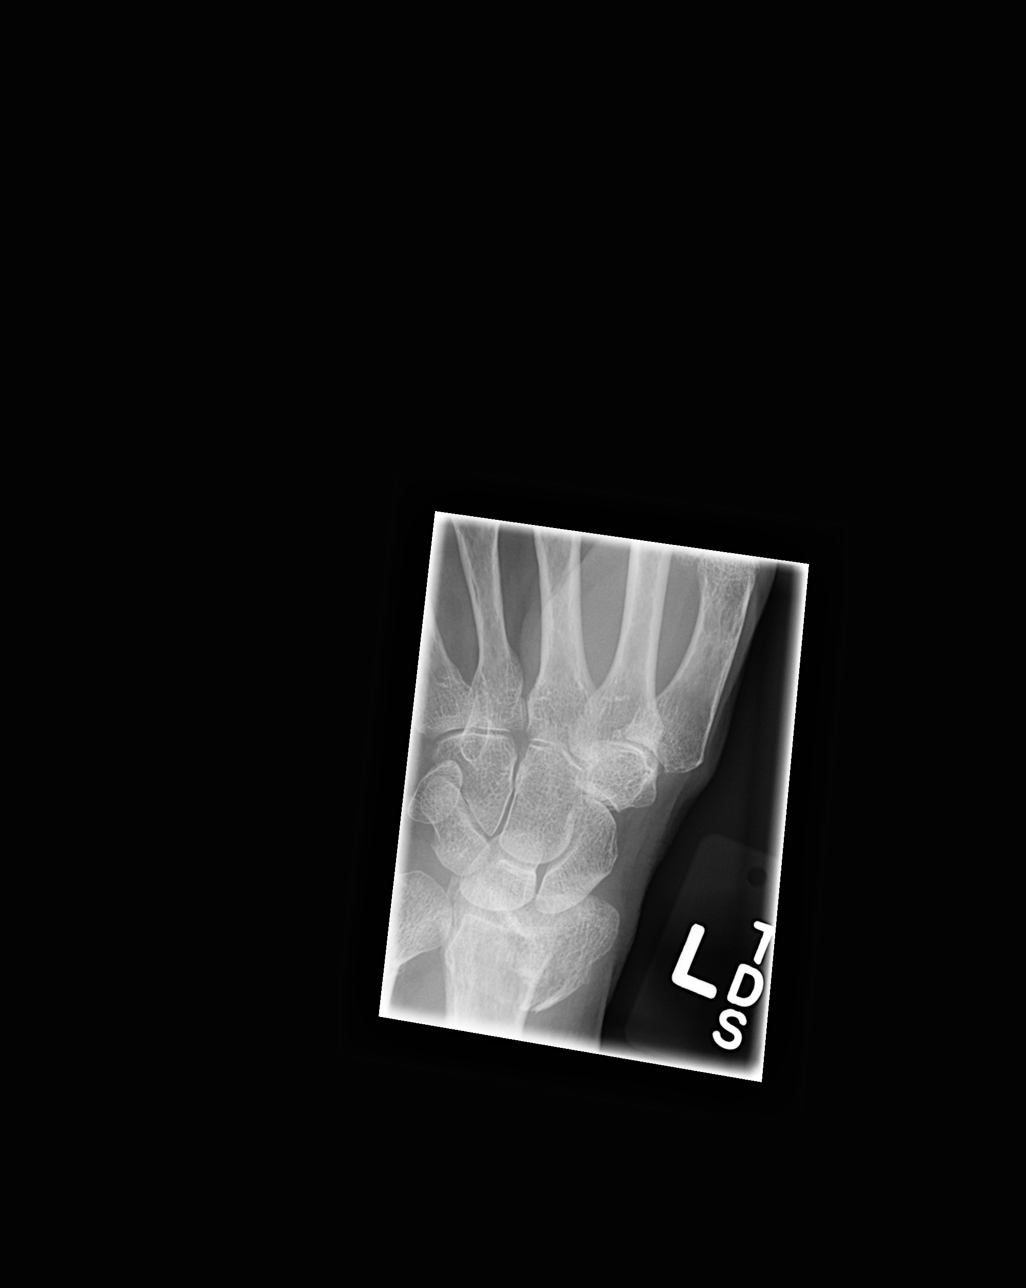

[4 of 4 positions shown; findings below may reference images not displayed]

FINDINGS: There is a comminuted fracture of the distal left radius
of the impaction there is a mildly displaced distal ulnar fracture
as well.  and posterior angulation at the fracture site.
IMPRESSION: Distal left radial and ulnar fractures as described.

## 2013-06-05 IMAGING — CT CT HEAD W/O CM
1 series · 15 of 30 positions shown, 19 images · non-contrast
Comparison: CTA head 01/23/2012.

CLINICAL DATA: 77-year-old female status post craniotomy/aneurysm
clipping.

CT HEAD WITHOUT CONTRAST
TECHNIQUE: Contiguous axial images were obtained from the base of
the skull through the vertex without contrast.

[Series 2: head routine 4.8 h37s · axial · 0.50mm/px · z∈[-122,+30]mm · 15 of 36 slices shown, 19 images]
[im 2/36  brain]
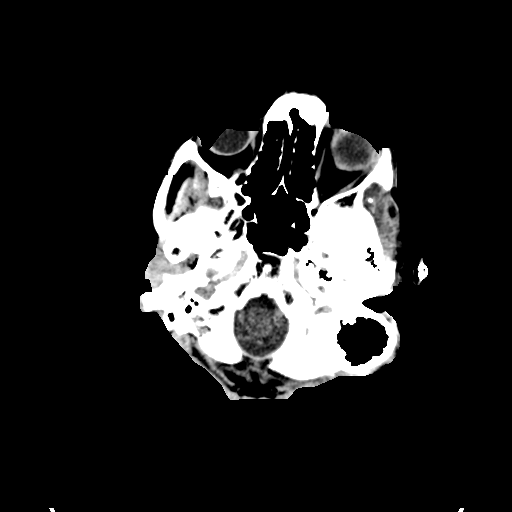
[im 2/36  bone]
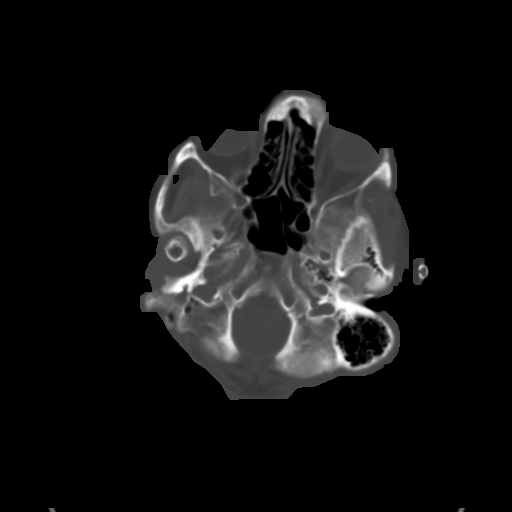
[im 4/36  brain]
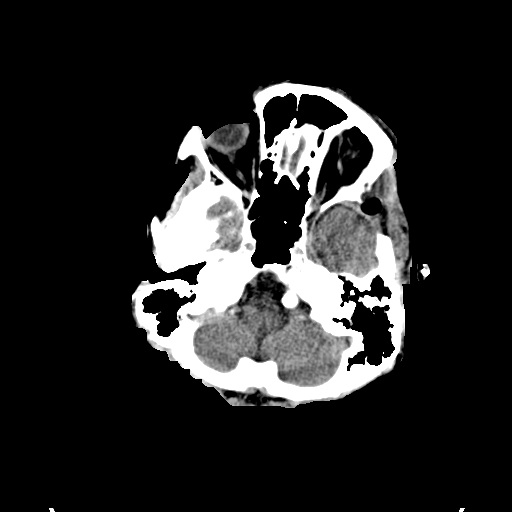
[im 7/36  brain]
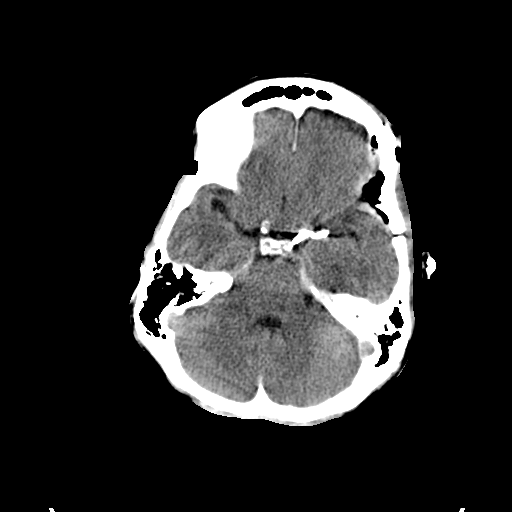
[im 9/36  brain]
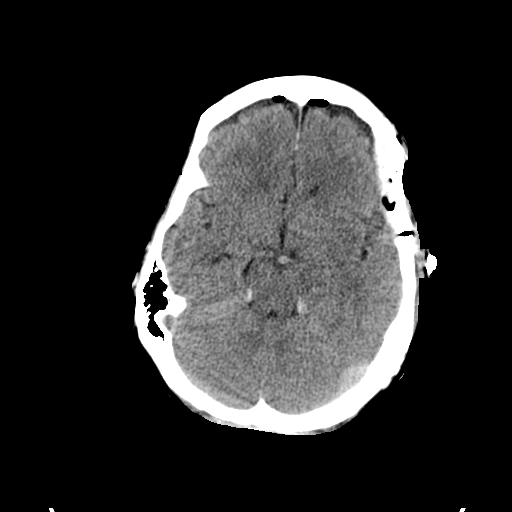
[im 11/36  brain]
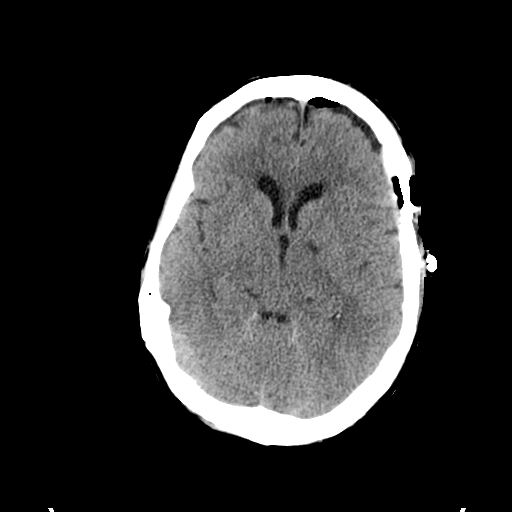
[im 11/36  bone]
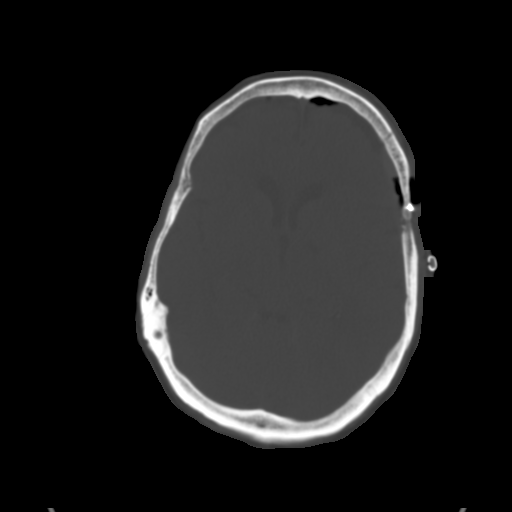
[im 14/36  brain]
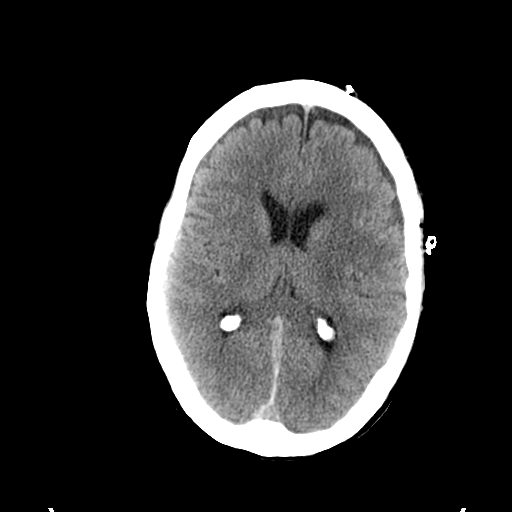
[im 16/36  brain]
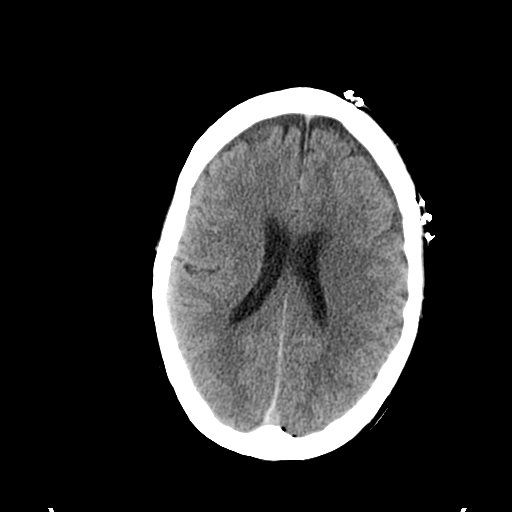
[im 19/36  brain]
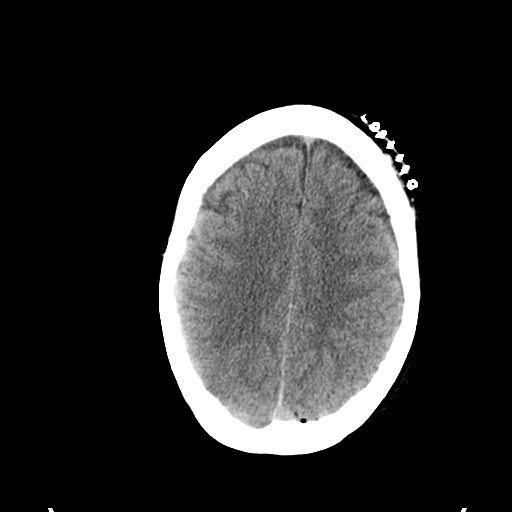
[im 20/36  brain]
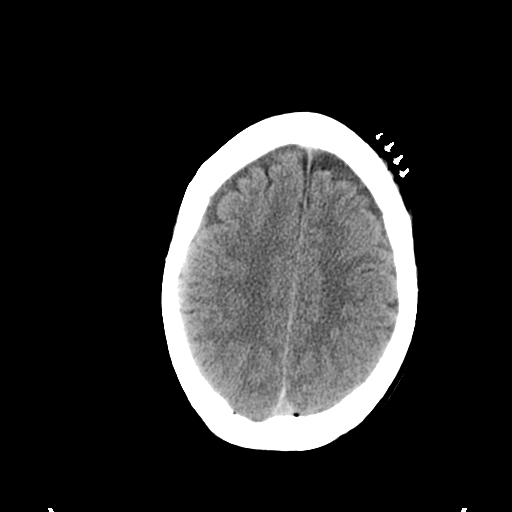
[im 20/36  bone]
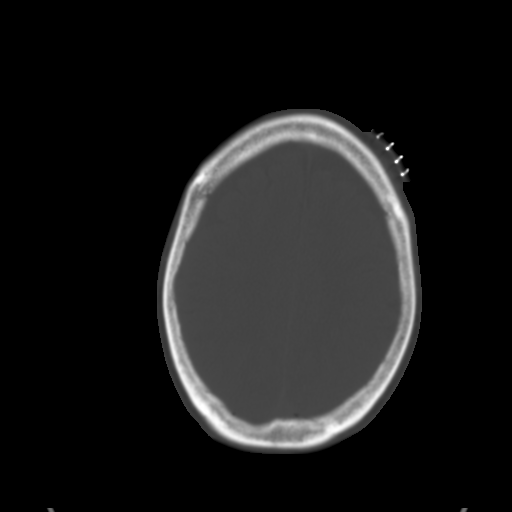
[im 22/36  brain]
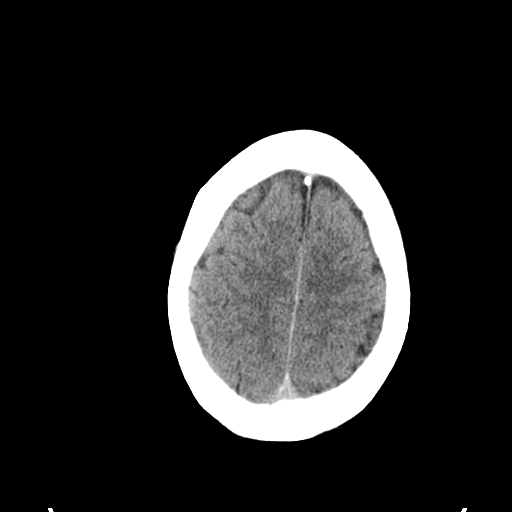
[im 25/36  brain]
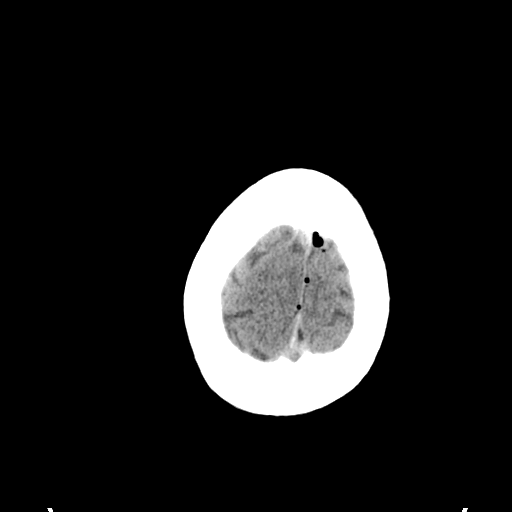
[im 27/36  brain]
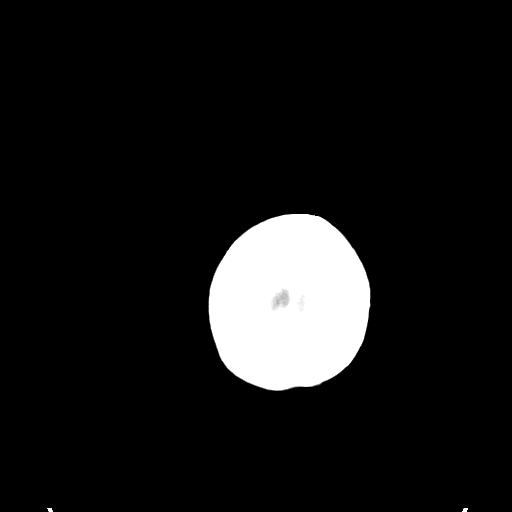
[im 29/36  brain]
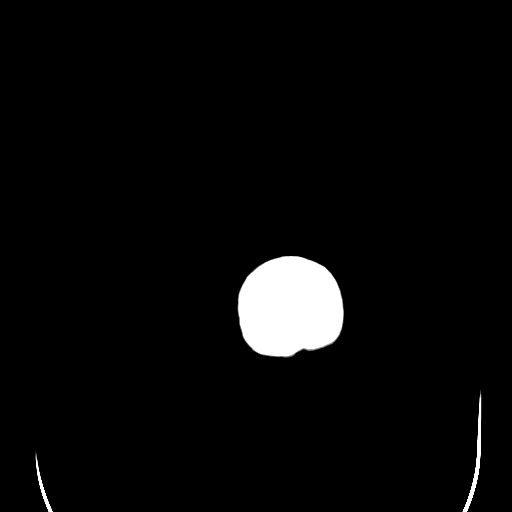
[im 29/36  bone]
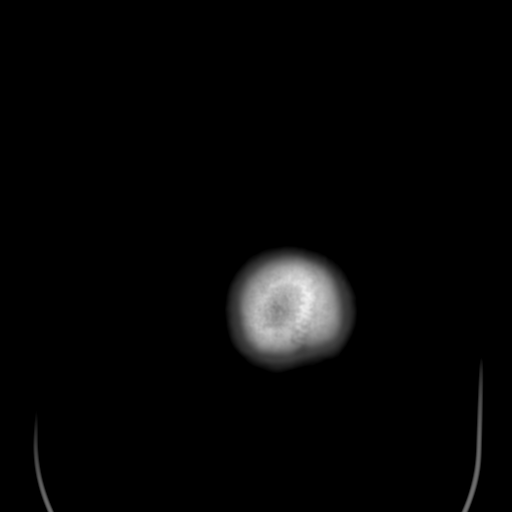
[im 32/36  brain]
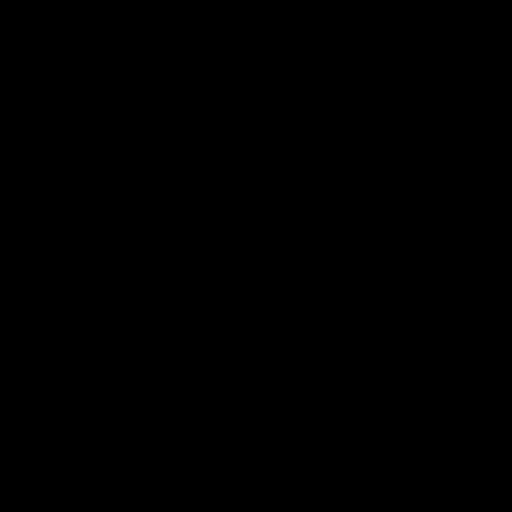
[im 34/36  brain]
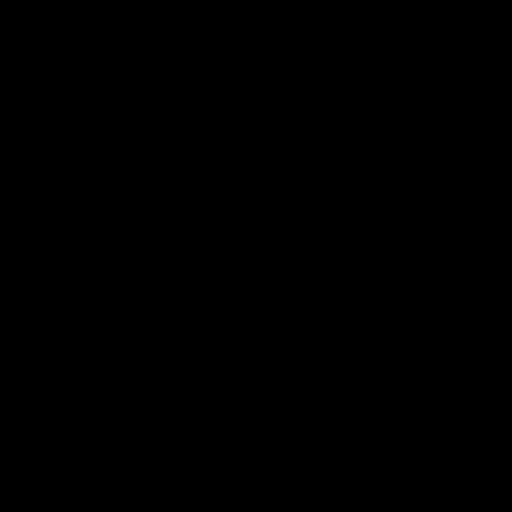

[15 of 30 positions shown; findings below may reference images not displayed]

FINDINGS: Sequelae of left frontotemporal craniotomy.  Overlying
postoperative changes to the scalp soft tissues including a small
volume of subcutaneous gas. Visualized paranasal sinuses and
mastoids are clear.  Orbit soft tissues are stable except for some
gas in the left orbit.  No unexpected osseous changes.

Distal left ICA region aneurysm clip now in place. The previously
hyperdense aneurysm is no longer evident by noncontrast CT.  Small
volume pneumocephalus.  Mixed density left subdural hematoma along
the anterior middle cranial fossa and left convexity measures up to
7 mm in thickness.  There is also a new similar size, small,
predominately low density subdural collection along the right
anterior convexity.

No midline shift.  No ventriculomegaly.  No intraventricular
hemorrhage or residual subarachnoid hemorrhage is identified.  No
evidence of cortically based acute infarction identified.  Stable
gray-white matter differentiation throughout the brain.
IMPRESSION: 1. Sequelae of left frontotemporal craniotomy and left Pcomm region
aneurysm clipping.
2.  Small mixed density left subdural hematoma up to 7 mm in
thickness.  Slightly smaller low density right anterior subdural.
No midline shift.
3.  No residual subarachnoid hemorrhage.  No ventriculomegaly.

## 2013-08-08 DIAGNOSIS — Z79899 Other long term (current) drug therapy: Secondary | ICD-10-CM | POA: Diagnosis not present

## 2013-08-08 DIAGNOSIS — E785 Hyperlipidemia, unspecified: Secondary | ICD-10-CM | POA: Diagnosis not present

## 2013-08-16 DIAGNOSIS — N200 Calculus of kidney: Secondary | ICD-10-CM | POA: Diagnosis not present

## 2013-08-16 DIAGNOSIS — Z0000 Encounter for general adult medical examination without abnormal findings: Secondary | ICD-10-CM | POA: Diagnosis not present

## 2013-08-16 DIAGNOSIS — Z23 Encounter for immunization: Secondary | ICD-10-CM | POA: Diagnosis not present

## 2013-08-16 DIAGNOSIS — E785 Hyperlipidemia, unspecified: Secondary | ICD-10-CM | POA: Diagnosis not present

## 2013-08-16 DIAGNOSIS — I281 Aneurysm of pulmonary artery: Secondary | ICD-10-CM | POA: Diagnosis not present

## 2013-10-20 DIAGNOSIS — Z23 Encounter for immunization: Secondary | ICD-10-CM | POA: Diagnosis not present

## 2014-08-11 DIAGNOSIS — I671 Cerebral aneurysm, nonruptured: Secondary | ICD-10-CM | POA: Diagnosis not present

## 2014-08-11 DIAGNOSIS — E785 Hyperlipidemia, unspecified: Secondary | ICD-10-CM | POA: Diagnosis not present

## 2014-08-11 DIAGNOSIS — Z79899 Other long term (current) drug therapy: Secondary | ICD-10-CM | POA: Diagnosis not present

## 2014-09-08 DIAGNOSIS — I671 Cerebral aneurysm, nonruptured: Secondary | ICD-10-CM | POA: Diagnosis not present

## 2014-09-08 DIAGNOSIS — E785 Hyperlipidemia, unspecified: Secondary | ICD-10-CM | POA: Diagnosis not present

## 2014-09-08 DIAGNOSIS — Z681 Body mass index (BMI) 19 or less, adult: Secondary | ICD-10-CM | POA: Diagnosis not present

## 2014-09-08 DIAGNOSIS — R945 Abnormal results of liver function studies: Secondary | ICD-10-CM | POA: Diagnosis not present

## 2014-10-24 DIAGNOSIS — Z23 Encounter for immunization: Secondary | ICD-10-CM | POA: Diagnosis not present

## 2015-09-13 DIAGNOSIS — Z23 Encounter for immunization: Secondary | ICD-10-CM | POA: Diagnosis not present

## 2015-09-19 DIAGNOSIS — E785 Hyperlipidemia, unspecified: Secondary | ICD-10-CM | POA: Diagnosis not present

## 2015-09-19 DIAGNOSIS — Z79899 Other long term (current) drug therapy: Secondary | ICD-10-CM | POA: Diagnosis not present

## 2015-09-21 DIAGNOSIS — E785 Hyperlipidemia, unspecified: Secondary | ICD-10-CM | POA: Diagnosis not present

## 2015-09-21 DIAGNOSIS — Z681 Body mass index (BMI) 19 or less, adult: Secondary | ICD-10-CM | POA: Diagnosis not present

## 2015-09-21 DIAGNOSIS — M5136 Other intervertebral disc degeneration, lumbar region: Secondary | ICD-10-CM | POA: Diagnosis not present

## 2016-10-03 DIAGNOSIS — I671 Cerebral aneurysm, nonruptured: Secondary | ICD-10-CM | POA: Diagnosis not present

## 2016-10-03 DIAGNOSIS — Z79899 Other long term (current) drug therapy: Secondary | ICD-10-CM | POA: Diagnosis not present

## 2016-10-03 DIAGNOSIS — E785 Hyperlipidemia, unspecified: Secondary | ICD-10-CM | POA: Diagnosis not present

## 2016-10-06 DIAGNOSIS — Z23 Encounter for immunization: Secondary | ICD-10-CM | POA: Diagnosis not present

## 2016-10-06 DIAGNOSIS — R413 Other amnesia: Secondary | ICD-10-CM | POA: Diagnosis not present

## 2016-10-06 DIAGNOSIS — E785 Hyperlipidemia, unspecified: Secondary | ICD-10-CM | POA: Diagnosis not present

## 2016-10-06 DIAGNOSIS — M5136 Other intervertebral disc degeneration, lumbar region: Secondary | ICD-10-CM | POA: Diagnosis not present

## 2017-02-17 DIAGNOSIS — Z681 Body mass index (BMI) 19 or less, adult: Secondary | ICD-10-CM | POA: Diagnosis not present

## 2017-02-17 DIAGNOSIS — R413 Other amnesia: Secondary | ICD-10-CM | POA: Diagnosis not present

## 2017-03-27 DIAGNOSIS — C44321 Squamous cell carcinoma of skin of nose: Secondary | ICD-10-CM | POA: Diagnosis not present

## 2017-05-22 DIAGNOSIS — R413 Other amnesia: Secondary | ICD-10-CM | POA: Diagnosis not present

## 2017-06-02 DIAGNOSIS — L908 Other atrophic disorders of skin: Secondary | ICD-10-CM | POA: Diagnosis not present

## 2017-06-02 DIAGNOSIS — D0439 Carcinoma in situ of skin of other parts of face: Secondary | ICD-10-CM | POA: Diagnosis not present

## 2017-06-02 DIAGNOSIS — L814 Other melanin hyperpigmentation: Secondary | ICD-10-CM | POA: Diagnosis not present

## 2017-06-02 DIAGNOSIS — L578 Other skin changes due to chronic exposure to nonionizing radiation: Secondary | ICD-10-CM | POA: Diagnosis not present

## 2018-07-21 ENCOUNTER — Other Ambulatory Visit: Payer: Self-pay

## 2018-07-21 ENCOUNTER — Emergency Department (HOSPITAL_COMMUNITY): Payer: MEDICARE

## 2018-07-21 ENCOUNTER — Emergency Department (HOSPITAL_COMMUNITY)
Admission: EM | Admit: 2018-07-21 | Discharge: 2018-07-21 | Disposition: A | Discharge status: Home / Self Care | Payer: MEDICARE | Attending: Emergency Medicine | Admitting: Emergency Medicine

## 2018-07-21 ENCOUNTER — Encounter (HOSPITAL_COMMUNITY): Payer: Self-pay

## 2018-07-21 DIAGNOSIS — W19XXXA Unspecified fall, initial encounter: Secondary | ICD-10-CM | POA: Insufficient documentation

## 2018-07-21 DIAGNOSIS — S52501A Unspecified fracture of the lower end of right radius, initial encounter for closed fracture: Secondary | ICD-10-CM | POA: Diagnosis not present

## 2018-07-21 DIAGNOSIS — Y939 Activity, unspecified: Secondary | ICD-10-CM | POA: Diagnosis not present

## 2018-07-21 DIAGNOSIS — Y929 Unspecified place or not applicable: Secondary | ICD-10-CM | POA: Insufficient documentation

## 2018-07-21 DIAGNOSIS — Y999 Unspecified external cause status: Secondary | ICD-10-CM | POA: Insufficient documentation

## 2018-07-21 DIAGNOSIS — T1490XA Injury, unspecified, initial encounter: Secondary | ICD-10-CM

## 2018-07-21 DIAGNOSIS — S59911A Unspecified injury of right forearm, initial encounter: Secondary | ICD-10-CM | POA: Diagnosis present

## 2018-07-21 DIAGNOSIS — S52611A Displaced fracture of right ulna styloid process, initial encounter for closed fracture: Secondary | ICD-10-CM | POA: Diagnosis not present

## 2018-07-21 MED ORDER — HYDROCODONE-ACETAMINOPHEN 5-325 MG PO TABS: 1.0000 | ORAL_TABLET | Freq: Four times a day (QID) | ORAL | 0 refills | Status: DC | PRN

## 2018-07-21 NOTE — ED Triage Notes (Signed)
Pt says was pulling weeds yesterday and fell.  C/O pain in r wrist.  Wrist swollen.

## 2018-07-21 NOTE — Discharge Instructions (Signed)
Follow-up with Dr. Aline Brochure next week keep your arm elevated

## 2018-07-21 NOTE — ED Provider Notes (Signed)
[facility] EMERGENCY DEPARTMENT Provider Note   CSN: [id number] Arrival date & time: 07/21/18  6213     History   Chief Complaint Chief Complaint  Patient presents with  . Wrist Pain    HPI [name] is a 84 y.o. female.     Patient fell on her right wrist yesterday.  No other injuries  The history is provided by the patient. No language interpreter was used.  Wrist Pain This is a new problem. The current episode started 12 to 24 hours ago. The problem occurs constantly. The problem has not changed since onset.Pertinent negatives include no chest pain, no abdominal pain and no headaches. Nothing aggravates the symptoms. Nothing relieves the symptoms. She has tried nothing for the symptoms.    Past Medical History:  Diagnosis Date  . Double vision    started 1st part of December 2013-Has  a Cerebral Aneurysm  . High cholesterol   . Hyperlipidemia   . Kidney stone     Patient Active Problem List   Diagnosis Date Noted  . Cerebral aneurysm without rupture 01/23/2012    Class: Acute  . Distal radius fracture, left 09/05/2011    Past Surgical History:  Procedure Laterality Date  . BACK SURGERY    . CRANIOTOMY  01/23/2012   Procedure: CRANIOTOMY INTRACRANIAL ANEURYSM FOR CAROTID;  Surgeon: Winfield Cunas, MD;  Location: [place] [facility];  Service: Neurosurgery;  Laterality: Left;  Left Pterional Craniotomy for aneurysm clipping  . Left wrist     fracture 02/2011  . TUBAL LIGATION       OB History   No obstetric history on file.      Home Medications    Prior to Admission medications   Medication Sig Start Date End Date Taking? Authorizing Provider  acetaminophen-codeine (TYLENOL #3) 300-30 MG per tablet Take 1 tablet by mouth every 6 (six) hours as needed for pain. 01/28/12  Yes Ashok Pall, MD  HYDROcodone-acetaminophen (NORCO/VICODIN) 5-325 MG tablet Take 1 tablet by mouth every 6 (six) hours as needed. 07/21/18   Milton Ferguson, MD  levETIRAcetam  (KEPPRA) 500 MG tablet Take 1 tablet (500 mg total) by mouth every 12 (twelve) hours. Patient not taking: Reported on 07/21/2018 01/28/12   Ashok Pall, MD    Family History No family history on file.  Social History Social History   Tobacco Use  . Smoking status: Never Smoker  . Smokeless tobacco: Never Used  . Tobacco comment: quit over 20 years ago.  Substance Use Topics  . Alcohol use: No  . Drug use: No     Allergies   Patient has no known allergies.   Review of Systems Review of Systems  Constitutional: Negative for appetite change and fatigue.  HENT: Negative for congestion, ear discharge and sinus pressure.   Eyes: Negative for discharge.  Respiratory: Negative for cough.   Cardiovascular: Negative for chest pain.  Gastrointestinal: Negative for abdominal pain and diarrhea.  Genitourinary: Negative for frequency and hematuria.  Musculoskeletal: Negative for back pain.       Right wrist pain  Skin: Negative for rash.  Neurological: Negative for seizures and headaches.  Psychiatric/Behavioral: Negative for hallucinations.     Physical Exam Updated Vital Signs BP 96/72 (BP Location: Left Arm)   Pulse 66   Temp 98.5 F (36.9 C) (Oral)   Resp 16   Ht 5\' 5"  (1.651 m)   Wt 40.8 kg   SpO2 95%   BMI 14.98 kg/m  Physical Exam Vitals signs reviewed.  Constitutional:      Appearance: She is well-developed.  HENT:     Head: Normocephalic.  Eyes:     General: No scleral icterus.    Conjunctiva/sclera: Conjunctivae normal.  Neck:     Musculoskeletal: Neck supple.     Thyroid: No thyromegaly.  Cardiovascular:     Rate and Rhythm: Normal rate and regular rhythm.     Heart sounds: No murmur. No friction rub. No gallop.   Pulmonary:     Breath sounds: No stridor. No wheezing or rales.  Chest:     Chest wall: No tenderness.  Abdominal:     General: There is no distension.     Tenderness: There is no abdominal tenderness. There is no rebound.   Musculoskeletal:     Comments: Tender swollen right wrist with normal pulses neurovascular normal  Lymphadenopathy:     Cervical: No cervical adenopathy.  Skin:    Findings: No erythema or rash.  Neurological:     Mental Status: She is alert and oriented to person, place, and time.     Motor: No abnormal muscle tone.     Coordination: Coordination normal.  Psychiatric:        Behavior: Behavior normal.      ED Treatments / Results  Labs (all labs ordered are listed, but only abnormal results are displayed) Labs Reviewed - No data to display  EKG None  Radiology Dg Forearm Right  Result Date: 07/21/2018 CLINICAL DATA:  Right hand and wrist pain after fall on outstretched hand EXAM: RIGHT HAND - COMPLETE 3+ VIEW; RIGHT FOREARM - 2 VIEW COMPARISON:  None. FINDINGS: Acute minimally impacted fracture of the distal radius. No significant angulation. No definite extension to the articular surface of the radiocarpal joint. Additional nondisplaced fracture of the ulnar styloid. There is mild soft tissue swelling at the fracture sites. The bones are diffusely demineralized. No additional fracture is identified. Mild degenerative changes most pronounced at the triscaphe joint. Alignment at the elbow is maintained. IMPRESSION: Acute fractures of the distal radius and ulnar styloid, as described above. Electronically Signed   By: Davina Poke M.D.   On: 07/21/2018 10:06   Dg Hand Complete Right  Result Date: 07/21/2018 CLINICAL DATA:  Right hand and wrist pain after fall on outstretched hand EXAM: RIGHT HAND - COMPLETE 3+ VIEW; RIGHT FOREARM - 2 VIEW COMPARISON:  None. FINDINGS: Acute minimally impacted fracture of the distal radius. No significant angulation. No definite extension to the articular surface of the radiocarpal joint. Additional nondisplaced fracture of the ulnar styloid. There is mild soft tissue swelling at the fracture sites. The bones are diffusely demineralized. No  additional fracture is identified. Mild degenerative changes most pronounced at the triscaphe joint. Alignment at the elbow is maintained. IMPRESSION: Acute fractures of the distal radius and ulnar styloid, as described above. Electronically Signed   By: Davina Poke M.D.   On: 07/21/2018 10:06    Procedures Procedures (including critical care time)  Medications Ordered in ED Medications - No data to display   Initial Impression / Assessment and Plan / ED Course  I have reviewed the triage vital signs and the nursing notes.  Pertinent labs & imaging results that were available during my care of the patient were reviewed by me and considered in my medical decision making (see chart for details).        Patient with close distal radial fracture.  She will follow-up with orthopedics  and is given a sugar tong splint  Final Clinical Impressions(s) / ED Diagnoses   Final diagnoses:  Closed fracture of distal end of right radius, unspecified fracture morphology, initial encounter    ED Discharge Orders         Ordered    HYDROcodone-acetaminophen (NORCO/VICODIN) 5-325 MG tablet  Every 6 hours PRN     07/21/18 1223           Milton Ferguson, MD 07/21/18 1229

## 2018-07-26 ENCOUNTER — Other Ambulatory Visit: Payer: Self-pay

## 2018-07-26 ENCOUNTER — Ambulatory Visit (INDEPENDENT_AMBULATORY_CARE_PROVIDER_SITE_OTHER): Payer: MEDICARE | Admitting: Orthopedic Surgery

## 2018-07-26 ENCOUNTER — Encounter: Payer: Self-pay | Admitting: Orthopedic Surgery

## 2018-07-26 VITALS — BP 101/69 | HR 65 | Temp 98.2°F | Ht 65.0 in | Wt 89.0 lb

## 2018-07-26 DIAGNOSIS — S52531A Colles' fracture of right radius, initial encounter for closed fracture: Secondary | ICD-10-CM

## 2018-07-26 NOTE — Progress Notes (Signed)
SCOTT FIX  07/26/2018  HISTORY SECTION :  Chief Complaint  Patient presents with  . Wrist Injury    right wrist injury 07/21/18 fall    84 year old female status post fall complains of right wrist pain for 5 days Quality dull Severity mild Associated with no major wrist deformity mild swelling stiffness painful range of motion   Review of Systems  Reason unable to perform ROS: Unclear due to dementia.  All other systems reviewed and are negative.    Past Medical History:  Diagnosis Date  . Double vision    started 1st part of December 2013-Has  a Cerebral Aneurysm  . High cholesterol   . Hyperlipidemia   . Kidney stone     Past Surgical History:  Procedure Laterality Date  . BACK SURGERY    . CRANIOTOMY  01/23/2012   Procedure: CRANIOTOMY INTRACRANIAL ANEURYSM FOR CAROTID;  Surgeon: Winfield Cunas, MD;  Location: [place] [facility];  Service: Neurosurgery;  Laterality: Left;  Left Pterional Craniotomy for aneurysm clipping  . Left wrist     fracture 02/2011  . TUBAL LIGATION       No Known Allergies   Current Outpatient Medications:  .  HYDROcodone-acetaminophen (NORCO/VICODIN) 5-325 MG tablet, Take 1 tablet by mouth every 6 (six) hours as needed., Disp: 20 tablet, Rfl: 0 .  levETIRAcetam (KEPPRA) 500 MG tablet, Take 1 tablet (500 mg total) by mouth every 12 (twelve) hours. (Patient not taking: Reported on 07/21/2018), Disp: 50 tablet, Rfl: 0   PHYSICAL EXAM SECTION: 1) BP 101/69   Pulse 65   Temp 98.2 F (36.8 C)   Ht 5\' 5"  (1.651 m)   Wt 89 lb (40.4 kg)   BMI 14.81 kg/m   Body mass index is 14.81 kg/m. General appearance: Well-developed well-nourished no gross deformities  2) Cardiovascular normal pulse and perfusion in all 4 extremities normal color without edema  3) Neurologically deep tendon reflexes are equal and normal, no sensation loss or deficits no pathologic reflexes  4) Psychological: Awake alert and oriented x3 mood and affect normal  5)  Skin no lacerations or ulcerations no nodularity no palpable masses, no erythema or nodularity  6) Musculoskeletal:   Left wrist looks normal no swelling or tenderness normal range of motion  Right wrist is tender and there is swelling near the fracture site and into the hand skin is intact painful range of motion with wrist extension and flexion as well as pronation supination which are all diminished Weakened grip strength secondary to swelling   MEDICAL DECISION SECTION:  No diagnosis found.  [facility] has the film IM looking at it on the computer images I read it as nondisplaced fractures of the distal radius and ulnar styloid  Plan:   Short arm cast 6 weeks x-ray  Brace 4 weeks reexamine (Rx., Inj., surg., Frx, MRI/CT, XR:2)  9:28 AM

## 2018-07-26 NOTE — Patient Instructions (Signed)
Cast or Splint Care, Adult Casts and splints are supports that are worn to protect broken bones and other injuries. A cast or splint may hold a bone still and in the correct position while it heals. Casts and splints may also help ease pain, swelling, and muscle spasms. A cast is a hardened support that is usually made of fiberglass or plaster. It is custom-fit to the body and it offers more protection than a splint. It cannot be taken off and put back on. A splint is a type of soft support that is usually made from cloth and elastic. It can be adjusted or taken off as needed. You may need a cast or a splint if you:  Have a broken bone.  Have a soft-tissue injury.  Need to keep an injured body part from moving (keep it immobile) after surgery. How is this treated? If you have a cast:   Do not stick anything inside the cast to scratch your skin. Sticking something in the cast increases your risk of infection.  Check the skin around the cast every day. Tell your health care provider about any concerns.  You may put lotion on dry skin around the edges of the cast. Do not put lotion on the skin underneath the cast.  Keep the cast clean.  If the cast is not waterproof: ? Do not let it get wet. ? Cover it with a watertight covering when you take a bath or a shower. If you have a splint:   Wear it as told by your health care provider. Remove it only as told by your health care provider.  Loosen the splint if your fingers or toes tingle, become numb, or turn cold and blue.  Keep the splint clean.  If the splint is not waterproof: ? Do not let it get wet. ? Cover it with a watertight covering when you take a bath or a shower. Bathing  Do not take baths or swim until your health care provider approves. Ask your health care provider if you can take showers. You may only be allowed to take sponge baths for bathing.  If your cast or splint is not waterproof, cover it with a watertight  covering when you take a bath or shower. Managing pain, stiffness, and swelling  Move your fingers or toes often to avoid stiffness and to lessen swelling.  Raise (elevate) the injured area above the level of your heart while sitting or lying down. Safety  Do not use the injured limb to support your body weight until your health care provider says that it is okay.  Use crutches or other assistive devices as told by your health care provider. General instructions  Do not put pressure on any part of the cast or splint until it is fully hardened. This may take several hours.  Return to your normal activities as told by your health care provider. Ask your health care provider what activities are safe for you.  Take over-the-counter and prescription medicines only as told by your health care provider.  Keep all follow-up visits as told by your health care provider. This is important. Contact a health care provider if:  Your cast or splint gets damaged.  The skin around the cast gets red or raw.  The skin under the cast is extremely itchy or painful.  Your cast or splint feels very uncomfortable.  Your cast or splint is too tight or too loose.  Your cast becomes wet or it develops  a soft spot or area.  You get an object stuck under your cast. Get help right away if:  Your pain is getting worse.  The injured area tingles, becomes numb, or turns cold and blue.  The part of your body above or below the cast is swollen and discolored.  You cannot feel or move your fingers or toes.  There is fluid leaking through the cast.  You have severe pain or pressure under the cast.  You have trouble breathing.  You have shortness of breath.  You have chest pain. This information is not intended to replace advice given to you by your health care provider. Make sure you discuss any questions you have with your health care provider. Document Released: 12/21/1999 Document Revised:  10/20/2016 Document Reviewed: 06/16/2015 Elsevier Patient Education  2020 [employer].

## 2018-09-06 ENCOUNTER — Ambulatory Visit: Payer: MEDICARE

## 2018-09-06 ENCOUNTER — Ambulatory Visit (INDEPENDENT_AMBULATORY_CARE_PROVIDER_SITE_OTHER): Payer: MEDICARE | Admitting: Orthopedic Surgery

## 2018-09-06 ENCOUNTER — Other Ambulatory Visit: Payer: Self-pay

## 2018-09-06 VITALS — Temp 97.3°F | Ht 65.0 in | Wt 89.0 lb

## 2018-09-06 DIAGNOSIS — S62102D Fracture of unspecified carpal bone, left wrist, subsequent encounter for fracture with routine healing: Secondary | ICD-10-CM | POA: Diagnosis not present

## 2018-09-06 NOTE — Progress Notes (Signed)
Patient ID: [name], female   DOB: [birth year], 84 y.o.   MRN: [id number]  FRACTURE CARE   Chief Complaint  Patient presents with  . Follow-up    Recheck on right wrist fracture, DOI 07-26-18.    Encounter Diagnosis  Name Primary?  . Wrist fracture, closed, left, with routine healing, subsequent encounter Yes    Fracture right wrist dating July 15 this is day #46 status post cast treatment today for x-ray  Wrist examination is normal.  I released the patient today as her x-rays show fracture healing

## 2018-12-21 ENCOUNTER — Other Ambulatory Visit (HOSPITAL_COMMUNITY): Payer: Self-pay | Admitting: Internal Medicine

## 2018-12-21 ENCOUNTER — Ambulatory Visit (HOSPITAL_COMMUNITY)
Admission: RE | Admit: 2018-12-21 | Discharge: 2018-12-21 | Disposition: A | Discharge status: Home / Self Care | Payer: MEDICARE | Source: Ambulatory Visit | Attending: Internal Medicine | Admitting: Internal Medicine

## 2018-12-21 ENCOUNTER — Other Ambulatory Visit: Payer: Self-pay

## 2018-12-21 DIAGNOSIS — M25561 Pain in right knee: Secondary | ICD-10-CM

## 2019-02-03 ENCOUNTER — Encounter (HOSPITAL_COMMUNITY): Payer: Self-pay

## 2019-02-03 ENCOUNTER — Inpatient Hospital Stay (HOSPITAL_COMMUNITY): Payer: MEDICARE

## 2019-02-03 ENCOUNTER — Emergency Department (HOSPITAL_COMMUNITY)
Admission: EM | Admit: 2019-02-03 | Discharge: 2019-02-03 | Disposition: A | Discharge status: Home / Self Care | Payer: MEDICARE | Source: Home / Self Care | Attending: Emergency Medicine | Admitting: Emergency Medicine

## 2019-02-03 ENCOUNTER — Emergency Department (HOSPITAL_COMMUNITY): Payer: MEDICARE

## 2019-02-03 ENCOUNTER — Inpatient Hospital Stay (HOSPITAL_COMMUNITY)
Admission: EM | Admit: 2019-02-03 | Discharge: 2019-02-14 | DRG: 480 | Disposition: A | Discharge status: Skilled Nursing Facility | Payer: MEDICARE | Attending: Internal Medicine | Admitting: Internal Medicine

## 2019-02-03 ENCOUNTER — Other Ambulatory Visit: Payer: Self-pay

## 2019-02-03 DIAGNOSIS — S52031A Displaced fracture of olecranon process with intraarticular extension of right ulna, initial encounter for closed fracture: Secondary | ICD-10-CM | POA: Diagnosis present

## 2019-02-03 DIAGNOSIS — W19XXXA Unspecified fall, initial encounter: Secondary | ICD-10-CM | POA: Insufficient documentation

## 2019-02-03 DIAGNOSIS — Y92018 Other place in single-family (private) house as the place of occurrence of the external cause: Secondary | ICD-10-CM

## 2019-02-03 DIAGNOSIS — Z515 Encounter for palliative care: Secondary | ICD-10-CM | POA: Diagnosis not present

## 2019-02-03 DIAGNOSIS — S72361A Displaced segmental fracture of shaft of right femur, initial encounter for closed fracture: Secondary | ICD-10-CM | POA: Diagnosis present

## 2019-02-03 DIAGNOSIS — R7989 Other specified abnormal findings of blood chemistry: Secondary | ICD-10-CM | POA: Diagnosis not present

## 2019-02-03 DIAGNOSIS — Z419 Encounter for procedure for purposes other than remedying health state, unspecified: Secondary | ICD-10-CM

## 2019-02-03 DIAGNOSIS — R410 Disorientation, unspecified: Secondary | ICD-10-CM | POA: Diagnosis not present

## 2019-02-03 DIAGNOSIS — R404 Transient alteration of awareness: Secondary | ICD-10-CM | POA: Diagnosis not present

## 2019-02-03 DIAGNOSIS — M6281 Muscle weakness (generalized): Secondary | ICD-10-CM

## 2019-02-03 DIAGNOSIS — R339 Retention of urine, unspecified: Secondary | ICD-10-CM | POA: Diagnosis not present

## 2019-02-03 DIAGNOSIS — Z01818 Encounter for other preprocedural examination: Secondary | ICD-10-CM | POA: Diagnosis not present

## 2019-02-03 DIAGNOSIS — S7291XA Unspecified fracture of right femur, initial encounter for closed fracture: Secondary | ICD-10-CM | POA: Diagnosis not present

## 2019-02-03 DIAGNOSIS — W1839XA Other fall on same level, initial encounter: Secondary | ICD-10-CM | POA: Diagnosis present

## 2019-02-03 DIAGNOSIS — Z681 Body mass index (BMI) 19 or less, adult: Secondary | ICD-10-CM

## 2019-02-03 DIAGNOSIS — Z7189 Other specified counseling: Secondary | ICD-10-CM | POA: Diagnosis not present

## 2019-02-03 DIAGNOSIS — Z66 Do not resuscitate: Secondary | ICD-10-CM | POA: Diagnosis not present

## 2019-02-03 DIAGNOSIS — R451 Restlessness and agitation: Secondary | ICD-10-CM | POA: Diagnosis not present

## 2019-02-03 DIAGNOSIS — S72001A Fracture of unspecified part of neck of right femur, initial encounter for closed fracture: Secondary | ICD-10-CM | POA: Diagnosis not present

## 2019-02-03 DIAGNOSIS — S52001A Unspecified fracture of upper end of right ulna, initial encounter for closed fracture: Secondary | ICD-10-CM | POA: Diagnosis not present

## 2019-02-03 DIAGNOSIS — K5900 Constipation, unspecified: Secondary | ICD-10-CM | POA: Diagnosis not present

## 2019-02-03 DIAGNOSIS — D62 Acute posthemorrhagic anemia: Secondary | ICD-10-CM | POA: Diagnosis not present

## 2019-02-03 DIAGNOSIS — Z09 Encounter for follow-up examination after completed treatment for conditions other than malignant neoplasm: Secondary | ICD-10-CM

## 2019-02-03 DIAGNOSIS — U071 COVID-19: Secondary | ICD-10-CM | POA: Diagnosis present

## 2019-02-03 DIAGNOSIS — S72141A Displaced intertrochanteric fracture of right femur, initial encounter for closed fracture: Secondary | ICD-10-CM | POA: Diagnosis not present

## 2019-02-03 DIAGNOSIS — S52024D Nondisplaced fracture of olecranon process without intraarticular extension of right ulna, subsequent encounter for closed fracture with routine healing: Secondary | ICD-10-CM | POA: Diagnosis not present

## 2019-02-03 DIAGNOSIS — S52502A Unspecified fracture of the lower end of left radius, initial encounter for closed fracture: Secondary | ICD-10-CM | POA: Diagnosis present

## 2019-02-03 DIAGNOSIS — S3993XA Unspecified injury of pelvis, initial encounter: Secondary | ICD-10-CM | POA: Diagnosis not present

## 2019-02-03 DIAGNOSIS — I671 Cerebral aneurysm, nonruptured: Secondary | ICD-10-CM | POA: Diagnosis not present

## 2019-02-03 DIAGNOSIS — R42 Dizziness and giddiness: Secondary | ICD-10-CM | POA: Diagnosis not present

## 2019-02-03 DIAGNOSIS — Z743 Need for continuous supervision: Secondary | ICD-10-CM | POA: Diagnosis not present

## 2019-02-03 DIAGNOSIS — Y939 Activity, unspecified: Secondary | ICD-10-CM | POA: Insufficient documentation

## 2019-02-03 DIAGNOSIS — R0689 Other abnormalities of breathing: Secondary | ICD-10-CM | POA: Diagnosis not present

## 2019-02-03 DIAGNOSIS — M25561 Pain in right knee: Secondary | ICD-10-CM

## 2019-02-03 DIAGNOSIS — S0990XA Unspecified injury of head, initial encounter: Secondary | ICD-10-CM | POA: Diagnosis not present

## 2019-02-03 DIAGNOSIS — S199XXA Unspecified injury of neck, initial encounter: Secondary | ICD-10-CM | POA: Diagnosis not present

## 2019-02-03 DIAGNOSIS — R0902 Hypoxemia: Secondary | ICD-10-CM | POA: Diagnosis not present

## 2019-02-03 DIAGNOSIS — S728X1A Other fracture of right femur, initial encounter for closed fracture: Secondary | ICD-10-CM | POA: Diagnosis not present

## 2019-02-03 DIAGNOSIS — R296 Repeated falls: Secondary | ICD-10-CM | POA: Diagnosis present

## 2019-02-03 DIAGNOSIS — M25551 Pain in right hip: Secondary | ICD-10-CM | POA: Diagnosis not present

## 2019-02-03 DIAGNOSIS — Y999 Unspecified external cause status: Secondary | ICD-10-CM | POA: Insufficient documentation

## 2019-02-03 DIAGNOSIS — S299XXA Unspecified injury of thorax, initial encounter: Secondary | ICD-10-CM | POA: Diagnosis not present

## 2019-02-03 DIAGNOSIS — R627 Adult failure to thrive: Secondary | ICD-10-CM | POA: Diagnosis present

## 2019-02-03 DIAGNOSIS — Y92009 Unspecified place in unspecified non-institutional (private) residence as the place of occurrence of the external cause: Secondary | ICD-10-CM | POA: Insufficient documentation

## 2019-02-03 DIAGNOSIS — Z8679 Personal history of other diseases of the circulatory system: Secondary | ICD-10-CM | POA: Diagnosis not present

## 2019-02-03 DIAGNOSIS — F0390 Unspecified dementia without behavioral disturbance: Secondary | ICD-10-CM

## 2019-02-03 DIAGNOSIS — S52021A Displaced fracture of olecranon process without intraarticular extension of right ulna, initial encounter for closed fracture: Secondary | ICD-10-CM | POA: Diagnosis not present

## 2019-02-03 DIAGNOSIS — R269 Unspecified abnormalities of gait and mobility: Secondary | ICD-10-CM | POA: Diagnosis present

## 2019-02-03 DIAGNOSIS — R52 Pain, unspecified: Secondary | ICD-10-CM | POA: Diagnosis not present

## 2019-02-03 DIAGNOSIS — S72361D Displaced segmental fracture of shaft of right femur, subsequent encounter for closed fracture with routine healing: Secondary | ICD-10-CM | POA: Diagnosis not present

## 2019-02-03 DIAGNOSIS — M25572 Pain in left ankle and joints of left foot: Secondary | ICD-10-CM | POA: Diagnosis not present

## 2019-02-03 DIAGNOSIS — D649 Anemia, unspecified: Secondary | ICD-10-CM | POA: Diagnosis not present

## 2019-02-03 LAB — BASIC METABOLIC PANEL
Anion gap: 9 (ref 5–15)
BUN: 18 mg/dL (ref 8–23)
CO2: 26 mmol/L (ref 22–32)
Calcium: 9 mg/dL (ref 8.9–10.3)
Chloride: 101 mmol/L (ref 98–111)
Creatinine, Ser: 0.76 mg/dL (ref 0.44–1.00)
GFR calc Af Amer: >60 mL/min (ref >60)
GFR calc non Af Amer: >60 mL/min (ref >60)
Glucose, Bld: 129 mg/dL — ABNORMAL HIGH (ref 70–99)
Potassium: 4 mmol/L (ref 3.5–5.1)
Sodium: 136 mmol/L (ref 135–145)

## 2019-02-03 LAB — COMPREHENSIVE METABOLIC PANEL
ALT: 18 U/L (ref 0–44)
AST: 30 U/L (ref 15–41)
Albumin: 3.8 g/dL (ref 3.5–5.0)
Alkaline Phosphatase: 80 U/L (ref 38–126)
Anion gap: 8 (ref 5–15)
BUN: 19 mg/dL (ref 8–23)
CO2: 29 mmol/L (ref 22–32)
Calcium: 9.1 mg/dL (ref 8.9–10.3)
Chloride: 101 mmol/L (ref 98–111)
Creatinine, Ser: 0.71 mg/dL (ref 0.44–1.00)
GFR calc Af Amer: >60 mL/min (ref >60)
GFR calc non Af Amer: >60 mL/min (ref >60)
Glucose, Bld: 116 mg/dL — ABNORMAL HIGH (ref 70–99)
Potassium: 4.2 mmol/L (ref 3.5–5.1)
Sodium: 138 mmol/L (ref 135–145)
Total Bilirubin: 0.9 mg/dL (ref 0.3–1.2)
Total Protein: 7.1 g/dL (ref 6.5–8.1)

## 2019-02-03 LAB — URINALYSIS, ROUTINE W REFLEX MICROSCOPIC
Bilirubin Urine: NEGATIVE
Glucose, UA: NEGATIVE mg/dL
Hgb urine dipstick: NEGATIVE
Ketones, ur: NEGATIVE mg/dL
Leukocytes,Ua: NEGATIVE
Nitrite: NEGATIVE
Protein, ur: NEGATIVE mg/dL
Specific Gravity, Urine: 1.016 (ref 1.005–1.030)
pH: 8 (ref 5.0–8.0)

## 2019-02-03 LAB — TROPONIN I (HIGH SENSITIVITY)
Troponin I (High Sensitivity): <2 ng/L (ref <18)
Troponin I (High Sensitivity): 3 ng/L (ref <18)

## 2019-02-03 LAB — CBC
HCT: 38.6 % (ref 36.0–46.0)
Hemoglobin: 12.3 g/dL (ref 12.0–15.0)
MCH: 31.9 pg (ref 26.0–34.0)
MCHC: 31.9 g/dL (ref 30.0–36.0)
MCV: 100 fL (ref 80.0–100.0)
Platelets: 316 10*3/uL (ref 150–400)
RBC: 3.86 MIL/uL — ABNORMAL LOW (ref 3.87–5.11)
RDW: 12.6 % (ref 11.5–15.5)
WBC: 9.5 10*3/uL (ref 4.0–10.5)
nRBC: 0 % (ref 0.0–0.2)

## 2019-02-03 LAB — RESPIRATORY PANEL BY RT PCR (FLU A&B, COVID)
Influenza A by PCR: NEGATIVE
Influenza B by PCR: NEGATIVE
SARS Coronavirus 2 by RT PCR: POSITIVE — AB

## 2019-02-03 LAB — CBC WITH DIFFERENTIAL/PLATELET
Abs Immature Granulocytes: 0.02 10*3/uL (ref 0.00–0.07)
Basophils Absolute: 0 10*3/uL (ref 0.0–0.1)
Basophils Relative: 0 %
Eosinophils Absolute: 0 10*3/uL (ref 0.0–0.5)
Eosinophils Relative: 0 %
HCT: 41 % (ref 36.0–46.0)
Hemoglobin: 13 g/dL (ref 12.0–15.0)
Immature Granulocytes: 0 %
Lymphocytes Relative: 9 %
Lymphs Abs: 0.7 10*3/uL (ref 0.7–4.0)
MCH: 31.2 pg (ref 26.0–34.0)
MCHC: 31.7 g/dL (ref 30.0–36.0)
MCV: 98.3 fL (ref 80.0–100.0)
Monocytes Absolute: 0.8 10*3/uL (ref 0.1–1.0)
Monocytes Relative: 9 %
Neutro Abs: 6.9 10*3/uL (ref 1.7–7.7)
Neutrophils Relative %: 82 %
Platelets: 298 10*3/uL (ref 150–400)
RBC: 4.17 MIL/uL (ref 3.87–5.11)
RDW: 12.3 % (ref 11.5–15.5)
WBC: 8.5 10*3/uL (ref 4.0–10.5)
nRBC: 0 % (ref 0.0–0.2)

## 2019-02-03 LAB — CK: Total CK: 154 U/L (ref 38–234)

## 2019-02-03 MED ORDER — VITAMIN C 500 MG/5ML PO SYRP
500.0000 mg | ORAL_SOLUTION | Freq: Two times a day (BID) | ORAL | Status: DC
  Filled 2019-02-03 (×8): qty 5

## 2019-02-03 MED ORDER — FENTANYL CITRATE (PF) 100 MCG/2ML IJ SOLN
25.0000 ug | Freq: Once | INTRAMUSCULAR | Status: AC
  Administered 2019-02-03: 25 ug via INTRAVENOUS
  Filled 2019-02-03: qty 2

## 2019-02-03 MED ORDER — BISACODYL 10 MG RE SUPP: 10.0000 mg | Freq: Every day | RECTAL | Status: DC | PRN

## 2019-02-03 MED ORDER — ZINC SULFATE 220 (50 ZN) MG PO CAPS
220.0000 mg | ORAL_CAPSULE | Freq: Every day | ORAL | Status: DC
  Administered 2019-02-05 – 2019-02-14 (×9): 220 mg via ORAL
  Filled 2019-02-03 (×9): qty 1

## 2019-02-03 MED ORDER — METHOCARBAMOL 1000 MG/10ML IJ SOLN: 500.0000 mg | Freq: Four times a day (QID) | INTRAVENOUS | Status: DC | PRN

## 2019-02-03 MED ORDER — HYDROCODONE-ACETAMINOPHEN 5-325 MG PO TABS: 1.0000 | ORAL_TABLET | Freq: Four times a day (QID) | ORAL | Status: DC | PRN

## 2019-02-03 MED ORDER — VITAMIN D 25 MCG (1000 UNIT) PO TABS
1000.0000 [IU] | ORAL_TABLET | Freq: Every day | ORAL | Status: DC
  Administered 2019-02-05 – 2019-02-14 (×9): 1000 [IU] via ORAL
  Filled 2019-02-03 (×14): qty 1

## 2019-02-03 MED ORDER — ENOXAPARIN SODIUM 40 MG/0.4ML ~~LOC~~ SOLN
40.0000 mg | SUBCUTANEOUS | Status: DC
  Administered 2019-02-03: 23:00:00 40 mg via SUBCUTANEOUS
  Filled 2019-02-03: qty 0.4

## 2019-02-03 MED ORDER — MAGNESIUM CITRATE PO SOLN: 1.0000 | Freq: Once | ORAL | Status: DC | PRN

## 2019-02-03 MED ORDER — POLYETHYLENE GLYCOL 3350 17 G PO PACK: 17.0000 g | PACK | Freq: Every day | ORAL | Status: DC | PRN

## 2019-02-03 MED ORDER — MORPHINE SULFATE (PF) 2 MG/ML IV SOLN
0.5000 mg | INTRAVENOUS | Status: DC | PRN
  Administered 2019-02-03 – 2019-02-04 (×5): 0.5 mg via INTRAVENOUS
  Filled 2019-02-03 (×5): qty 1

## 2019-02-03 MED ORDER — SODIUM CHLORIDE 0.9 % IV BOLUS
500.0000 mL | Freq: Once | INTRAVENOUS | Status: AC
  Administered 2019-02-03: 12:00:00 500 mL via INTRAVENOUS

## 2019-02-03 MED ORDER — LEVETIRACETAM 500 MG PO TABS
500.0000 mg | ORAL_TABLET | Freq: Two times a day (BID) | ORAL | Status: DC
  Filled 2019-02-03: qty 1

## 2019-02-03 MED ORDER — LACTATED RINGERS IV SOLN: INTRAVENOUS | Status: DC

## 2019-02-03 MED ORDER — METHOCARBAMOL 500 MG PO TABS: 500.0000 mg | ORAL_TABLET | Freq: Four times a day (QID) | ORAL | Status: DC | PRN

## 2019-02-03 NOTE — ED Notes (Signed)
Pt taken to CT.

## 2019-02-03 NOTE — Discharge Instructions (Addendum)
Follow-up with Dr. Aline Brochure next week.  Take Tylenol for pain

## 2019-02-03 NOTE — ED Notes (Signed)
[name] called per order of Dr Sedonia Small.

## 2019-02-03 NOTE — ED Provider Notes (Signed)
[facility] Emergency Department Provider Note MRN:  [id number]  Arrival date & time: 02/03/19     Chief Complaint   Leg Pain   History of Present Illness   [name] is a 85 y.o. year-old female with a history of dementia presenting to the ED with chief complaint of fall.  Patient was recently seen in the emergency department and diagnosed with a radius fracture.  About 1 hour after returning home, she had a another fall and is now exhibiting right hip pain with obvious deformity.  I was unable to obtain an accurate HPI, PMH, or ROS due to the patient's dementia.  Level 5 caveat.  Review of Systems  Positive for fall, hip pain.  Patient's Health History    Past Medical History:  Diagnosis Date  . Double vision    started 1st part of December 2013-Has  a Cerebral Aneurysm  . High cholesterol   . Hyperlipidemia   . Kidney stone     Past Surgical History:  Procedure Laterality Date  . BACK SURGERY    . CRANIOTOMY  01/23/2012   Procedure: CRANIOTOMY INTRACRANIAL ANEURYSM FOR CAROTID;  Surgeon: Winfield Cunas, MD;  Location: [place] [facility];  Service: Neurosurgery;  Laterality: Left;  Left Pterional Craniotomy for aneurysm clipping  . Left wrist     fracture 02/2011  . TUBAL LIGATION      Family History  Family history unknown: Yes    Social History   Socioeconomic History  . Marital status: Widowed    Spouse name: Not on file  . Number of children: Not on file  . Years of education: Not on file  . Highest education level: Not on file  Occupational History  . Not on file  Tobacco Use  . Smoking status: Never Smoker  . Smokeless tobacco: Never Used  . Tobacco comment: quit over 20 years ago.  Substance and Sexual Activity  . Alcohol use: No  . Drug use: No  . Sexual activity: Not on file  Other Topics Concern  . Not on file  Social History Narrative  . Not on file   Social Determinants of Health   Financial Resource Strain:   .  Difficulty of Paying Living Expenses: Not on file  Food Insecurity:   . Worried About Charity fundraiser in the Last Year: Not on file  . Ran Out of Food in the Last Year: Not on file  Transportation Needs:   . Lack of Transportation (Medical): Not on file  . Lack of Transportation (Non-Medical): Not on file  Physical Activity:   . Days of Exercise per Week: Not on file  . Minutes of Exercise per Session: Not on file  Stress:   . Feeling of Stress : Not on file  Social Connections:   . Frequency of Communication with Friends and Family: Not on file  . Frequency of Social Gatherings with Friends and Family: Not on file  . Attends Religious Services: Not on file  . Active Member of Clubs or Organizations: Not on file  . Attends Archivist Meetings: Not on file  . Marital Status: Not on file  Intimate Partner Violence:   . Fear of Current or Ex-Partner: Not on file  . Emotionally Abused: Not on file  . Physically Abused: Not on file  . Sexually Abused: Not on file     Physical Exam   Vitals:   02/03/19 2000 02/03/19 2030  BP: 101/72 122/70  Pulse: 80 82  Resp:    Temp:    SpO2: 100% 100%    CONSTITUTIONAL: Well-appearing, NAD NEURO: Awake, oriented only to name, nonfocal neurological exam EYES:  eyes equal and reactive ENT/NECK:  no LAD, no JVD CARDIO: Regular rate, well-perfused, normal S1 and S2 PULM:  CTAB no wheezing or rhonchi GI/GU:  normal bowel sounds, non-distended, non-tender MSK/SPINE:  No gross deformities, no edema, foreshortened and internally rotated right lower extremity, neurovascularly intact with strong DP pulse SKIN:  no rash, atraumatic PSYCH:  Appropriate speech and behavior  *Additional and/or pertinent findings included in MDM below  Diagnostic and Interventional Summary    EKG Interpretation  Date/Time:    Ventricular Rate:    PR Interval:    QRS Duration:   QT Interval:    QTC Calculation:   R Axis:     Text  Interpretation:        Cardiac Monitoring Interpretation:  Labs Reviewed  RESPIRATORY PANEL BY RT PCR (FLU A&B, COVID) - Abnormal; Notable for the following components:      Result Value   SARS Coronavirus 2 by RT PCR POSITIVE (*)    All other components within normal limits  CBC - Abnormal; Notable for the following components:   RBC 3.86 (*)    All other components within normal limits  BASIC METABOLIC PANEL - Abnormal; Notable for the following components:   Glucose, Bld 129 (*)    All other components within normal limits  CBC  BASIC METABOLIC PANEL    DG Femur Portable Min 2 Views Right  Final Result    DG Pelvis 1-2 Views  Final Result    CT Hip Right Wo Contrast    (Results Pending)    Medications  levETIRAcetam (KEPPRA) tablet 500 mg (has no administration in time range)  HYDROcodone-acetaminophen (NORCO/VICODIN) 5-325 MG per tablet 1-2 tablet (has no administration in time range)  morphine 2 MG/ML injection 0.5 mg (0.5 mg Intravenous Given 02/03/19 2050)  enoxaparin (LOVENOX) injection 40 mg (has no administration in time range)  lactated ringers infusion (has no administration in time range)  methocarbamol (ROBAXIN) tablet 500 mg (has no administration in time range)    Or  methocarbamol (ROBAXIN) 500 mg in dextrose 5 % 50 mL IVPB (has no administration in time range)  polyethylene glycol (MIRALAX / GLYCOLAX) packet 17 g (has no administration in time range)  bisacodyl (DULCOLAX) suppository 10 mg (has no administration in time range)  magnesium citrate solution 1 Bottle (has no administration in time range)  zinc sulfate capsule 220 mg (has no administration in time range)  ascorbic acid (VITAMIN C) 500 MG/5ML syrup 500 mg (has no administration in time range)  Vitamin D3 (Vitamin D) tablet 1,000 Units (has no administration in time range)  fentaNYL (SUBLIMAZE) injection 25 mcg (25 mcg Intravenous Given 02/03/19 1900)     Procedures  /  Critical  Care Procedures  ED Course and Medical Decision Making  I have reviewed the triage vital signs, the nursing notes, and pertinent available records from the EMR.  Pertinent labs & imaging results that were available during my care of the patient were reviewed by me and considered in my medical decision making (see below for details).     Clinical concern for hip fracture, x-rays to confirm.  Normal vital signs, no evidence of head trauma, attempt to contact daughter x1, no answer.  X-rays confirm proximal femur fracture with angulation.  Patient continues to be neurovascularly intact.  She has incidentally tested positive for COVID-19.  This raises concerns from our orthopedic colleagues here at [facility], unable to perform this procedure here.  Discussed case with Dr. Griffin Basil of [facility], who will evaluate patient in the morning at [facility].  To be admitted to [facility] by hospitalist service.  Barth Kirks. Sedonia Small, MD [place] [email or web address]  Final Clinical Impressions(s) / ED Diagnoses     ICD-10-CM   1. Closed fracture of right hip, initial encounter [facility])  S72.001A     ED Discharge Orders    None       Discharge Instructions Discussed with and Provided to Patient:   Discharge Instructions   None       Maudie Flakes, MD 02/03/19 2114

## 2019-02-03 NOTE — ED Triage Notes (Addendum)
Pt brought from home by EMS. Pt is confused which is baseline. Pt had unwitnessed fall. Time down unknown. Pt has slight swelling to upper and hematoma to right elbow.Pt able to walk to BR at her house. No one staying at house with pt since daughter has covid. Last seen by family yesterday 3 pm and found by daughter at 9 pm

## 2019-02-03 NOTE — ED Notes (Addendum)
Pt daughter called and inquired about visitation and being with mother. Pt daughter states tested positive for COVID-19 9 days ago, only contact daughter has had with pt since dx was this am while wearing mask.  Pt daughter aware of ED visitation policy. Pt daughter left contact information for pt updates.  [phone]

## 2019-02-03 NOTE — ED Triage Notes (Signed)
Pt brought by EMS . Reports pt fell approx one hour after returning home. Daughter reports she turned her back and mother fell in floor. EMS reports right lower leg at 90 degree angle with internal rotation. 50 mcg fentanyl given

## 2019-02-03 NOTE — Progress Notes (Signed)
Orthopedics a [facility] consulted For right hip fracture in Covid positive patient. Reportedly [name] is not able to handle this Due to room size. Er Requested transfer to [facility]ist at [facility] and [facility] will consult formally tomorrow. Would plan for surgery tomorrow.

## 2019-02-03 NOTE — H&P (Signed)
History and Physical    Patient Demographics:    [name] [id number] DOB: 22-Apr-1934 DOA: 02/03/2019  PCP: [name], MD  Patient coming from: Home  I have personally briefly reviewed patient's old medical records in [place]  Chief Complaint: Fall   Assessment & Plan:     Assessment/Plan Principal Problem:   Displaced segmental fracture of shaft of right femur, initial encounter for closed fracture [facility]) Active Problems:   Distal radius fracture, left   Dementia ([place])     Principal Problem: Right femur fracture Patient had a mechanical fall at home and sustained injury to the right hip and thigh.  Found to have a displaced proximal right femur fracture with significant limb shortening. -Bedrest -Keep n.p.o. -Pain meds as needed -Orthopedic surgery consult in a.m. -Neurovascular checks -preop EKG has been ordered and is pending  Other Active Problems:  Right elbow fracture Patient had a mechanical fall on 02/03/2019 and was noted to have a distracted intra-articular olecranon fracture with significant overlying soft tissue swelling. -continue right arm splint with sling -pain meds prn -will need ortho eval and follow up  COVID-19 infection: Patient noted to incidentally positive for COVID-19.  No symptoms.  Chest x-ray shows no infiltrates.  No fever noted.  Oxygenation is normal. 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 -Stable, continue Keppra  Dementia Patient is confused at baseline.  DVT prophylaxis: Lovenox Code Status:  Full code Family Communication: N/A  Disposition Plan: Admitted as inpatient for right femur fracture, plan for orthopedics consult in a.m. for surgical intervention. Consults called: N/A Admission  status: Inpatient status    HPI:     HPI: [name] is a 85 y.o. female with medical history significant of dementia, cerebral artery aneurysm who presented to the ER with right leg injury following a mechanical fall.  Patient has a history of ambulatory dysfunction and recurrent falls.  She did have a fall earlier in the day and sustained injury to the right elbow.  She was found to have a right intra-articular olecranon fracture and was given a posterior splint and discharged home.  She had another fall after returning home and sustained injury to the right leg.  No fever, chills, chest pain, shortness of breath, nausea, vomiting, abdominal pain, or diarrhea.  No dysuria, dizziness, lightheadedness, syncope, seizures. ED Course:  Vital Signs reviewed on presentation, significant for temperature 98.9, heart rate 85, blood pressure 137/77, saturation 97% on room air. Labs reviewed, significant for sodium 136, potassium 4.0, BUN 18, creatinine 0.7, troponin negative, total CK 154, WBC count 9.5, hemoglobin 12.3, hematocrit 38, platelets 316.  Urinalysis is negative. Imaging personally Reviewed, pelvis and right femur x-ray shows acute fracture of the proximal right femur with lateral angulation just below the intertrochanteric region.  Mild to moderate degenerative changes involving both hips.    Review of systems:    Review of Systems: As per HPI otherwise 10 point review of systems negative.  All other review of systems is negative except the ones noted above in the HPI.    Past Medical and Surgical History:  Reviewed by me  Past Medical History:  Diagnosis Date  . Double vision    started 1st part of December 2013-Has  a Cerebral Aneurysm  . High cholesterol   . Hyperlipidemia   .  Kidney stone     Past Surgical History:  Procedure Laterality Date  . BACK SURGERY    . CRANIOTOMY  01/23/2012   Procedure: CRANIOTOMY INTRACRANIAL ANEURYSM FOR CAROTID;  Surgeon: Winfield Cunas,  MD;  Location: [facility];  Service: Neurosurgery;  Laterality: Left;  Left Pterional Craniotomy for aneurysm clipping  . Left wrist     fracture 02/2011  . TUBAL LIGATION       Social History:  Reviewed by me   reports that she has never smoked. She has never used smokeless tobacco. She reports that she does not drink alcohol or use drugs.  Allergies:    No Known Allergies  Family History :   Family History  Family history unknown: Yes   Family history reviewed, noted as above, not pertinent to current presentation.   Home Medications:    Prior to Admission medications   Medication Sig Start Date End Date Taking? Authorizing Provider  HYDROcodone-acetaminophen (NORCO/VICODIN) 5-325 MG tablet Take 1 tablet by mouth every 6 (six) hours as needed. Patient not taking: Reported on 09/06/2018 07/21/18   Milton Ferguson, MD  levETIRAcetam (KEPPRA) 500 MG tablet Take 1 tablet (500 mg total) by mouth every 12 (twelve) hours. Patient not taking: Reported on 07/21/2018 01/28/12   Ashok Pall, MD    Physical Exam:    Physical Exam: Vitals:   02/03/19 1756 02/03/19 1757 02/03/19 1900  BP: 137/77  122/84  Pulse: 85  94  Resp: 18    Temp: 98.9 F (37.2 C)    TempSrc: Oral    SpO2: 97%  98%  Weight:  41 kg   Height:  5\' 6"  (1.676 m)     Constitutional: NAD, calm, comfortable Vitals:   02/03/19 1756 02/03/19 1757 02/03/19 1900  BP: 137/77  122/84  Pulse: 85  94  Resp: 18    Temp: 98.9 F (37.2 C)    TempSrc: Oral    SpO2: 97%  98%  Weight:  41 kg   Height:  5\' 6"  (1.676 m)    Eyes: PERRL, lids and conjunctivae normal ENMT: Mucous membranes are moist. Posterior pharynx clear of any exudate or lesions.Normal dentition.  Neck: normal, supple, no masses, no thyromegaly Respiratory: clear to auscultation bilaterally, no wheezing, no crackles. Normal respiratory effort. No accessory muscle use.  Cardiovascular: Regular rate and rhythm, no murmurs / rubs / gallops. No  extremity edema. 2+ pedal pulses. No carotid bruits.  Abdomen: no tenderness, no masses palpated. No hepatosplenomegaly. Bowel sounds positive.  Musculoskeletal: no clubbing / cyanosis.  Right lower extremity is very obviously shortened and internally rotated with deformity noted at the upper thigh, sensations intact, peripheral pulses palpable, right elbow in splint. skin: no rashes, lesions, ulcers. No induration Neurologic: no obvious focal deficits, exam is limited due to baseline history of dementia.  Moving all 4 extremities, right lower extremity movement is limited due to pain and deformity.  No obvious cranial nerve deficits. Psychiatric: Judgement and thought content abnormal, confused at baseline   Decubitus Ulcers: Not present on admission Catheters and tubes: None  Data Review:    Labs on Admission: I have personally reviewed following labs and imaging studies  CBC: Recent Labs  Lab 02/03/19 1026 02/03/19 1831  WBC 8.5 9.5  NEUTROABS 6.9  --   HGB 13.0 12.3  HCT 41.0 38.6  MCV 98.3 100.0  PLT 298 [id number]   Basic Metabolic Panel: Recent Labs  Lab 02/03/19 1026 02/03/19 1831  NA 138 136  K 4.2 4.0  CL 101 101  CO2 29 26  GLUCOSE 116* 129*  BUN 19 18  CREATININE 0.71 0.76  CALCIUM 9.1 9.0   GFR: Estimated Creatinine Clearance: 35.1 mL/min (by C-G formula based on SCr of 0.76 mg/dL). Liver Function Tests: Recent Labs  Lab 02/03/19 1026  AST 30  ALT 18  ALKPHOS 80  BILITOT 0.9  PROT 7.1  ALBUMIN 3.8   No results for input(s): LIPASE, AMYLASE in the last 168 hours. No results for input(s): AMMONIA in the last 168 hours. Coagulation Profile: No results for input(s): INR, PROTIME in the last 168 hours. Cardiac Enzymes: Recent Labs  Lab 02/03/19 1026  CKTOTAL 154   BNP (last 3 results) No results for input(s): PROBNP in the last 8760 hours. HbA1C: No results for input(s): HGBA1C in the last 72 hours. CBG: No results for input(s): GLUCAP in the last  168 hours. Lipid Profile: No results for input(s): CHOL, HDL, LDLCALC, TRIG, CHOLHDL, LDLDIRECT in the last 72 hours. Thyroid Function Tests: No results for input(s): TSH, T4TOTAL, FREET4, T3FREE, THYROIDAB in the last 72 hours. Anemia Panel: No results for input(s): VITAMINB12, FOLATE, FERRITIN, TIBC, IRON, RETICCTPCT in the last 72 hours. Urine analysis:    Component Value Date/Time   COLORURINE YELLOW 02/03/2019 1255   APPEARANCEUR HAZY (A) 02/03/2019 1255   LABSPEC 1.016 02/03/2019 1255   PHURINE 8.0 02/03/2019 1255   GLUCOSEU NEGATIVE 02/03/2019 1255   HGBUR NEGATIVE 02/03/2019 1255   [place] 02/03/2019 [place] 02/03/2019 1255   PROTEINUR NEGATIVE 02/03/2019 1255   UROBILINOGEN 0.2 07/27/2007 1016   NITRITE NEGATIVE 02/03/2019 1255   LEUKOCYTESUR NEGATIVE 02/03/2019 1255     Imaging Results:      Radiological Exams on Admission: DG Chest 2 View  Result Date: 02/03/2019 CLINICAL DATA:  Fall, confusion at baseline EXAM: CHEST - 2 VIEW COMPARISON:  In a 01/23/2012, 01/27/2008 FINDINGS: Normal heart size, mediastinal contours, and pulmonary vascularity. Atherosclerotic calcification aorta. Emphysematous and bronchitic changes consistent with COPD. Interstitial prominence, chronic. No infiltrate, pleural effusion or pneumothorax. Bones demineralized with mark compression deformity of a lower thoracic vertebra, age indeterminate but new since 2010. IMPRESSION: COPD changes without acute infiltrate. Age-indeterminate compression fracture of a lower thoracic vertebra. Electronically Signed   By: Lavonia Dana M.D.   On: 02/03/2019 10:59   DG Pelvis 1-2 Views  Result Date: 02/03/2019 CLINICAL DATA:  Status post fall. EXAM: PELVIS - 1-2 VIEW COMPARISON:  None. FINDINGS: An acute fracture deformity is seen involving the proximal right femoral shaft. This is just below the intertrochanteric region. Lateral angulation of the fracture site is seen. Mild to  moderate severity degenerative changes seen involving the bilateral hips. IMPRESSION: Acute fracture of the proximal right femur. Electronically Signed   By: Virgina Norfolk M.D.   On: 02/03/2019 19:11   DG Pelvis 1-2 Views  Result Date: 02/03/2019 CLINICAL DATA:  Fall, baseline confusion, pain in pelvis when moving for imaging EXAM: PELVIS - 1-2 VIEW COMPARISON:  None FINDINGS: Osseous demineralization. RIGHT SI joint poorly profiled. At LEFT SI joint and hip joint spaces preserved. No definite fracture, dislocation, or bone destruction. IMPRESSION: No definite acute osseous abnormalities. Electronically Signed   By: Lavonia Dana M.D.   On: 02/03/2019 11:00   DG Elbow Complete Right  Result Date: 02/03/2019 CLINICAL DATA:  Unwitnessed fall, swelling and bruising RIGHT elbow EXAM: RIGHT ELBOW - COMPLETE 3+ VIEW COMPARISON:  None FINDINGS: Osseous demineralization. Soft tissue swelling  overlying the elbow and proximal forearm. Intra-articular olecranon fracture, proximal fragment distracted 2.3 cm. Humerus and radius intact. IMPRESSION: Distracted intra-articular olecranon fracture with significant overlying soft tissue swelling. Electronically Signed   By: Lavonia Dana M.D.   On: 02/03/2019 10:57   CT Head Wo Contrast  Result Date: 02/03/2019 CLINICAL DATA:  Trauma to the head and neck. EXAM: CT HEAD WITHOUT CONTRAST CT CERVICAL SPINE WITHOUT CONTRAST TECHNIQUE: Multidetector CT imaging of the head and cervical spine was performed following the standard protocol without intravenous contrast. Multiplanar CT image reconstructions of the cervical spine were also generated. COMPARISON:  01/27/2012 FINDINGS: CT HEAD FINDINGS Brain: The brain does not show accelerated generalized atrophy. No focal finding affects the brainstem or cerebellum. 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 Mild chronic small-vessel change of the hemispheric  white matter. No hydrocephalus, hemorrhage or extra-axial collection. Vascular: There is atherosclerotic calcification of the major vessels at the base of the brain. Skull: Left pterional craniotomy for previous aneurysm clipping. Otherwise negative. Sinuses/Orbits: Clear/normal Other: None CT CERVICAL SPINE FINDINGS Alignment: Normal except for 2 mm of degenerative anterolisthesis at C4-5. Skull base and vertebrae: No skull base fracture. No cervical spine fracture. Compression fracture at the superior endplate of T1 with loss of height of 10% or less. This is age indeterminate but could be recent. Soft tissues and spinal canal: Negative Disc levels: Chronic spondylosis at C5-6. Mild bilateral bony foraminal narrowing. Chronic facet osteoarthritis on the left at C4-5. Left bony foraminal encroachment because of this. Upper chest: Negative Other: None IMPRESSION: Head CT: No acute or traumatic finding. Previous left pterional craniotomy for aneurysm clipping. Cervical spine CT: No cervical spine fracture. Minimal superior endplate compression fracture at T1 with loss of height of 10%. This is age indeterminate but could possibly be recent. Degenerative spondylosis at C5-6. Degenerative facet arthritis on the left at C4-5. Electronically Signed   By: Nelson Chimes M.D.   On: 02/03/2019 11:13   CT Cervical Spine Wo Contrast  Result Date: 02/03/2019 CLINICAL DATA:  Trauma to the head and neck. EXAM: CT HEAD WITHOUT CONTRAST CT CERVICAL SPINE WITHOUT CONTRAST TECHNIQUE: Multidetector CT imaging of the head and cervical spine was performed following the standard protocol without intravenous contrast. Multiplanar CT image reconstructions of the cervical spine were also generated. COMPARISON:  01/27/2012 FINDINGS: CT HEAD FINDINGS Brain: The brain does not show accelerated generalized atrophy. No focal finding affects the brainstem or cerebellum. Previous aneurysm clipping at the base of the brain on the left. Old small  vessel infarction in the left basal ganglia. Mild chronic small-vessel change of the hemispheric white matter. No hydrocephalus, hemorrhage or extra-axial collection. Vascular: There is atherosclerotic calcification of the major vessels at the base of the brain. Skull: Left pterional craniotomy for previous aneurysm clipping. Otherwise negative. Sinuses/Orbits: Clear/normal Other: None CT CERVICAL SPINE FINDINGS Alignment: Normal except for 2 mm of degenerative anterolisthesis at C4-5. Skull base and vertebrae: No skull base fracture. No cervical spine fracture. Compression fracture at the superior endplate of T1 with loss of height of 10% or less. This is age indeterminate but could be recent. Soft tissues and spinal canal: Negative Disc levels: Chronic spondylosis at C5-6. Mild bilateral bony foraminal narrowing. Chronic facet osteoarthritis on the left at C4-5. Left bony foraminal encroachment because of this. Upper chest: Negative Other: None IMPRESSION: Head CT: No acute or traumatic finding. Previous left pterional craniotomy  for aneurysm clipping. Cervical spine CT: No cervical spine fracture. Minimal superior endplate compression fracture at T1 with loss of height of 10%. This is age indeterminate but could possibly be recent. Degenerative spondylosis at C5-6. Degenerative facet arthritis on the left at C4-5. Electronically Signed   By: Nelson Chimes M.D.   On: 02/03/2019 11:13   DG Femur Portable Min 2 Views Right  Result Date: 02/03/2019 CLINICAL DATA:  Status post fall. EXAM: RIGHT FEMUR PORTABLE 2 VIEW COMPARISON:  None. FINDINGS: Acute fracture deformity is seen involving shaft of the proximal right femur. This extends to the level just below the intertrochanteric region. Superolateral angulation of the fracture site is seen. There is no evidence of dislocation. IMPRESSION: Acute fracture of the proximal right femur. Electronically Signed   By: Virgina Norfolk M.D.   On: 02/03/2019 19:12       Larya Charpentier Ginette Otto MD [facility]  If 7PM-7AM, please contact night-coverage   02/03/2019, 7:40 PM

## 2019-02-03 NOTE — ED Provider Notes (Signed)
[facility] EMERGENCY DEPARTMENT Provider Note   CSN: [id number] Arrival date & time: 02/03/19  [id number]     History Chief Complaint  Patient presents with  . Fall    [name] is a 85 y.o. female.  Patient lives by herself and has dementia but her daughter helps take care of her.  She came by and saw her and found her on the ground today.  Patient was awake and alert but confused like normal  The history is provided by the patient and a relative. No language interpreter was used.  Fall This is a new problem. The current episode started 6 to 12 hours ago. The problem occurs rarely. The problem has not changed since onset.Pertinent negatives include no chest pain, no abdominal pain and no headaches. Nothing aggravates the symptoms. Nothing relieves the symptoms. She has tried nothing for the symptoms. The treatment provided no relief.       Past Medical History:  Diagnosis Date  . Double vision    started 1st part of December 2013-Has  a Cerebral Aneurysm  . High cholesterol   . Hyperlipidemia   . Kidney stone     Patient Active Problem List   Diagnosis Date Noted  . Cerebral aneurysm without rupture 01/23/2012    Class: Acute  . Distal radius fracture, left 09/05/2011    Past Surgical History:  Procedure Laterality Date  . BACK SURGERY    . CRANIOTOMY  01/23/2012   Procedure: CRANIOTOMY INTRACRANIAL ANEURYSM FOR CAROTID;  Surgeon: Winfield Cunas, MD;  Location: [facility];  Service: Neurosurgery;  Laterality: Left;  Left Pterional Craniotomy for aneurysm clipping  . Left wrist     fracture 02/2011  . TUBAL LIGATION       OB History   No obstetric history on file.     Family History  Family history unknown: Yes    Social History   Tobacco Use  . Smoking status: Never Smoker  . Smokeless tobacco: Never Used  . Tobacco comment: quit over 20 years ago.  Substance Use Topics  . Alcohol use: No  . Drug use: No    Home Medications Prior to Admission  medications   Medication Sig Start Date End Date Taking? Authorizing Provider  HYDROcodone-acetaminophen (NORCO/VICODIN) 5-325 MG tablet Take 1 tablet by mouth every 6 (six) hours as needed. Patient not taking: Reported on 09/06/2018 07/21/18   Milton Ferguson, MD  levETIRAcetam (KEPPRA) 500 MG tablet Take 1 tablet (500 mg total) by mouth every 12 (twelve) hours. Patient not taking: Reported on 07/21/2018 01/28/12   Ashok Pall, MD    Allergies    Patient has no known allergies.  Review of Systems   Review of Systems  Unable to perform ROS: Dementia  Constitutional: Negative for appetite change and fatigue.  HENT: Negative for congestion, ear discharge and sinus pressure.   Eyes: Negative for discharge.  Respiratory: Negative for cough.   Cardiovascular: Negative for chest pain.  Gastrointestinal: Negative for abdominal pain and diarrhea.  Genitourinary: Negative for frequency and hematuria.  Musculoskeletal: Negative for back pain.  Skin: Negative for rash.  Neurological: Negative for seizures and headaches.  Psychiatric/Behavioral: Negative for hallucinations.    Physical Exam Updated Vital Signs BP (!) 132/94 (BP Location: Left Arm)   Pulse 82   Temp 98.9 F (37.2 C) (Oral)   Resp 18   Ht 5\' 6"  (1.676 m)   Wt 41.7 kg   SpO2 100%   BMI 14.85 kg/m  Physical Exam Vitals and nursing note reviewed.  Constitutional:      Appearance: She is well-developed.  HENT:     Head: Normocephalic.     Nose: Nose normal.  Eyes:     General: No scleral icterus.    Conjunctiva/sclera: Conjunctivae normal.  Neck:     Thyroid: No thyromegaly.  Cardiovascular:     Rate and Rhythm: Normal rate and regular rhythm.     Heart sounds: No murmur. No friction rub. No gallop.   Pulmonary:     Breath sounds: No stridor. No wheezing or rales.  Chest:     Chest wall: No tenderness.  Abdominal:     General: There is no distension.     Tenderness: There is no abdominal tenderness. There is  no rebound.  Musculoskeletal:     Cervical back: Neck supple.     Comments: Tender swollen right elbow  Lymphadenopathy:     Cervical: No cervical adenopathy.  Skin:    Findings: No erythema or rash.  Neurological:     Motor: No abnormal muscle tone.     Coordination: Coordination normal.     Comments: Oriented to person only but alert     ED Results / Procedures / Treatments   Labs (all labs ordered are listed, but only abnormal results are displayed) Labs Reviewed  COMPREHENSIVE METABOLIC PANEL - Abnormal; Notable for the following components:      Result Value   Glucose, Bld 116 (*)    All other components within normal limits  URINALYSIS, ROUTINE W REFLEX MICROSCOPIC - Abnormal; Notable for the following components:   APPearance HAZY (*)    All other components within normal limits  CBC WITH DIFFERENTIAL/PLATELET  CK  TROPONIN I (HIGH SENSITIVITY)  TROPONIN I (HIGH SENSITIVITY)    EKG None  Radiology DG Chest 2 View  Result Date: 02/03/2019 CLINICAL DATA:  Fall, confusion at baseline EXAM: CHEST - 2 VIEW COMPARISON:  In a 01/23/2012, 01/27/2008 FINDINGS: Normal heart size, mediastinal contours, and pulmonary vascularity. Atherosclerotic calcification aorta. Emphysematous and bronchitic changes consistent with COPD. Interstitial prominence, chronic. No infiltrate, pleural effusion or pneumothorax. Bones demineralized with mark compression deformity of a lower thoracic vertebra, age indeterminate but new since 2010. IMPRESSION: COPD changes without acute infiltrate. Age-indeterminate compression fracture of a lower thoracic vertebra. Electronically Signed   By: Lavonia Dana M.D.   On: 02/03/2019 10:59   DG Pelvis 1-2 Views  Result Date: 02/03/2019 CLINICAL DATA:  Fall, baseline confusion, pain in pelvis when moving for imaging EXAM: PELVIS - 1-2 VIEW COMPARISON:  None FINDINGS: Osseous demineralization. RIGHT SI joint poorly profiled. At LEFT SI joint and hip joint spaces  preserved. No definite fracture, dislocation, or bone destruction. IMPRESSION: No definite acute osseous abnormalities. Electronically Signed   By: Lavonia Dana M.D.   On: 02/03/2019 11:00   DG Elbow Complete Right  Result Date: 02/03/2019 CLINICAL DATA:  Unwitnessed fall, swelling and bruising RIGHT elbow EXAM: RIGHT ELBOW - COMPLETE 3+ VIEW COMPARISON:  None FINDINGS: Osseous demineralization. Soft tissue swelling overlying the elbow and proximal forearm. Intra-articular olecranon fracture, proximal fragment distracted 2.3 cm. Humerus and radius intact. IMPRESSION: Distracted intra-articular olecranon fracture with significant overlying soft tissue swelling. Electronically Signed   By: Lavonia Dana M.D.   On: 02/03/2019 10:57   CT Head Wo Contrast  Result Date: 02/03/2019 CLINICAL DATA:  Trauma to the head and neck. EXAM: CT HEAD WITHOUT CONTRAST CT CERVICAL SPINE WITHOUT CONTRAST TECHNIQUE: Multidetector CT imaging  of the head and cervical spine was performed following the standard protocol without intravenous contrast. Multiplanar CT image reconstructions of the cervical spine were also generated. COMPARISON:  01/27/2012 FINDINGS: CT HEAD FINDINGS Brain: The brain does not show accelerated generalized atrophy. No focal finding affects the brainstem or cerebellum. 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 Mild chronic small-vessel change of the hemispheric white matter. No hydrocephalus, hemorrhage or extra-axial collection. Vascular: There is atherosclerotic calcification of the major vessels at the base of the brain. Skull: Left pterional craniotomy for previous aneurysm clipping. Otherwise negative. Sinuses/Orbits: Clear/normal Other: None CT CERVICAL SPINE FINDINGS Alignment: Normal except for 2 mm of degenerative anterolisthesis at C4-5. Skull base and vertebrae: No skull base fracture. No cervical spine fracture. Compression  fracture at the superior endplate of T1 with loss of height of 10% or less. This is age indeterminate but could be recent. Soft tissues and spinal canal: Negative Disc levels: Chronic spondylosis at C5-6. Mild bilateral bony foraminal narrowing. Chronic facet osteoarthritis on the left at C4-5. Left bony foraminal encroachment because of this. Upper chest: Negative Other: None IMPRESSION: Head CT: No acute or traumatic finding. Previous left pterional craniotomy for aneurysm clipping. Cervical spine CT: No cervical spine fracture. Minimal superior endplate compression fracture at T1 with loss of height of 10%. This is age indeterminate but could possibly be recent. Degenerative spondylosis at C5-6. Degenerative facet arthritis on the left at C4-5. Electronically Signed   By: Nelson Chimes M.D.   On: 02/03/2019 11:13   CT Cervical Spine Wo Contrast  Result Date: 02/03/2019 CLINICAL DATA:  Trauma to the head and neck. EXAM: CT HEAD WITHOUT CONTRAST CT CERVICAL SPINE WITHOUT CONTRAST TECHNIQUE: Multidetector CT imaging of the head and cervical spine was performed following the standard protocol without intravenous contrast. Multiplanar CT image reconstructions of the cervical spine were also generated. COMPARISON:  01/27/2012 FINDINGS: CT HEAD FINDINGS Brain: The brain does not show accelerated generalized atrophy. No focal finding affects the brainstem or cerebellum. 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 Mild chronic small-vessel change of the hemispheric white matter. No hydrocephalus, hemorrhage or extra-axial collection. Vascular: There is atherosclerotic calcification of the major vessels at the base of the brain. Skull: Left pterional craniotomy for previous aneurysm clipping. Otherwise negative. Sinuses/Orbits: Clear/normal Other: None CT CERVICAL SPINE FINDINGS Alignment: Normal except for 2 mm of degenerative anterolisthesis  at C4-5. Skull base and vertebrae: No skull base fracture. No cervical spine fracture. Compression fracture at the superior endplate of T1 with loss of height of 10% or less. This is age indeterminate but could be recent. Soft tissues and spinal canal: Negative Disc levels: Chronic spondylosis at C5-6. Mild bilateral bony foraminal narrowing. Chronic facet osteoarthritis on the left at C4-5. Left bony foraminal encroachment because of this. Upper chest: Negative Other: None IMPRESSION: Head CT: No acute or traumatic finding. Previous left pterional craniotomy for aneurysm clipping. Cervical spine CT: No cervical spine fracture. Minimal superior endplate compression fracture at T1 with loss of height of 10%. This is age indeterminate but could possibly be recent. Degenerative spondylosis at C5-6. Degenerative facet arthritis on the left at C4-5. Electronically Signed   By: Nelson Chimes M.D.   On: 02/03/2019 11:13    Procedures Procedures (including critical care time)  Medications Ordered in ED Medications  sodium chloride  0.9 % bolus 500 mL (500 mLs Intravenous New Bag/Given 02/03/19 1152)    ED Course  I have reviewed the triage vital signs and the nursing notes.  Pertinent labs & imaging results that were available during my care of the patient were reviewed by me and considered in my medical decision making (see chart for details). Labs and x-rays unremarkable except for her right elbow is broken.  Patient was put in a posterior splint and will follow up with orthopedics.  Her daughter is notified and will help take care of her   MDM Rules/Calculators/A&P                      linical Impression(s) / ED Diagnoses Final diagnoses:  None    Rx / DC Orders ED Discharge Orders    None       Milton Ferguson, MD 02/03/19 1320

## 2019-02-03 NOTE — ED Notes (Signed)
Family updated as to patient's status.

## 2019-02-04 ENCOUNTER — Encounter (HOSPITAL_COMMUNITY): Payer: Self-pay | Admitting: Internal Medicine

## 2019-02-04 ENCOUNTER — Inpatient Hospital Stay (HOSPITAL_COMMUNITY): Payer: MEDICARE | Admitting: Anesthesiology

## 2019-02-04 ENCOUNTER — Inpatient Hospital Stay (HOSPITAL_COMMUNITY): Payer: MEDICARE

## 2019-02-04 ENCOUNTER — Telehealth: Payer: Self-pay | Admitting: Orthopedic Surgery

## 2019-02-04 ENCOUNTER — Encounter (HOSPITAL_COMMUNITY)
Admission: EM | Disposition: A | Discharge status: Skilled Nursing Facility | Payer: Self-pay | Source: Home / Self Care | Attending: Internal Medicine

## 2019-02-04 DIAGNOSIS — S72001A Fracture of unspecified part of neck of right femur, initial encounter for closed fracture: Secondary | ICD-10-CM

## 2019-02-04 DIAGNOSIS — F0390 Unspecified dementia without behavioral disturbance: Secondary | ICD-10-CM

## 2019-02-04 HISTORY — PX: INTRAMEDULLARY (IM) NAIL INTERTROCHANTERIC: SHX5875

## 2019-02-04 LAB — BASIC METABOLIC PANEL
Anion gap: 8 (ref 5–15)
BUN: 19 mg/dL (ref 8–23)
CO2: 27 mmol/L (ref 22–32)
Calcium: 8.8 mg/dL — ABNORMAL LOW (ref 8.9–10.3)
Chloride: 102 mmol/L (ref 98–111)
Creatinine, Ser: 0.66 mg/dL (ref 0.44–1.00)
GFR calc Af Amer: >60 mL/min (ref >60)
GFR calc non Af Amer: >60 mL/min (ref >60)
Glucose, Bld: 114 mg/dL — ABNORMAL HIGH (ref 70–99)
Potassium: 4.4 mmol/L (ref 3.5–5.1)
Sodium: 137 mmol/L (ref 135–145)

## 2019-02-04 LAB — CBC
HCT: 34 % — ABNORMAL LOW (ref 36.0–46.0)
HCT: 24.4 % — ABNORMAL LOW (ref 36.0–46.0)
Hemoglobin: 10.8 g/dL — ABNORMAL LOW (ref 12.0–15.0)
Hemoglobin: 8 g/dL — ABNORMAL LOW (ref 12.0–15.0)
MCH: 31.3 pg (ref 26.0–34.0)
MCH: 31.7 pg (ref 26.0–34.0)
MCHC: 31.8 g/dL (ref 30.0–36.0)
MCHC: 32.8 g/dL (ref 30.0–36.0)
MCV: 98.6 fL (ref 80.0–100.0)
MCV: 96.8 fL (ref 80.0–100.0)
Platelets: 282 10*3/uL (ref 150–400)
Platelets: 249 10*3/uL (ref 150–400)
RBC: 3.45 MIL/uL — ABNORMAL LOW (ref 3.87–5.11)
RBC: 2.52 MIL/uL — ABNORMAL LOW (ref 3.87–5.11)
RDW: 12.5 % (ref 11.5–15.5)
RDW: 12.6 % (ref 11.5–15.5)
WBC: 8.9 10*3/uL (ref 4.0–10.5)
WBC: 10.5 10*3/uL (ref 4.0–10.5)
nRBC: 0 % (ref 0.0–0.2)
nRBC: 0 % (ref 0.0–0.2)

## 2019-02-04 LAB — SURGICAL PCR SCREEN
MRSA, PCR: NEGATIVE
Staphylococcus aureus: NEGATIVE

## 2019-02-04 LAB — CREATININE, SERUM
Creatinine, Ser: 0.88 mg/dL (ref 0.44–1.00)
GFR calc Af Amer: >60 mL/min (ref >60)
GFR calc non Af Amer: >60 mL/min (ref >60)

## 2019-02-04 SURGERY — FIXATION, FRACTURE, INTERTROCHANTERIC, WITH INTRAMEDULLARY ROD
Anesthesia: Spinal | Site: Hip | Laterality: Right

## 2019-02-04 MED ORDER — FENTANYL CITRATE (PF) 100 MCG/2ML IJ SOLN: 25.0000 ug | INTRAMUSCULAR | Status: DC | PRN

## 2019-02-04 MED ORDER — PHENYLEPHRINE HCL-NACL 10-0.9 MG/250ML-% IV SOLN
INTRAVENOUS | Status: DC | PRN
  Administered 2019-02-04: 25 ug/min via INTRAVENOUS

## 2019-02-04 MED ORDER — ACETAMINOPHEN 500 MG PO TABS: 1000.0000 mg | ORAL_TABLET | Freq: Three times a day (TID) | ORAL | Status: DC

## 2019-02-04 MED ORDER — OXYCODONE HCL 5 MG PO TABS
5.0000 mg | ORAL_TABLET | ORAL | Status: DC | PRN
  Administered 2019-02-06: 5 mg via ORAL
  Filled 2019-02-04: qty 1

## 2019-02-04 MED ORDER — LACTATED RINGERS IV SOLN: INTRAVENOUS | Status: DC | PRN

## 2019-02-04 MED ORDER — BUPIVACAINE IN DEXTROSE 0.75-8.25 % IT SOLN
INTRATHECAL | Status: DC | PRN
  Administered 2019-02-04: 1.4 mg via INTRATHECAL

## 2019-02-04 MED ORDER — CEFAZOLIN SODIUM-DEXTROSE 2-3 GM-%(50ML) IV SOLR
INTRAVENOUS | Status: DC | PRN
  Administered 2019-02-04: 2 g via INTRAVENOUS

## 2019-02-04 MED ORDER — ACETAMINOPHEN 500 MG PO TABS
1000.0000 mg | ORAL_TABLET | Freq: Once | ORAL | Status: DC
  Filled 2019-02-04: qty 2

## 2019-02-04 MED ORDER — CELECOXIB 200 MG PO CAPS
200.0000 mg | ORAL_CAPSULE | Freq: Once | ORAL | Status: DC
  Filled 2019-02-04: qty 1

## 2019-02-04 MED ORDER — ONDANSETRON HCL 4 MG/2ML IJ SOLN: 4.0000 mg | Freq: Four times a day (QID) | INTRAMUSCULAR | Status: DC | PRN

## 2019-02-04 MED ORDER — POVIDONE-IODINE 10 % EX SWAB: 2.0000 "application " | Freq: Once | CUTANEOUS | Status: DC

## 2019-02-04 MED ORDER — PHENOL 1.4 % MT LIQD: 1.0000 | OROMUCOSAL | Status: DC | PRN

## 2019-02-04 MED ORDER — CHLORHEXIDINE GLUCONATE CLOTH 2 % EX PADS
6.0000 | MEDICATED_PAD | Freq: Every day | CUTANEOUS | Status: DC
  Administered 2019-02-04 – 2019-02-08 (×5): 6 via TOPICAL

## 2019-02-04 MED ORDER — PROPOFOL 500 MG/50ML IV EMUL: INTRAVENOUS | Status: DC | PRN

## 2019-02-04 MED ORDER — PHENYLEPHRINE HCL (PRESSORS) 10 MG/ML IV SOLN
INTRAVENOUS | Status: DC | PRN
  Administered 2019-02-04 (×2): 100 ug via INTRAVENOUS

## 2019-02-04 MED ORDER — 0.9 % SODIUM CHLORIDE (POUR BTL) OPTIME
TOPICAL | Status: DC | PRN
  Administered 2019-02-04: 1000 mL

## 2019-02-04 MED ORDER — PROPOFOL 10 MG/ML IV BOLUS
INTRAVENOUS | Status: DC | PRN
  Administered 2019-02-04: 10 mg via INTRAVENOUS
  Administered 2019-02-04: 30 mg via INTRAVENOUS

## 2019-02-04 MED ORDER — MUPIROCIN 2 % EX OINT
1.0000 "application " | TOPICAL_OINTMENT | Freq: Two times a day (BID) | CUTANEOUS | Status: AC
  Administered 2019-02-04 – 2019-02-08 (×9): 1 via NASAL
  Filled 2019-02-04 (×3): qty 22

## 2019-02-04 MED ORDER — CHLORHEXIDINE GLUCONATE 4 % EX LIQD
60.0000 mL | Freq: Once | CUTANEOUS | Status: DC
  Filled 2019-02-04: qty 60

## 2019-02-04 MED ORDER — PROPOFOL 500 MG/50ML IV EMUL
INTRAVENOUS | Status: DC | PRN
  Administered 2019-02-04: 40 ug/kg/min via INTRAVENOUS

## 2019-02-04 MED ORDER — ONDANSETRON HCL 4 MG PO TABS: 4.0000 mg | ORAL_TABLET | Freq: Four times a day (QID) | ORAL | Status: DC | PRN

## 2019-02-04 MED ORDER — PROMETHAZINE HCL 25 MG/ML IJ SOLN: 6.2500 mg | INTRAMUSCULAR | Status: DC | PRN

## 2019-02-04 MED ORDER — LEVETIRACETAM IN NACL 500 MG/100ML IV SOLN
500.0000 mg | Freq: Two times a day (BID) | INTRAVENOUS | Status: DC
  Administered 2019-02-04 – 2019-02-05 (×4): 500 mg via INTRAVENOUS
  Filled 2019-02-04 (×4): qty 100

## 2019-02-04 MED ORDER — ASCORBIC ACID 500 MG PO TABS
500.0000 mg | ORAL_TABLET | Freq: Two times a day (BID) | ORAL | Status: DC
  Administered 2019-02-05 – 2019-02-14 (×15): 500 mg via ORAL
  Filled 2019-02-04 (×16): qty 1

## 2019-02-04 MED ORDER — ENOXAPARIN SODIUM 40 MG/0.4ML ~~LOC~~ SOLN
40.0000 mg | SUBCUTANEOUS | Status: DC
  Administered 2019-02-05 – 2019-02-08 (×4): 40 mg via SUBCUTANEOUS
  Filled 2019-02-04 (×5): qty 0.4

## 2019-02-04 MED ORDER — ALBUMIN HUMAN 5 % IV SOLN: INTRAVENOUS | Status: DC | PRN

## 2019-02-04 MED ORDER — ACETAMINOPHEN 650 MG RE SUPP
650.0000 mg | Freq: Three times a day (TID) | RECTAL | Status: DC
  Administered 2019-02-04 – 2019-02-05 (×2): 650 mg via RECTAL
  Filled 2019-02-04 (×2): qty 1

## 2019-02-04 MED ORDER — HYDROMORPHONE HCL 1 MG/ML IJ SOLN
0.5000 mg | INTRAMUSCULAR | Status: DC | PRN
  Administered 2019-02-04 – 2019-02-12 (×11): 0.5 mg via INTRAVENOUS
  Filled 2019-02-04 (×11): qty 0.5

## 2019-02-04 MED ORDER — CEFAZOLIN SODIUM-DEXTROSE 2-4 GM/100ML-% IV SOLN
2.0000 g | Freq: Three times a day (TID) | INTRAVENOUS | Status: AC
  Administered 2019-02-04 – 2019-02-05 (×2): 2 g via INTRAVENOUS
  Filled 2019-02-04 (×3): qty 100

## 2019-02-04 MED ORDER — MENTHOL 3 MG MT LOZG: 1.0000 | LOZENGE | OROMUCOSAL | Status: DC | PRN

## 2019-02-04 MED ORDER — CEFAZOLIN SODIUM-DEXTROSE 2-4 GM/100ML-% IV SOLN
2.0000 g | INTRAVENOUS | Status: AC
  Administered 2019-02-09: 2 g via INTRAVENOUS
  Filled 2019-02-04: qty 100

## 2019-02-04 MED ORDER — DOCUSATE SODIUM 100 MG PO CAPS
100.0000 mg | ORAL_CAPSULE | Freq: Two times a day (BID) | ORAL | Status: DC
  Administered 2019-02-05: 100 mg via ORAL
  Filled 2019-02-04: qty 1

## 2019-02-04 SURGICAL SUPPLY — 42 items
BIT DRILL 4.3MMS DISTAL GRDTED (BIT) IMPLANT
BNDG COHESIVE 4X5 TAN STRL (GAUZE/BANDAGES/DRESSINGS) ×3 IMPLANT
BNDG GAUZE ELAST 4 BULKY (GAUZE/BANDAGES/DRESSINGS) ×3 IMPLANT
CLOSURE STERI-STRIP 1/2X4 (GAUZE/BANDAGES/DRESSINGS) ×1
CLSR STERI-STRIP ANTIMIC 1/2X4 (GAUZE/BANDAGES/DRESSINGS) ×2 IMPLANT
COVER PERINEAL POST (MISCELLANEOUS) ×3 IMPLANT
COVER SURGICAL LIGHT HANDLE (MISCELLANEOUS) ×3 IMPLANT
COVER WAND RF STERILE (DRAPES) ×3 IMPLANT
DRAPE STERI IOBAN 125X83 (DRAPES) ×3 IMPLANT
DRILL 4.3MMS DISTAL GRADUATED (BIT) ×3
DRSG AQUACEL AG ADV 3.5X 4 (GAUZE/BANDAGES/DRESSINGS) ×6 IMPLANT
DRSG AQUACEL AG ADV 3.5X 6 (GAUZE/BANDAGES/DRESSINGS) ×3 IMPLANT
DURAPREP 26ML APPLICATOR (WOUND CARE) ×3 IMPLANT
ELECT REM PT RETURN 9FT ADLT (ELECTROSURGICAL) ×3
ELECTRODE REM PT RTRN 9FT ADLT (ELECTROSURGICAL) ×1 IMPLANT
GLOVE BIOGEL PI IND STRL 8 (GLOVE) ×1 IMPLANT
GLOVE BIOGEL PI INDICATOR 8 (GLOVE) ×2
GLOVE BIOGEL PI ORTHO PRO SZ8 (GLOVE)
GLOVE ECLIPSE 8.0 STRL XLNG CF (GLOVE) ×6 IMPLANT
GLOVE PI ORTHO PRO STRL SZ8 (GLOVE) IMPLANT
GLOVE SURG ORTHO 8.0 STRL STRW (GLOVE) IMPLANT
GOWN STRL REUS W/ TWL LRG LVL3 (GOWN DISPOSABLE) ×2 IMPLANT
GOWN STRL REUS W/TWL 2XL LVL3 (GOWN DISPOSABLE) IMPLANT
GOWN STRL REUS W/TWL LRG LVL3 (GOWN DISPOSABLE) ×6
GUIDEPIN THREADED 3.2MM X 17 ×2 IMPLANT
GUIDEWIRE BALL NOSE 100CM (WIRE) ×2 IMPLANT
HFN RH 130 DEG 9MM X 340MM (Nail) ×2 IMPLANT
KIT TURNOVER KIT B (KITS) ×3 IMPLANT
MANIFOLD NEPTUNE II (INSTRUMENTS) ×3 IMPLANT
NS IRRIG 1000ML POUR BTL (IV SOLUTION) ×3 IMPLANT
PACK GENERAL/GYN (CUSTOM PROCEDURE TRAY) ×3 IMPLANT
PAD ARMBOARD 7.5X6 YLW CONV (MISCELLANEOUS) ×3 IMPLANT
SCREW BONE CORTICAL 5.0X40 (Screw) ×2 IMPLANT
SCREW BONE CORTICAL 5.0X42 (Screw) ×2 IMPLANT
SCREW LAG HIP NAIL 10.5X95 (Screw) ×2 IMPLANT
SUT MNCRL AB 4-0 PS2 18 (SUTURE) ×5 IMPLANT
SUT VIC AB 0 CT1 27 (SUTURE) ×3
SUT VIC AB 0 CT1 27XBRD ANBCTR (SUTURE) ×1 IMPLANT
SUT VIC AB 2-0 CT1 27 (SUTURE) ×3
SUT VIC AB 2-0 CT1 TAPERPNT 27 (SUTURE) ×1 IMPLANT
TOWEL GREEN STERILE (TOWEL DISPOSABLE) ×3 IMPLANT
TOWEL GREEN STERILE FF (TOWEL DISPOSABLE) ×3 IMPLANT

## 2019-02-04 NOTE — Transfer of Care (Signed)
Immediate Anesthesia Transfer of Care Note  Patient: [name]  Procedure(s) Performed: INTRAMEDULLARY (IM) NAIL INTERTROCHANTRIC (Right Hip)  Patient Location: OR 3  Anesthesia Type:General  Level of Consciousness: awake, drowsy and patient cooperative  Airway & Oxygen Therapy: Patient Spontanous Breathing  Post-op Assessment: Report given to RN and Post -op Vital signs reviewed and stable  Post vital signs: Reviewed and stable  Last Vitals:  Vitals Value Taken Time  BP 97/67 02/04/19 1715  Temp 36.1 C 02/04/19 1715  Pulse 111 02/04/19 1715  Resp 20 02/04/19 1715  SpO2 96 % 02/04/19 1715    Last Pain:  Vitals:   02/04/19 1318  TempSrc: Oral  PainSc:          Complications: No apparent anesthesia complications   BP 99991111 systolic.  Albumin started.  MDA updated.  Report to PACU nurse "Heather" at bedside.  Patient denies pain or discomforts.  Oriented to person and DOB.

## 2019-02-04 NOTE — Telephone Encounter (Signed)
Call received from patient's daughter and designated party, Harlen [employer], relaying that patient had another fall last night; was taken to [facility]; said Xrays showed fracture of right hip; said patient was next transferred to [facility] where she is awaiting surgery. Dr Aline Brochure may already be aware.  States she will need to cancel the appointment she had scheduled this Wednesday, 02/08/18, which was for wrist fracture. Aware that all will be addressed at [place].

## 2019-02-04 NOTE — ED Notes (Signed)
Called [facility] called to get eta on arrival to transport patient to surgery. [facility] stated that they only had two trucks, would put patient on list. Probably will transport before 6 am or after 7 am upon availability of transportation.

## 2019-02-04 NOTE — Anesthesia Procedure Notes (Signed)
Spinal  Patient location during procedure: OR Start time: 02/04/2019 2:08 PM End time: 02/04/2019 3:08 PM Staffing Performed: anesthesiologist  Anesthesiologist: Duane Boston, MD Preanesthetic Checklist Completed: patient identified, IV checked, risks and benefits discussed, surgical consent, monitors and equipment checked, pre-op evaluation and timeout performed Spinal Block Patient position: sitting Prep: DuraPrep Patient monitoring: cardiac monitor, continuous pulse ox and blood pressure Approach: midline Location: L2-3 Injection technique: single-shot Needle Needle type: Pencan and Quincke  Needle gauge: 22 G Needle length: 9 cm Additional Notes Functioning IV was confirmed and monitors were applied. Sterile prep and drape, including hand hygiene and sterile gloves were used. The patient was positioned and the spine was prepped. The skin was anesthetized with lidocaine.  Free flow of clear CSF was obtained prior to injecting local anesthetic into the CSF.  The spinal needle aspirated freely following injection.  The needle was carefully withdrawn.  The patient tolerated the procedure well.

## 2019-02-04 NOTE — Anesthesia Preprocedure Evaluation (Addendum)
Anesthesia Evaluation  Patient identified by MRN, date of birth, ID band Patient confused    Reviewed: Allergy & Precautions, NPO status , Patient's Chart, lab work & pertinent test results  History of Anesthesia Complications Negative for: history of anesthetic complications  Airway Mallampati: III  TM Distance: >3 FB     Dental  (+) Edentulous Upper   Pulmonary  Covid positive   Pulmonary exam normal        Cardiovascular negative cardio ROS Normal cardiovascular exam     Neuro/Psych PSYCHIATRIC DISORDERS Dementia negative neurological ROS     GI/Hepatic negative GI ROS, Neg liver ROS,   Endo/Other  negative endocrine ROS  Renal/GU negative Renal ROS     Musculoskeletal negative musculoskeletal ROS (+)   Abdominal   Peds  Hematology negative hematology ROS (+) anemia ,   Anesthesia Other Findings Day of surgery medications reviewed with the patient.  Reproductive/Obstetrics                           Anesthesia Physical Anesthesia Plan  ASA: III  Anesthesia Plan: Spinal   Post-op Pain Management:    Induction:   PONV Risk Score and Plan: 2 and Ondansetron and Propofol infusion  Airway Management Planned: Natural Airway  Additional Equipment:   Intra-op Plan:   Post-operative Plan:   Informed Consent: I have reviewed the patients History and Physical, chart, labs and discussed the procedure including the risks, benefits and alternatives for the proposed anesthesia with the patient or authorized representative who has indicated his/her understanding and acceptance.     Dental advisory given and Consent reviewed with POA  Plan Discussed with: CRNA and Anesthesiologist  Anesthesia Plan Comments:        Anesthesia Quick Evaluation

## 2019-02-04 NOTE — Progress Notes (Signed)
Pt arrived to unit

## 2019-02-04 NOTE — Consult Note (Signed)
Reason for Consult:Hip fx Referring Physician: V Renai [name] is an 85 y.o. female.  HPI: [name] fell at home. She is confused here and cannot contribute meaningfully to history. She was found to have a right femur and right olecranon fx and was transferred to [facility] for further care.  Past Medical History:  Diagnosis Date  . Double vision    started 1st part of December 2013-Has  a Cerebral Aneurysm  . High cholesterol   . Hyperlipidemia   . Kidney stone     Past Surgical History:  Procedure Laterality Date  . BACK SURGERY    . CRANIOTOMY  01/23/2012   Procedure: CRANIOTOMY INTRACRANIAL ANEURYSM FOR CAROTID;  Surgeon: Kathleen Cunas, MD;  Location: [facility];  Service: Neurosurgery;  Laterality: Left;  Left Pterional Craniotomy for aneurysm clipping  . Left wrist     fracture 02/2011  . TUBAL LIGATION      Family History  Family history unknown: Yes    Social History:  reports that she has never smoked. She has never used smokeless tobacco. She reports that she does not drink alcohol or use drugs.  Allergies: No Known Allergies  Medications: I have reviewed the patient's current medications.  Results for orders placed or performed during the hospital encounter of 02/03/19 (from the past 48 hour(s))  CBC     Status: Abnormal   Collection Time: 02/03/19  6:31 PM  Result Value Ref Range   WBC 9.5 4.0 - 10.5 K/uL   RBC 3.86 (L) 3.87 - 5.11 MIL/uL   Hemoglobin 12.3 12.0 - 15.0 g/dL   HCT 38.6 36.0 - 46.0 %   MCV 100.0 80.0 - 100.0 fL   MCH 31.9 26.0 - 34.0 pg   MCHC 31.9 30.0 - 36.0 g/dL   RDW 12.6 11.5 - 15.5 %   Platelets 316 150 - 400 K/uL   nRBC 0.0 0.0 - 0.2 %    Comment: Performed at [facility], [address], [place], [place] [id number]  Basic metabolic panel     Status: Abnormal   Collection Time: 02/03/19  6:31 PM  Result Value Ref Range   Sodium 136 135 - 145 mmol/L   Potassium 4.0 3.5 - 5.1 mmol/L   Chloride 101 98 - 111 mmol/L   CO2 26  22 - 32 mmol/L   Glucose, Bld 129 (H) 70 - 99 mg/dL   BUN 18 8 - 23 mg/dL   Creatinine, Ser 0.76 0.44 - 1.00 mg/dL   Calcium 9.0 8.9 - 10.3 mg/dL   GFR calc non Af Amer >60 >60 mL/min   GFR calc Af Amer >60 >60 mL/min   Anion gap 9 5 - 15    Comment: Performed at [facility], [address] [place], [place] [address]  Respiratory Panel by RT PCR (Flu A&B, Covid) - Nasopharyngeal Swab     Status: Abnormal   Collection Time: 02/03/19  7:19 PM   Specimen: Nasopharyngeal Swab  Result Value Ref Range   SARS Coronavirus 2 by RT PCR POSITIVE (A) NEGATIVE    Comment: RESULT CALLED TO, READ BACK BY AND VERIFIED WITH: T WALKER,RN @2058  02/03/19 MKELLY (NOTE) SARS-CoV-2 target nucleic acids are DETECTED. SARS-CoV-2 RNA is generally detectable in upper respiratory specimens  during the acute phase of infection. Positive results are indicative of the presence of the identified virus, but do not rule out bacterial infection or co-infection with other pathogens not detected by the test. Clinical correlation  with patient history and other diagnostic information is necessary to determine patient infection status. The expected result is Negative. Fact Sheet for Patients:  PinkCheek.be Fact Sheet for Healthcare Providers: GravelBags.it This test is not yet approved or cleared by the [place] FDA and  has been authorized for detection and/or diagnosis of SARS-CoV-2 by FDA under an Emergency Use Authorization (EUA).  This EUA will remain in effect (meaning this test can be used) for  the duration of  the COVID-19 declaration under Section 564(b)(1) of the Act, 21 U.S.C. section 360bbb-3(b)(1), unless the authorization is terminated or revoked sooner.    Influenza A by PCR NEGATIVE NEGATIVE   Influenza B by PCR NEGATIVE NEGATIVE    Comment: (NOTE) The Xpert Xpress SARS-CoV-2/FLU/RSV assay is intended as an aid in  the diagnosis of  influenza from Nasopharyngeal swab specimens and  should not be used as a sole basis for treatment. Nasal washings and  aspirates are unacceptable for Xpert Xpress SARS-CoV-2/FLU/RSV  testing. Fact Sheet for Patients: PinkCheek.be Fact Sheet for Healthcare Providers: GravelBags.it This test is not yet approved or cleared by the [place] FDA and  has been authorized for detection and/or diagnosis of SARS-CoV-2 by  FDA under an Emergency Use Authorization (EUA). This EUA will remain  in effect (meaning this test can be used) for the duration of the  Covid-19 declaration under Section 564(b)(1) of the Act, 21  U.S.C. section 360bbb-3(b)(1), unless the authorization is  terminated or revoked. Performed at [facility], [address] [place], [place] [address]   CBC     Status: Abnormal   Collection Time: 02/04/19  4:21 AM  Result Value Ref Range   WBC 8.9 4.0 - 10.5 K/uL   RBC 3.45 (L) 3.87 - 5.11 MIL/uL   Hemoglobin 10.8 (L) 12.0 - 15.0 g/dL   HCT 34.0 (L) 36.0 - 46.0 %   MCV 98.6 80.0 - 100.0 fL   MCH 31.3 26.0 - 34.0 pg   MCHC 31.8 30.0 - 36.0 g/dL   RDW 12.5 11.5 - 15.5 %   Platelets 282 150 - 400 K/uL   nRBC 0.0 0.0 - 0.2 %    Comment: Performed at [facility], [address] [place], [place] [id number]  Basic metabolic panel     Status: Abnormal   Collection Time: 02/04/19  4:21 AM  Result Value Ref Range   Sodium 137 135 - 145 mmol/L   Potassium 4.4 3.5 - 5.1 mmol/L   Chloride 102 98 - 111 mmol/L   CO2 27 22 - 32 mmol/L   Glucose, Bld 114 (H) 70 - 99 mg/dL   BUN 19 8 - 23 mg/dL   Creatinine, Ser 0.66 0.44 - 1.00 mg/dL   Calcium 8.8 (L) 8.9 - 10.3 mg/dL   GFR calc non Af Amer >60 >60 mL/min   GFR calc Af Amer >60 >60 mL/min   Anion gap 8 5 - 15    Comment: Performed at [facility], [address], [place], [place] [address]    DG Chest 2 View  Result Date: 02/03/2019 CLINICAL DATA:  Fall, confusion at  baseline EXAM: CHEST - 2 VIEW COMPARISON:  In a 01/23/2012, 01/27/2008 FINDINGS: Normal heart size, mediastinal contours, and pulmonary vascularity. Atherosclerotic calcification aorta. Emphysematous and bronchitic changes consistent with COPD. Interstitial prominence, chronic. No infiltrate, pleural effusion or pneumothorax. Bones demineralized with mark compression deformity of a lower thoracic vertebra, age indeterminate but new since 2010. IMPRESSION: COPD changes without acute infiltrate.  Age-indeterminate compression fracture of a lower thoracic vertebra. Electronically Signed   By: Lavonia Dana M.D.   On: 02/03/2019 10:59   DG Pelvis 1-2 Views  Result Date: 02/03/2019 CLINICAL DATA:  Status post fall. EXAM: PELVIS - 1-2 VIEW COMPARISON:  None. FINDINGS: An acute fracture deformity is seen involving the proximal right femoral shaft. This is just below the intertrochanteric region. Lateral angulation of the fracture site is seen. Mild to moderate severity degenerative changes seen involving the bilateral hips. IMPRESSION: Acute fracture of the proximal right femur. Electronically Signed   By: Virgina Norfolk M.D.   On: 02/03/2019 19:11   DG Pelvis 1-2 Views  Result Date: 02/03/2019 CLINICAL DATA:  Fall, baseline confusion, pain in pelvis when moving for imaging EXAM: PELVIS - 1-2 VIEW COMPARISON:  None FINDINGS: Osseous demineralization. RIGHT SI joint poorly profiled. At LEFT SI joint and hip joint spaces preserved. No definite fracture, dislocation, or bone destruction. IMPRESSION: No definite acute osseous abnormalities. Electronically Signed   By: Lavonia Dana M.D.   On: 02/03/2019 11:00   DG Elbow Complete Right  Result Date: 02/03/2019 CLINICAL DATA:  Unwitnessed fall, swelling and bruising RIGHT elbow EXAM: RIGHT ELBOW - COMPLETE 3+ VIEW COMPARISON:  None FINDINGS: Osseous demineralization. Soft tissue swelling overlying the elbow and proximal forearm. Intra-articular olecranon fracture,  proximal fragment distracted 2.3 cm. Humerus and radius intact. IMPRESSION: Distracted intra-articular olecranon fracture with significant overlying soft tissue swelling. Electronically Signed   By: Lavonia Dana M.D.   On: 02/03/2019 10:57   CT Head Wo Contrast  Result Date: 02/03/2019 CLINICAL DATA:  Trauma to the head and neck. EXAM: CT HEAD WITHOUT CONTRAST CT CERVICAL SPINE WITHOUT CONTRAST TECHNIQUE: Multidetector CT imaging of the head and cervical spine was performed following the standard protocol without intravenous contrast. Multiplanar CT image reconstructions of the cervical spine were also generated. COMPARISON:  01/27/2012 FINDINGS: CT HEAD FINDINGS Brain: The brain does not show accelerated generalized atrophy. No focal finding affects the brainstem or cerebellum. 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 Mild chronic small-vessel change of the hemispheric white matter. No hydrocephalus, hemorrhage or extra-axial collection. Vascular: There is atherosclerotic calcification of the major vessels at the base of the brain. Skull: Left pterional craniotomy for previous aneurysm clipping. Otherwise negative. Sinuses/Orbits: Clear/normal Other: None CT CERVICAL SPINE FINDINGS Alignment: Normal except for 2 mm of degenerative anterolisthesis at C4-5. Skull base and vertebrae: No skull base fracture. No cervical spine fracture. Compression fracture at the superior endplate of T1 with loss of height of 10% or less. This is age indeterminate but could be recent. Soft tissues and spinal canal: Negative Disc levels: Chronic spondylosis at C5-6. Mild bilateral bony foraminal narrowing. Chronic facet osteoarthritis on the left at C4-5. Left bony foraminal encroachment because of this. Upper chest: Negative Other: None IMPRESSION: Head CT: No acute or traumatic finding. Previous left pterional craniotomy for aneurysm clipping. Cervical  spine CT: No cervical spine fracture. Minimal superior endplate compression fracture at T1 with loss of height of 10%. This is age indeterminate but could possibly be recent. Degenerative spondylosis at C5-6. Degenerative facet arthritis on the left at C4-5. Electronically Signed   By: Nelson Chimes M.D.   On: 02/03/2019 11:13   CT Cervical Spine Wo Contrast  Result Date: 02/03/2019 CLINICAL DATA:  Trauma to the head and neck. EXAM: CT HEAD WITHOUT CONTRAST CT CERVICAL SPINE WITHOUT CONTRAST TECHNIQUE:  Multidetector CT imaging of the head and cervical spine was performed following the standard protocol without intravenous contrast. Multiplanar CT image reconstructions of the cervical spine were also generated. COMPARISON:  01/27/2012 FINDINGS: CT HEAD FINDINGS Brain: The brain does not show accelerated generalized atrophy. No focal finding affects the brainstem or cerebellum. 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 Mild chronic small-vessel change of the hemispheric white matter. No hydrocephalus, hemorrhage or extra-axial collection. Vascular: There is atherosclerotic calcification of the major vessels at the base of the brain. Skull: Left pterional craniotomy for previous aneurysm clipping. Otherwise negative. Sinuses/Orbits: Clear/normal Other: None CT CERVICAL SPINE FINDINGS Alignment: Normal except for 2 mm of degenerative anterolisthesis at C4-5. Skull base and vertebrae: No skull base fracture. No cervical spine fracture. Compression fracture at the superior endplate of T1 with loss of height of 10% or less. This is age indeterminate but could be recent. Soft tissues and spinal canal: Negative Disc levels: Chronic spondylosis at C5-6. Mild bilateral bony foraminal narrowing. Chronic facet osteoarthritis on the left at C4-5. Left bony foraminal encroachment because of this. Upper chest: Negative Other: None IMPRESSION: Head CT:  No acute or traumatic finding. Previous left pterional craniotomy for aneurysm clipping. Cervical spine CT: No cervical spine fracture. Minimal superior endplate compression fracture at T1 with loss of height of 10%. This is age indeterminate but could possibly be recent. Degenerative spondylosis at C5-6. Degenerative facet arthritis on the left at C4-5. Electronically Signed   By: Nelson Chimes M.D.   On: 02/03/2019 11:13   CT Hip Right Wo Contrast  Result Date: 02/03/2019 CLINICAL DATA:  Hip fracture EXAM: CT OF THE RIGHT HIP WITHOUT CONTRAST TECHNIQUE: Multidetector CT imaging of the right hip was performed according to the standard protocol. Multiplanar CT image reconstructions were also generated. COMPARISON:  Femur fracture 02/03/2019 FINDINGS: Bones/Joint/Cartilage The osseous structures appear diffusely demineralized which may limit detection of small or nondisplaced fractures. Comminuted extracapsular right femur fracture with predominantly intertrochanteric extension but with complete separation of the greater and lesser trochanters from both the femoral neck and the subtrochanteric region (AO/OTA A3.3). This is an unstable femur fracture. Hyperdense hemorrhage is noted within the bony trabecula a about the fracture site. The femoral head remains normally located. There are chronic degenerative changes of the acetabuli. Periacetabular spurring and os acetabuli likely reflects sequela of prior glenoid degenerative change. Remaining imaged portions of the pelvic bones are intact. Ligaments Suboptimally assessed by CT. Muscles and Tendons There is soft tissue swelling and edematous changes throughout the musculature of the proximal thigh. No large joint effusion is seen likely as this fracture is extracapsular in nature. Soft tissues Extensive right hip soft tissue swelling and edematous changes. Hemorrhage about the fracture line within the bony trabecula, as detailed above. Included portions of the  pelvis demonstrates some atherosclerosis and un inflamed colonic diverticula but are otherwise unremarkable. IMPRESSION: Unstable comminuted extracapsular right femur fracture with predominantly intertrochanteric extension but with complete separation of the greater and lesser trochanters from both the femoral neck and the subtrochanteric region (AO/OTA A3.3). Hyperdense hemorrhage is noted within the bony trabecula about the fracture site. No large joint effusion likely as this fracture is extracapsular in nature. Associated right hip soft tissue swelling and edema. More chronic degenerative changes the right Electronically Signed   By: Lovena Le M.D.   On: 02/03/2019 22:47   DG Femur Portable Min 2 Views  Right  Result Date: 02/03/2019 CLINICAL DATA:  Status post fall. EXAM: RIGHT FEMUR PORTABLE 2 VIEW COMPARISON:  None. FINDINGS: Acute fracture deformity is seen involving shaft of the proximal right femur. This extends to the level just below the intertrochanteric region. Superolateral angulation of the fracture site is seen. There is no evidence of dislocation. IMPRESSION: Acute fracture of the proximal right femur. Electronically Signed   By: Virgina Norfolk M.D.   On: 02/03/2019 19:12    Review of Systems  Unable to perform ROS: Dementia   Blood pressure 109/70, pulse (!) 103, temperature 98.9 F (37.2 C), temperature source Oral, resp. rate (!) 23, height 5\' 6"  (1.676 m), weight 41 kg, SpO2 (!) 82 %. Physical Exam  Constitutional: She appears well-developed and well-nourished. No distress.  HENT:  Head: Normocephalic and atraumatic.  Eyes: Conjunctivae are normal. Right eye exhibits no discharge. Left eye exhibits no discharge. No scleral icterus.  Cardiovascular: Normal rate and regular rhythm.  Respiratory: Effort normal. No respiratory distress.  Musculoskeletal:     Cervical back: Normal range of motion.     Comments: RLE No traumatic wounds, ecchymosis, or rash  TTP  hip/knee  No knee or ankle effusion  Knee stable to varus/ valgus and anterior/posterior stress but painful  Sens DPN, SPN, TN grossly intact  Motor EHL, ext, flex, evers grossly intact  DP 2+, PT 1+, No significant edema  Neurological: She is alert.  Skin: Skin is warm and dry. She is not diaphoretic.  Psychiatric: She has a normal mood and affect. Her behavior is normal.    Assessment/Plan: Right hip fx -- Plan IMN today by Dr. Griffin Basil. Please keep NPO. Right knee TTP -- Will get x-rays. May need to get views in OR. Right olecranon fx -- Will need ORIF at later date. Continue splint and NWB. Multiple medical problems including dementia and cerebral artery aneurysm -- per primary service    Lisette Abu, PA-C Orthopedic Surgery [phone] 02/04/2019, 1:10 PM

## 2019-02-04 NOTE — Progress Notes (Signed)
PT Cancellation Note  Patient Details Name: [name] MRN: [id number] DOB: 1934-02-04   Cancelled Treatment:    Reason Eval/Treat Not Completed: Patient not medically ready. Order received. Appears pt to have surgical repair of fx today. Will check on tomorrow.    Shary Decamp [facility] 02/04/2019, 11:36 AM [place] Pager [phone] Office [phone]

## 2019-02-04 NOTE — Op Note (Signed)
Orthopaedic Surgery Operative Note (CSN: [id number])  DEBRALEE [name]  1934/10/24 Date of Surgery: 02/03/2019 - 02/04/2019   Diagnoses:  Right complex 4 part proximal femur fracture  Procedure: Right cephallomedullary nail   Operative Finding Successful completion of the planned procedure.  Complex fracture with separate greater trochanter piece but we had overall acceptable reduction in our screw was relatively low and posterior in the head.  Post-operative plan: The patient will be weightbearing as tolerated.  The patient will be readmitted to the Covid unit.  DVT prophylaxis Lovenox 40 mg/day until mobilizing and then consider transition in clinic to alternative medicines.   Pain control with PRN pain medication preferring oral medicines.  Plan to take the patient back to the operating room on Wednesday for ORIF of the olecranon.  Post-Op Diagnosis: Same Surgeons:Primary: Hiram Gash, MD Assistants:Caroline McBane PA-C Location: [facility] OR ROOM 03 Anesthesia: Spinal Antibiotics: 2 g Ancef Tourniquet time: * No tourniquets in log * Estimated Blood Loss: Minimal Complications: None Specimens: None Implants: Implant Name Type Inv. Item Serial No. Manufacturer Lot No. LRB No. Used Action  HFN RH 130 DEG 9MM X 340MM - [id number] Nail HFN RH 130 DEG 9MM X 340MM  [employer](ORTH,TRAU,BIO,SG) [id number] Right 1 Implanted  SCREW LAG HIP NAIL 10.5X95 - [id number] Screw SCREW LAG HIP NAIL 10.5X95  [employer](ORTH,TRAU,BIO,SG) [id number] Right 1 Implanted  SCREW BONE CORTICAL 5.0X42 - [id number] Screw SCREW BONE CORTICAL 5.0X42  [employer](ORTH,TRAU,BIO,SG) [id number] Right 1 Implanted  SCREW BONE CORTICAL 5.0X40 - [id number] Screw SCREW BONE CORTICAL 5.0X40  [employer](ORTH,TRAU,BIO,SG) [id number] Right 1 Implanted    Indications for Surgery:   [name] is a 85 y.o. female with fall resulting in four-part right proximal femur fracture and displaced olecranon fracture in a demented patient.   Discussed the nature of the injury as well as the care with the patient as well as the family.  Nonoperative measures are not well tolerated as patient's on bedrest for extended periods of time tend to develop secondary issues such as pneumonia, urinary tract infections, bedsores and delirium.  Based on this our recommendation is for operative measures. The risks benefits and alternatives were discussed with the patient including but not limited to the risks of nonoperative treatment, versus surgical intervention including infection, bleeding, nerve injury, periprosthetic fracture, the need for revision surgery, leg length discrepancy, gait change, blood clots, cardiopulmonary complications, morbidity, mortality, among others, and they were willing to proceed.    We did discuss doing her olecranon a different time as her anesthesia and her anesthetic time would not be ideal changing beds and staying the operating room for what ended up being 3 to 4 hours.  Procedure:   The patient was placed supine on a fracture table and appropriate reduction was obtained and visualized on fluoroscopy prior to the beginning of the procedure.  We prepped and draped in the usual sterile fashion.  We made an incision proximal to the greater trochanter and dissected down through the fascia.  We then carefully placed our starting pin localizing under fluoroscopy in the appropriate position on the tip of the greater trochanter.  We then advanced it into the proximal femur in typical fashion.  We used a crutch to aid in reduction prior to starting the case.  Entry reamer was used but the nail was unreamed.  At this point we placed our short nail localizing under fluoroscopy that it was at the appropriate level prior to using the outrigger device to pass  a wire and then the cephalo-medullary screw.   We did use a blunt bone hook to attempt to reduce the fracture even more and had an anatomic reduction.  We placed a derotational  wire and screw to avoid spinning the neck fragment.  The screw was locked proximally to avoid over collapse.  We took final shots at the proximal femur and then used the outrigger to place 2 distal interlock screw.  Final pictures were obtained.   We irrigated the wound copiously before placing local antibiotic as listed above.  Close the incision in a multilayer fashion with absorbable suture.  Sterile dressing was placed.  Patient was awoken taken to PACU in stable condition.  [name], PA-C, present and scrubbed throughout the case, critical for completion in a timely fashion, and for retraction, instrumentation, closure.

## 2019-02-04 NOTE — ED Notes (Signed)
Called [facility] for transport of patient to [facility]. [facility] made aware.

## 2019-02-04 NOTE — ED Notes (Signed)
Daughter [employer] notified of transfer  [phone]

## 2019-02-04 NOTE — ED Notes (Signed)
Pt transported to [facility] by [facility]

## 2019-02-04 NOTE — Progress Notes (Signed)
PROGRESS NOTE    [name]  YIF:[id number] DOB: [birth year] DOA: 02/03/2019 PCP: [name], MD    Brief Narrative:  85 year old lady prior history of dementia, cerebral artery aneurysm presented to ER with of right leg pain following a mechanical fall.  On arrival to ED she underwent x-rays showing a right intra-articular fracture and was given a posterior splint and discharged home.  She had another fall after returning home and sustained injury to the right leg.  X-rays of the right leg show acute fracture of the proximal right femur with lateral angulation just below the intertrochanteric region. Patient also tested positive for Covid but she does not have any respiratory or gastrointestinal symptoms at this time. Assessment & Plan:   Principal Problem:   Displaced segmental fracture of shaft of right femur, initial encounter for closed fracture [facility]) Active Problems:   Distal radius fracture, left   Dementia (HCC)   Right femur fracture Pain control X -rays/CT of the hip on the right shows Unstable comminuted extracapsular right femur fracture with predominantly intertrochanteric extension but with complete separation of the greater and lesser trochanters from both the femoral neck and the subtrochanteric region  Orthopedics consulted and plan for OR later today   Distal radius fracture on the right Splint placed. X-rays show Distracted intra-articular olecranon fracture with significant overlying soft tissue swelling.   COVID-19 positive No respiratory or  gastrointestinal symptoms. No indication of steroids.  Follow inflammatory markers.    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    Dementia Patient is pleasantly confused without any behavioral abnormalities.    DVT prophylaxis: Lovenox Code Status: Full code Family Communication: None at bedside Disposition Plan:   . Patient came from: Home            . Anticipated d/c place: Pending PT evaluation . Barriers to d/c OR conditions which need to be met to effect a safe d/c: Pending surgery   Consultants:   Orthopedics  Procedures: Surgical repair of the femur Antimicrobials: None  Subjective: Patient denies any pain, no chest pain no nausea vomiting, no abdominal pain no shortness of breath. Objective: Vitals:   02/04/19 0600 02/04/19 0630 02/04/19 0645 02/04/19 0700  BP: 108/69 109/70    Pulse: 86 89 88 (!) 103  Resp:   20 (!) 23  Temp:      TempSrc:      SpO2: 97% 96% 97% (!) 82%  Weight:      Height:        Intake/Output Summary (Last 24 hours) at 02/04/2019 0944 Last data filed at 02/04/2019 0726 Gross per 24 hour  Intake 600 ml  Output 300 ml  Net 300 ml   Filed Weights   02/03/19 1757  Weight: 41 kg    Examination:  General exam: Appears calm and comfortable  Respiratory system: Clear to auscultation. Respiratory effort normal. Cardiovascular system: S1 & S2 heard, RRR. No JVD,No pedal edema. Gastrointestinal system: Abdomen is nondistended, soft and nontender.. Normal bowel sounds heard. Central nervous system: Alert and confused Extremities: Right hip tenderness, right arm in splint Skin: No rashes, lesions or ulcers Psychiatry: Patient is pleasantly confused    Data Reviewed: I have personally reviewed following labs and imaging studies  CBC: Recent Labs  Lab 02/03/19 1026 02/03/19 1831 02/04/19 0421  WBC 8.5 9.5 8.9  NEUTROABS 6.9  --   --   HGB 13.0 12.3 10.8*  HCT 41.0 38.6 34.0*  MCV 98.3 100.0 98.6  PLT 298 316 182   Basic Metabolic Panel: Recent Labs  Lab 02/03/19 1026 02/03/19 1831 02/04/19 0421  NA 138 136 137  K 4.2 4.0 4.4  CL 101 101 102  CO2 _0 GLUCOSE 116* 129* 114*  BUN _1 CREATININE 0.71 0.76 0.66  CALCIUM 9.1 9.0 8.8*   GFR: Estimated Creatinine Clearance: 35.1 mL/min (by C-G formula based on SCr of 0.66  mg/dL). Liver Function Tests: Recent Labs  Lab 02/03/19 1026  AST 30  ALT 18  ALKPHOS 80  BILITOT 0.9  PROT 7.1  ALBUMIN 3.8   No results for input(s): LIPASE, AMYLASE in the last 168 hours. No results for input(s): AMMONIA in the last 168 hours. Coagulation Profile: No results for input(s): INR, PROTIME in the last 168 hours. Cardiac Enzymes: Recent Labs  Lab 02/03/19 1026  CKTOTAL 154   BNP (last 3 results) No results for input(s): PROBNP in the last 8760 hours. HbA1C: No results for input(s): HGBA1C in the last 72 hours. CBG: No results for input(s): GLUCAP in the last 168 hours. Lipid Profile: No results for input(s): CHOL, HDL, LDLCALC, TRIG, CHOLHDL, LDLDIRECT in the last 72 hours. Thyroid Function Tests: No results for input(s): TSH, T4TOTAL, FREET4, T3FREE, THYROIDAB in the last 72 hours. Anemia Panel: No results for input(s): VITAMINB12, FOLATE, FERRITIN, TIBC, IRON, RETICCTPCT in the last 72 hours. Sepsis Labs: No results for input(s): PROCALCITON, LATICACIDVEN in the last 168 hours.  Recent Results (from the past 240 hour(s))  Respiratory Panel by RT PCR (Flu A&B, Covid) - Nasopharyngeal Swab     Status: Abnormal   Collection Time: 02/03/19  7:19 PM   Specimen: Nasopharyngeal Swab  Result Value Ref Range Status   SARS Coronavirus 2 by RT PCR POSITIVE (A) NEGATIVE Final    Comment: RESULT CALLED TO, READ BACK BY AND VERIFIED WITH: T WALKER,RN _2  02/03/19 MKELLY (NOTE) SARS-CoV-2 target nucleic acids are DETECTED. SARS-CoV-2 RNA is generally detectable in upper respiratory specimens  during the acute phase of infection. Positive results are indicative of the presence of the identified virus, but do not rule out bacterial infection or co-infection with other pathogens not detected by the test. Clinical correlation with patient history and other diagnostic information is necessary to determine patient infection status. The expected result is  Negative. Fact Sheet for Patients:  PinkCheek.be Fact Sheet for Healthcare Providers: GravelBags.it This test is not yet approved or cleared by the [place] FDA and  has been authorized for detection and/or diagnosis of SARS-CoV-2 by FDA under an Emergency Use Authorization (EUA).  This EUA will remain in effect (meaning this test can be used) for  the duration of  the COVID-19 declaration under Section 564(b)(1) of the Act, 21 U.S.C. section 360bbb-3(b)(1), unless the authorization is terminated or revoked sooner.    Influenza A by PCR NEGATIVE NEGATIVE Final   Influenza B by PCR NEGATIVE NEGATIVE Final    Comment: (NOTE) The Xpert Xpress SARS-CoV-2/FLU/RSV assay is intended as an aid in  the diagnosis of influenza from Nasopharyngeal swab specimens and  should not be used as a sole basis for treatment. Nasal washings and  aspirates are unacceptable for Xpert Xpress SARS-CoV-2/FLU/RSV  testing. Fact Sheet for Patients: PinkCheek.be Fact Sheet for Healthcare Providers: GravelBags.it This test is not yet approved or cleared by the [place] FDA and  has been authorized for detection and/or diagnosis of SARS-CoV-2 by  FDA under an Emergency Use Authorization (EUA). This EUA will remain  in effect (meaning this test can be used) for the duration of the  Covid-19 declaration under Section 564(b)(1) of the Act, 21  U.S.C. section 360bbb-3(b)(1), unless the authorization is  terminated or revoked. Performed at [facility], [address] [place], [place] [address]          Radiology Studies: DG Chest 2 View  Result Date: 02/03/2019 CLINICAL DATA:  Fall, confusion at baseline EXAM: CHEST - 2 VIEW COMPARISON:  In a 01/23/2012, 01/27/2008 FINDINGS: Normal heart size, mediastinal contours, and pulmonary vascularity. Atherosclerotic calcification aorta.  Emphysematous and bronchitic changes consistent with COPD. Interstitial prominence, chronic. No infiltrate, pleural effusion or pneumothorax. Bones demineralized with mark compression deformity of a lower thoracic vertebra, age indeterminate but new since 2010. IMPRESSION: COPD changes without acute infiltrate. Age-indeterminate compression fracture of a lower thoracic vertebra. Electronically Signed   By: Lavonia Dana M.D.   On: 02/03/2019 10:59   DG Pelvis 1-2 Views  Result Date: 02/03/2019 CLINICAL DATA:  Status post fall. EXAM: PELVIS - 1-2 VIEW COMPARISON:  None. FINDINGS: An acute fracture deformity is seen involving the proximal right femoral shaft. This is just below the intertrochanteric region. Lateral angulation of the fracture site is seen. Mild to moderate severity degenerative changes seen involving the bilateral hips. IMPRESSION: Acute fracture of the proximal right femur. Electronically Signed   By: Virgina Norfolk M.D.   On: 02/03/2019 19:11   DG Pelvis 1-2 Views  Result Date: 02/03/2019 CLINICAL DATA:  Fall, baseline confusion, pain in pelvis when moving for imaging EXAM: PELVIS - 1-2 VIEW COMPARISON:  None FINDINGS: Osseous demineralization. RIGHT SI joint poorly profiled. At LEFT SI joint and hip joint spaces preserved. No definite fracture, dislocation, or bone destruction. IMPRESSION: No definite acute osseous abnormalities. Electronically Signed   By: Lavonia Dana M.D.   On: 02/03/2019 11:00   DG Elbow Complete Right  Result Date: 02/03/2019 CLINICAL DATA:  Unwitnessed fall, swelling and bruising RIGHT elbow EXAM: RIGHT ELBOW - COMPLETE 3+ VIEW COMPARISON:  None FINDINGS: Osseous demineralization. Soft tissue swelling overlying the elbow and proximal forearm. Intra-articular olecranon fracture, proximal fragment distracted 2.3 cm. Humerus and radius intact. IMPRESSION: Distracted intra-articular olecranon fracture with significant overlying soft tissue swelling. Electronically  Signed   By: Lavonia Dana M.D.   On: 02/03/2019 10:57   CT Head Wo Contrast  Result Date: 02/03/2019 CLINICAL DATA:  Trauma to the head and neck. EXAM: CT HEAD WITHOUT CONTRAST CT CERVICAL SPINE WITHOUT CONTRAST TECHNIQUE: Multidetector CT imaging of the head and cervical spine was performed following the standard protocol without intravenous contrast. Multiplanar CT image reconstructions of the cervical spine were also generated. COMPARISON:  01/27/2012 FINDINGS: CT HEAD FINDINGS Brain: The brain does not show accelerated generalized atrophy. No focal finding affects the brainstem or cerebellum. 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 Mild chronic small-vessel change of the hemispheric white matter. No hydrocephalus, hemorrhage or extra-axial collection. Vascular: There is atherosclerotic calcification of the major vessels at the base of the brain. Skull: Left pterional craniotomy for previous aneurysm clipping. Otherwise negative. Sinuses/Orbits: Clear/normal Other: None CT CERVICAL SPINE FINDINGS Alignment: Normal except for 2 mm of degenerative anterolisthesis at C4-5. Skull base and vertebrae: No skull base fracture. No cervical spine fracture. Compression fracture at the superior endplate of T1 with loss of height of 10% or less.  This is age indeterminate but could be recent. Soft tissues and spinal canal: Negative Disc levels: Chronic spondylosis at C5-6. Mild bilateral bony foraminal narrowing. Chronic facet osteoarthritis on the left at C4-5. Left bony foraminal encroachment because of this. Upper chest: Negative Other: None IMPRESSION: Head CT: No acute or traumatic finding. Previous left pterional craniotomy for aneurysm clipping. Cervical spine CT: No cervical spine fracture. Minimal superior endplate compression fracture at T1 with loss of height of 10%. This is age indeterminate but could possibly be recent. Degenerative  spondylosis at C5-6. Degenerative facet arthritis on the left at C4-5. Electronically Signed   By: Nelson Chimes M.D.   On: 02/03/2019 11:13   CT Cervical Spine Wo Contrast  Result Date: 02/03/2019 CLINICAL DATA:  Trauma to the head and neck. EXAM: CT HEAD WITHOUT CONTRAST CT CERVICAL SPINE WITHOUT CONTRAST TECHNIQUE: Multidetector CT imaging of the head and cervical spine was performed following the standard protocol without intravenous contrast. Multiplanar CT image reconstructions of the cervical spine were also generated. COMPARISON:  01/27/2012 FINDINGS: CT HEAD FINDINGS Brain: The brain does not show accelerated generalized atrophy. No focal finding affects the brainstem or cerebellum. 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 Mild chronic small-vessel change of the hemispheric white matter. No hydrocephalus, hemorrhage or extra-axial collection. Vascular: There is atherosclerotic calcification of the major vessels at the base of the brain. Skull: Left pterional craniotomy for previous aneurysm clipping. Otherwise negative. Sinuses/Orbits: Clear/normal Other: None CT CERVICAL SPINE FINDINGS Alignment: Normal except for 2 mm of degenerative anterolisthesis at C4-5. Skull base and vertebrae: No skull base fracture. No cervical spine fracture. Compression fracture at the superior endplate of T1 with loss of height of 10% or less. This is age indeterminate but could be recent. Soft tissues and spinal canal: Negative Disc levels: Chronic spondylosis at C5-6. Mild bilateral bony foraminal narrowing. Chronic facet osteoarthritis on the left at C4-5. Left bony foraminal encroachment because of this. Upper chest: Negative Other: None IMPRESSION: Head CT: No acute or traumatic finding. Previous left pterional craniotomy for aneurysm clipping. Cervical spine CT: No cervical spine fracture. Minimal superior endplate compression fracture at T1  with loss of height of 10%. This is age indeterminate but could possibly be recent. Degenerative spondylosis at C5-6. Degenerative facet arthritis on the left at C4-5. Electronically Signed   By: Nelson Chimes M.D.   On: 02/03/2019 11:13   CT Hip Right Wo Contrast  Result Date: 02/03/2019 CLINICAL DATA:  Hip fracture EXAM: CT OF THE RIGHT HIP WITHOUT CONTRAST TECHNIQUE: Multidetector CT imaging of the right hip was performed according to the standard protocol. Multiplanar CT image reconstructions were also generated. COMPARISON:  Femur fracture 02/03/2019 FINDINGS: Bones/Joint/Cartilage The osseous structures appear diffusely demineralized which may limit detection of small or nondisplaced fractures. Comminuted extracapsular right femur fracture with predominantly intertrochanteric extension but with complete separation of the greater and lesser trochanters from both the femoral neck and the subtrochanteric region (AO/OTA A3.3). This is an unstable femur fracture. Hyperdense hemorrhage is noted within the bony trabecula a about the fracture site. The femoral head remains normally located. There are chronic degenerative changes of the acetabuli. Periacetabular spurring and os acetabuli likely reflects sequela of prior glenoid degenerative change. Remaining imaged portions of the pelvic bones are intact. Ligaments Suboptimally assessed by CT. Muscles and Tendons There is soft tissue swelling and edematous changes throughout the musculature of the proximal thigh.  No large joint effusion is seen likely as this fracture is extracapsular in nature. Soft tissues Extensive right hip soft tissue swelling and edematous changes. Hemorrhage about the fracture line within the bony trabecula, as detailed above. Included portions of the pelvis demonstrates some atherosclerosis and un inflamed colonic diverticula but are otherwise unremarkable. IMPRESSION: Unstable comminuted extracapsular right femur fracture with predominantly  intertrochanteric extension but with complete separation of the greater and lesser trochanters from both the femoral neck and the subtrochanteric region (AO/OTA A3.3). Hyperdense hemorrhage is noted within the bony trabecula about the fracture site. No large joint effusion likely as this fracture is extracapsular in nature. Associated right hip soft tissue swelling and edema. More chronic degenerative changes the right Electronically Signed   By: Lovena Le M.D.   On: 02/03/2019 22:47   DG Femur Portable Min 2 Views Right  Result Date: 02/03/2019 CLINICAL DATA:  Status post fall. EXAM: RIGHT FEMUR PORTABLE 2 VIEW COMPARISON:  None. FINDINGS: Acute fracture deformity is seen involving shaft of the proximal right femur. This extends to the level just below the intertrochanteric region. Superolateral angulation of the fracture site is seen. There is no evidence of dislocation. IMPRESSION: Acute fracture of the proximal right femur. Electronically Signed   By: Virgina Norfolk M.D.   On: 02/03/2019 19:12        Scheduled Meds: . vitamin C  500 mg Oral BID  . Chlorhexidine Gluconate Cloth  6 each Topical Daily  . enoxaparin (LOVENOX) injection  40 mg Subcutaneous Q24H  . levETIRAcetam  500 mg Oral Q12H  . cholecalciferol  1,000 Units Oral Daily  . zinc sulfate  220 mg Oral Daily   Continuous Infusions: . lactated ringers 75 mL/hr at 02/03/19 2238  . methocarbamol (ROBAXIN) IV       LOS: 1 day        Hosie Poisson, MD [facility]   To contact the attending provider between 7A-7P or the covering provider during after hours 7P-7A, please log into the web site [email or web address] and access using universal [place] password for that web site. If you do not have the password, please call the hospital operator.  02/04/2019, 9:44 AM

## 2019-02-04 NOTE — Progress Notes (Signed)
Patient returned from surgery, o2 dropped to 88 on room air, RR increased to 27, HR 110-120. BP 108/63. MD notified, gave verbal order to place [name] 2LPM, order portable CXR.

## 2019-02-05 LAB — RETICULOCYTES
Immature Retic Fract: 8.8 % (ref 2.3–15.9)
RBC.: 2.45 MIL/uL — ABNORMAL LOW (ref 3.87–5.11)
Retic Count, Absolute: 46.3 10*3/uL (ref 19.0–186.0)
Retic Ct Pct: 1.9 % (ref 0.4–3.1)

## 2019-02-05 LAB — BASIC METABOLIC PANEL
Anion gap: 10 (ref 5–15)
BUN: 15 mg/dL (ref 8–23)
CO2: 23 mmol/L (ref 22–32)
Calcium: 8.3 mg/dL — ABNORMAL LOW (ref 8.9–10.3)
Chloride: 102 mmol/L (ref 98–111)
Creatinine, Ser: 0.75 mg/dL (ref 0.44–1.00)
GFR calc Af Amer: >60 mL/min (ref >60)
GFR calc non Af Amer: >60 mL/min (ref >60)
Glucose, Bld: 115 mg/dL — ABNORMAL HIGH (ref 70–99)
Potassium: 3.8 mmol/L (ref 3.5–5.1)
Sodium: 135 mmol/L (ref 135–145)

## 2019-02-05 LAB — CBC
HCT: 22.9 % — ABNORMAL LOW (ref 36.0–46.0)
Hemoglobin: 7.6 g/dL — ABNORMAL LOW (ref 12.0–15.0)
MCH: 32.1 pg (ref 26.0–34.0)
MCHC: 33.2 g/dL (ref 30.0–36.0)
MCV: 96.6 fL (ref 80.0–100.0)
Platelets: 231 10*3/uL (ref 150–400)
RBC: 2.37 MIL/uL — ABNORMAL LOW (ref 3.87–5.11)
RDW: 12.6 % (ref 11.5–15.5)
WBC: 8.3 10*3/uL (ref 4.0–10.5)
nRBC: 0 % (ref 0.0–0.2)

## 2019-02-05 LAB — IRON AND TIBC
Iron: 18 ug/dL — ABNORMAL LOW (ref 28–170)
Saturation Ratios: 8 % — ABNORMAL LOW (ref 10.4–31.8)
TIBC: 220 ug/dL — ABNORMAL LOW (ref 250–450)
UIBC: 202 ug/dL

## 2019-02-05 LAB — FERRITIN: Ferritin: 170 ng/mL (ref 11–307)

## 2019-02-05 LAB — VITAMIN B12: Vitamin B-12: 194 pg/mL (ref 180–914)

## 2019-02-05 LAB — FOLATE: Folate: 9.6 ng/mL (ref >5.9)

## 2019-02-05 MED ORDER — FERROUS SULFATE 325 (65 FE) MG PO TABS
325.0000 mg | ORAL_TABLET | Freq: Two times a day (BID) | ORAL | Status: DC
  Administered 2019-02-05 – 2019-02-14 (×16): 325 mg via ORAL
  Filled 2019-02-05 (×16): qty 1

## 2019-02-05 MED ORDER — BLISTEX MEDICATED EX OINT
TOPICAL_OINTMENT | CUTANEOUS | Status: DC | PRN
  Filled 2019-02-05: qty 6.3

## 2019-02-05 MED ORDER — ACETAMINOPHEN 325 MG PO TABS: 650.0000 mg | ORAL_TABLET | ORAL | Status: DC | PRN

## 2019-02-05 MED ORDER — SENNOSIDES-DOCUSATE SODIUM 8.6-50 MG PO TABS
2.0000 | ORAL_TABLET | Freq: Two times a day (BID) | ORAL | Status: DC
  Administered 2019-02-06 – 2019-02-14 (×14): 2 via ORAL
  Filled 2019-02-05 (×15): qty 2

## 2019-02-05 MED ORDER — ENSURE ENLIVE PO LIQD
237.0000 mL | Freq: Two times a day (BID) | ORAL | Status: DC
  Administered 2019-02-06 – 2019-02-11 (×10): 237 mL via ORAL

## 2019-02-05 MED ORDER — ADULT MULTIVITAMIN W/MINERALS CH
1.0000 | ORAL_TABLET | Freq: Every day | ORAL | Status: DC
  Administered 2019-02-05 – 2019-02-14 (×9): 1 via ORAL
  Filled 2019-02-05 (×9): qty 1

## 2019-02-05 NOTE — Progress Notes (Signed)
PROGRESS NOTE    [name]  VWU:[id number] DOB: [birth year] DOA: 02/03/2019 PCP: [name], MD    Brief Narrative:  85 year old lady prior history of dementia, cerebral artery aneurysm presented to ER with of right leg pain following a mechanical fall.  On arrival to ED she underwent x-rays showing a right intra-articular fracture and was given a posterior splint and discharged home.  She had another fall after returning home and sustained injury to the right leg.  X-rays of the right leg show acute fracture of the proximal right femur with lateral angulation just below the intertrochanteric region. Patient also tested positive for Covid but she does not have any respiratory or gastrointestinal symptoms at this time.  Assessment & Plan:   Principal Problem:   Displaced segmental fracture of shaft of right femur, initial encounter for closed fracture [facility]) Active Problems:   Distal radius fracture, left   Dementia ([place])   Right femur fracture  X -rays/CT of the hip on the right shows Unstable comminuted extracapsular right femur fracture with predominantly intertrochanteric extension but with complete separation of the greater and lesser trochanters from both the femoral neck and the subtrochanteric region  Orthopedics consulted and underwent  Right cephallomedullary nail placement on 02/04/19 by [name].  Plan for OR on Wed for ORIF R Olecranon fx with Dr. Griffin Basil. PT evaluation recommending SNF.  TOC consult placed.    Distal radius fracture on the right Splint placed. X-rays show Distracted intra-articular olecranon fracture with significant overlying soft tissue swelling. OR on Wed for ORIF R Olecranon fx with Dr. Griffin Basil   COVID-19 positive No respiratory or  gastrointestinal symptoms. But post op pt was hypoxic and required about 2l it of [place] oxygen. Suspect its from anesthesia. No indication of steroids.  Follow inflammatory markers.    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    Dementia Patient is pleasantly confused without any behavioral abnormalities.  Anemia of blood loss from surgery:  Baseline hemoglobin is between 10 to 12. Post op hemoglobin dropped to 8 and to 7.6 this am.  Anemia panel shows low iron levels.  Iron supplementation added.  Transfuse to keep hemoglobin greater than 7.   DVT prophylaxis: Lovenox Code Status: Full code Family Communication: None at bedside Disposition Plan:  . Patient came from: Home            . Anticipated d/c place: SNF . Barriers to d/c OR conditions which need to be met to effect a safe d/c: Pending surgery   Consultants:   Orthopedics  Procedures: Surgical repair of the femur Antimicrobials: None  Subjective:  PT CONFUSED, denies any new complaints.    Objective: Vitals:   02/05/19 1207 02/05/19 1232 02/05/19 1323 02/05/19 1325  BP:      Pulse: 99 93 (!) 106 99  Resp: 19 17 (!) 21 20  Temp:      TempSrc:      SpO2: 98% 98% 99% 99%  Weight:      Height:        Intake/Output Summary (Last 24 hours) at 02/05/2019 1611 Last data filed at 02/05/2019 1503 Gross per 24 hour  Intake 3729.85 ml  Output 1400 ml  Net 2329.85 ml   Filed Weights   02/03/19 1757  Weight: 41 kg    Examination:  General exam: alert and comfortable. On 2 lit of [place] oxygen.  Respiratory system: clear to auscultation,  no wheezing or rhonchi.  Cardiovascular system: s1s2, RRR, no JVD, no pedal edema.  Gastrointestinal system: abdomen is soft, non tender non distended, bowel sounds normal.  Central nervous system: alert and confused.  Extremities: Right hip tenderness, right arm in splint Skin: no rashes.  Psychiatry: pleasantly confused.     Data Reviewed: I have personally reviewed following labs and imaging studies  CBC: Recent Labs  Lab 02/03/19 1026 02/03/19 1831 02/04/19 0421 02/04/19 1825  02/05/19 0352  WBC 8.5 9.5 8.9 10.5 8.3  NEUTROABS 6.9  --   --   --   --   HGB 13.0 12.3 10.8* 8.0* 7.6*  HCT 41.0 38.6 34.0* 24.4* 22.9*  MCV 98.3 100.0 98.6 96.8 96.6  PLT 298 316 282 249 300   Basic Metabolic Panel: Recent Labs  Lab 02/03/19 1026 02/03/19 1831 02/04/19 0421 02/04/19 1825 02/05/19 0352  NA 138 136 137  --  135  K 4.2 4.0 4.4  --  3.8  CL 101 101 102  --  102  CO2 29 26 27   --  23  GLUCOSE 116* 129* 114*  --  115*  BUN 19 18 19   --  15  CREATININE 0.71 0.76 0.66 0.88 0.75  CALCIUM 9.1 9.0 8.8*  --  8.3*   GFR: Estimated Creatinine Clearance: 35.1 mL/min (by C-G formula based on SCr of 0.75 mg/dL). Liver Function Tests: Recent Labs  Lab 02/03/19 1026  AST 30  ALT 18  ALKPHOS 80  BILITOT 0.9  PROT 7.1  ALBUMIN 3.8   No results for input(s): LIPASE, AMYLASE in the last 168 hours. No results for input(s): AMMONIA in the last 168 hours. Coagulation Profile: No results for input(s): INR, PROTIME in the last 168 hours. Cardiac Enzymes: Recent Labs  Lab 02/03/19 1026  CKTOTAL 154   BNP (last 3 results) No results for input(s): PROBNP in the last 8760 hours. HbA1C: No results for input(s): HGBA1C in the last 72 hours. CBG: No results for input(s): GLUCAP in the last 168 hours. Lipid Profile: No results for input(s): CHOL, HDL, LDLCALC, TRIG, CHOLHDL, LDLDIRECT in the last 72 hours. Thyroid Function Tests: No results for input(s): TSH, T4TOTAL, FREET4, T3FREE, THYROIDAB in the last 72 hours. Anemia Panel: Recent Labs    02/05/19 1334  VITAMINB12 194  FOLATE 9.6  FERRITIN 170  TIBC 220*  IRON 18*  RETICCTPCT 1.9   Sepsis Labs: No results for input(s): PROCALCITON, LATICACIDVEN in the last 168 hours.  Recent Results (from the past 240 hour(s))  Respiratory Panel by RT PCR (Flu A&B, Covid) - Nasopharyngeal Swab     Status: Abnormal   Collection Time: 02/03/19  7:19 PM   Specimen: Nasopharyngeal Swab  Result Value Ref Range Status    SARS Coronavirus 2 by RT PCR POSITIVE (A) NEGATIVE Final    Comment: RESULT CALLED TO, READ BACK BY AND VERIFIED WITH: T WALKER,RN @2058  02/03/19 MKELLY (NOTE) SARS-CoV-2 target nucleic acids are DETECTED. SARS-CoV-2 RNA is generally detectable in upper respiratory specimens  during the acute phase of infection. Positive results are indicative of the presence of the identified virus, but do not rule out bacterial infection or co-infection with other pathogens not detected by the test. Clinical correlation with patient history and other diagnostic information is necessary to determine patient infection status. The expected result is Negative. Fact Sheet for Patients:  PinkCheek.be Fact Sheet for Healthcare Providers: GravelBags.it This test is not yet approved or cleared by the [place] and  has been authorized for detection and/or diagnosis of SARS-CoV-2 by FDA under an Emergency Use Authorization (EUA).  This EUA will remain in effect (meaning this test can be used) for  the duration of  the COVID-19 declaration under Section 564(b)(1) of the Act, 21 U.S.C. section 360bbb-3(b)(1), unless the authorization is terminated or revoked sooner.    Influenza A by PCR NEGATIVE NEGATIVE Final   Influenza B by PCR NEGATIVE NEGATIVE Final    Comment: (NOTE) The Xpert Xpress SARS-CoV-2/FLU/RSV assay is intended as an aid in  the diagnosis of influenza from Nasopharyngeal swab specimens and  should not be used as a sole basis for treatment. Nasal washings and  aspirates are unacceptable for Xpert Xpress SARS-CoV-2/FLU/RSV  testing. Fact Sheet for Patients: PinkCheek.be Fact Sheet for Healthcare Providers: GravelBags.it This test is not yet approved or cleared by the [place] FDA and  has been authorized for detection and/or diagnosis of SARS-CoV-2 by  FDA under an  Emergency Use Authorization (EUA). This EUA will remain  in effect (meaning this test can be used) for the duration of the  Covid-19 declaration under Section 564(b)(1) of the Act, 21  U.S.C. section 360bbb-3(b)(1), unless the authorization is  terminated or revoked. Performed at [facility], [address], [place], [place] [address]   Surgical PCR screen     Status: None   Collection Time: 02/04/19  1:12 PM   Specimen: Nasal Mucosa; Nasal Swab  Result Value Ref Range Status   MRSA, PCR NEGATIVE NEGATIVE Final   Staphylococcus aureus NEGATIVE NEGATIVE Final    Comment: (NOTE) The Xpert SA Assay (FDA approved for NASAL specimens in patients 47 years of age and older), is one component of a comprehensive surveillance program. It is not intended to diagnose infection nor to guide or monitor treatment. Performed at [facility], [place] [address], [place],  [address]          Radiology Studies: DG Pelvis 1-2 Views  Result Date: 02/03/2019 CLINICAL DATA:  Status post fall. EXAM: PELVIS - 1-2 VIEW COMPARISON:  None. FINDINGS: An acute fracture deformity is seen involving the proximal right femoral shaft. This is just below the intertrochanteric region. Lateral angulation of the fracture site is seen. Mild to moderate severity degenerative changes seen involving the bilateral hips. IMPRESSION: Acute fracture of the proximal right femur. Electronically Signed   By: Virgina Norfolk M.D.   On: 02/03/2019 19:11   CT Hip Right Wo Contrast  Result Date: 02/03/2019 CLINICAL DATA:  Hip fracture EXAM: CT OF THE RIGHT HIP WITHOUT CONTRAST TECHNIQUE: Multidetector CT imaging of the right hip was performed according to the standard protocol. Multiplanar CT image reconstructions were also generated. COMPARISON:  Femur fracture 02/03/2019 FINDINGS: Bones/Joint/Cartilage The osseous structures appear diffusely demineralized which may limit detection of small or nondisplaced fractures.  Comminuted extracapsular right femur fracture with predominantly intertrochanteric extension but with complete separation of the greater and lesser trochanters from both the femoral neck and the subtrochanteric region (AO/OTA A3.3). This is an unstable femur fracture. Hyperdense hemorrhage is noted within the bony trabecula a about the fracture site. The femoral head remains normally located. There are chronic degenerative changes of the acetabuli. Periacetabular spurring and os acetabuli likely reflects sequela of prior glenoid degenerative change. Remaining imaged portions of the pelvic bones are intact. Ligaments Suboptimally assessed by CT. Muscles and Tendons There is soft tissue swelling and edematous changes throughout the musculature of the proximal thigh. No large joint effusion is  seen likely as this fracture is extracapsular in nature. Soft tissues Extensive right hip soft tissue swelling and edematous changes. Hemorrhage about the fracture line within the bony trabecula, as detailed above. Included portions of the pelvis demonstrates some atherosclerosis and un inflamed colonic diverticula but are otherwise unremarkable. IMPRESSION: Unstable comminuted extracapsular right femur fracture with predominantly intertrochanteric extension but with complete separation of the greater and lesser trochanters from both the femoral neck and the subtrochanteric region (AO/OTA A3.3). Hyperdense hemorrhage is noted within the bony trabecula about the fracture site. No large joint effusion likely as this fracture is extracapsular in nature. Associated right hip soft tissue swelling and edema. More chronic degenerative changes the right Electronically Signed   By: Lovena Le M.D.   On: 02/03/2019 22:47   DG CHEST PORT 1 VIEW  Result Date: 02/04/2019 CLINICAL DATA:  Oxygen desaturation. EXAM: PORTABLE CHEST 1 VIEW COMPARISON:  February 03, 2019 FINDINGS: The heart, hila, and mediastinum are normal. No pneumothorax.  No nodules or masses. No focal infiltrates. IMPRESSION: No active disease. Electronically Signed   By: Dorise Bullion III M.D   On: 02/04/2019 19:52   DG C-Arm 1-60 Min  Result Date: 02/04/2019 CLINICAL DATA:  Femur fracture fixation EXAM: DG C-ARM 1-60 MIN; RIGHT FEMUR 2 VIEWS FLUOROSCOPY TIME:  Fluoroscopy Time:  1 minutes 50 seconds Number of Acquired Spot Images: 4 COMPARISON:  None. FINDINGS: Intraoperative radiographs demonstrate trochanteric femoral nail and intramedullary rod fixation proximal right femur shaft fracture. There are 2 distal interlocking screws. Alignment appears improved. IMPRESSION: Fluoroscopic guidance for fixation of right femur fracture with improved alignment Electronically Signed   By: Macy Mis M.D.   On: 02/04/2019 16:51   DG FEMUR, MIN 2 VIEWS RIGHT  Result Date: 02/04/2019 CLINICAL DATA:  Femur fracture fixation EXAM: DG C-ARM 1-60 MIN; RIGHT FEMUR 2 VIEWS FLUOROSCOPY TIME:  Fluoroscopy Time:  1 minutes 50 seconds Number of Acquired Spot Images: 4 COMPARISON:  None. FINDINGS: Intraoperative radiographs demonstrate trochanteric femoral nail and intramedullary rod fixation proximal right femur shaft fracture. There are 2 distal interlocking screws. Alignment appears improved. IMPRESSION: Fluoroscopic guidance for fixation of right femur fracture with improved alignment Electronically Signed   By: Macy Mis M.D.   On: 02/04/2019 16:51   DG FEMUR PORT, MIN 2 VIEWS RIGHT  Result Date: 02/04/2019 CLINICAL DATA:  Postoperative evaluation. EXAM: RIGHT FEMUR PORTABLE 2 VIEW COMPARISON:  None. FINDINGS: A radiopaque intramedullary rod is seen along the length of the right femur. A radiopaque compression screw device is noted within the right femoral head and neck. An inter trochanteric fracture of the proximal right femur is seen with gross anatomic alignment. Soft tissues are unremarkable. IMPRESSION: Status post open reduction and internal fixation of the proximal  right femur. Electronically Signed   By: Virgina Norfolk M.D.   On: 02/04/2019 18:07   DG Femur Portable Min 2 Views Right  Result Date: 02/03/2019 CLINICAL DATA:  Status post fall. EXAM: RIGHT FEMUR PORTABLE 2 VIEW COMPARISON:  None. FINDINGS: Acute fracture deformity is seen involving shaft of the proximal right femur. This extends to the level just below the intertrochanteric region. Superolateral angulation of the fracture site is seen. There is no evidence of dislocation. IMPRESSION: Acute fracture of the proximal right femur. Electronically Signed   By: Virgina Norfolk M.D.   On: 02/03/2019 19:12        Scheduled Meds: . acetaminophen  1,000 mg Oral Once  . vitamin C  500 mg  Oral BID  . celecoxib  200 mg Oral Once  . chlorhexidine  60 mL Topical Once  . Chlorhexidine Gluconate Cloth  6 each Topical Daily  . docusate sodium  100 mg Oral BID  . enoxaparin (LOVENOX) injection  40 mg Subcutaneous Q24H  . feeding supplement (ENSURE ENLIVE)  237 mL Oral BID BM  . multivitamin with minerals  1 tablet Oral Daily  . mupirocin ointment  1 application Nasal BID  . povidone-iodine  2 application Topical Once  . cholecalciferol  1,000 Units Oral Daily  . zinc sulfate  220 mg Oral Daily   Continuous Infusions: . [START ON 02/09/2019]  ceFAZolin (ANCEF) IV    . lactated ringers 75 mL/hr at 02/05/19 0905  . levETIRAcetam 500 mg (02/05/19 0906)  . methocarbamol (ROBAXIN) IV       LOS: 2 days        Hosie Poisson, MD [facility]   To contact the attending provider between 7A-7P or the covering provider during after hours 7P-7A, please log into the web site [email or web address] and access using universal [place] password for that web site. If you do not have the password, please call the hospital operator.  02/05/2019, 4:11 PM

## 2019-02-05 NOTE — Progress Notes (Signed)
Pt non-redirectable by nursing staff. Pt keeps asking her for father and screams whenever staff touch her. She is hitting and scratching staff when attempting to reposition her in bed. Pt pulling off telemetry leads, pulse ox, and RUE splint. Bilateral mitts applied at this time.   Hiram Comber, RN 02/05/2019 5:35 PM

## 2019-02-05 NOTE — Progress Notes (Signed)
Spoke with patient's daughter and updated her on her mom. All questions answered.

## 2019-02-05 NOTE — Evaluation (Signed)
Physical Therapy Evaluation Patient Details Name: [name] MRN: [id number] DOB: [birth year] Today's Date: 02/05/2019   History of Present Illness  Pt adm after fall with displaced segmental fracture of the rt femur. Pt found to be Covid+. Pt had fall earlier in the day suffering a displaced olecranon fx on the rt and was splinted and dc'd home from the ED before suffering second fall. Pt underwent ORIF of rt femur on 1/29. Pt to have operative repair of rt olecranon at later date. PMH - dementia, craniotomy for aneurysm clipping.  Clinical Impression  Pt presents to PT with limited mobility due to hip and elbow fracture and pain related to those. Expect pt's progress will be slow due to dementia but hopefully as pain decreases she will be able to do more.    Follow Up Recommendations SNF;Supervision/Assistance - 24 hour    Equipment Recommendations  Other (comment)(To be determined)    Recommendations for Other Services       Precautions / Restrictions Precautions Precautions: Fall Precaution Comments: x2 falls at home PTA Restrictions Weight Bearing Restrictions: Yes RUE Weight Bearing: Non weight bearing RLE Weight Bearing: Weight bearing as tolerated      Mobility  Bed Mobility Overal bed mobility: Needs Assistance Bed Mobility: Rolling;Supine to Sit;Sit to Supine Rolling: Max assist;+2 for physical assistance;+2 for safety/equipment;Total assist   Supine to sit: Total assist;+2 for physical assistance;+2 for safety/equipment Sit to supine: Total assist;+2 for physical assistance;+2 for safety/equipment   General bed mobility comments: Assist with all aspects due to pain in RLE. At end of session left in supine with RLE extended and HOB at 5 degrees to promote extension of RLE due to pt tends to hold in flexion and internal rotation  Transfers Overall transfer level: Needs assistance               General transfer comment: Attempted to stand with +2 total  using bed pad under hips but pt unable to tolerate due to pain in RLE and unable to clear buttocks from bed.   Ambulation/Gait                Stairs            Wheelchair Mobility    Modified Rankin (Stroke Patients Only)       Balance Overall balance assessment: Needs assistance;History of Falls Sitting-balance support: Feet supported;Single extremity supported Sitting balance-Leahy Scale: Fair Sitting balance - Comments: minguard-minA for static sitting balance                                     Pertinent Vitals/Pain Pain Assessment: Faces Faces Pain Scale: Hurts whole lot Pain Location: R hip with certain movements Pain Descriptors / Indicators: Discomfort;Grimacing;Crying;Sore Pain Intervention(s): Limited activity within patient's tolerance;Monitored during session;Repositioned    Home Living Family/patient expects to be discharged to:: Private residence                 Additional Comments: per chart pt in from home, pt not able to provide home setup given baseline dementia    Prior Function           Comments: per chart pt was ambulatory to bathroom without assist in her home     Hand Dominance   Dominant Hand: (pt states she doesn't know when asked)    Extremity/Trunk Assessment   Upper Extremity Assessment Upper Extremity Assessment: Defer to  OT evaluation RUE Deficits / Details: RUE currently splinted/ace wrapped, no apparent pain with repositioning UE, requires cues not to WB through UE RUE: Unable to fully assess due to immobilization RUE Coordination: decreased gross motor;decreased fine motor    Lower Extremity Assessment Lower Extremity Assessment: Generalized weakness;RLE deficits/detail RLE Deficits / Details: Limited by pain from hip fx. Pt draws leg up into flexion and internal rotation. With gentle, constant pressure able to extend LE almost fully in supine.        Communication   Communication: No  difficulties  Cognition Arousal/Alertness: Awake/alert Behavior During Therapy: WFL for tasks assessed/performed Overall Cognitive Status: History of cognitive impairments - at baseline                                 General Comments: pt with history of dementia, pleasantly confused during session, oriented to name and able to state DOB, required frequent reminders about her fall/hip injury       General Comments General comments (skin integrity, edema, etc.): SpO2 >94% on 2L O2. HR to 110's with activity    Exercises     Assessment/Plan    PT Assessment Patient needs continued PT services  PT Problem List Decreased strength;Decreased range of motion;Decreased activity tolerance;Decreased balance;Decreased mobility;Decreased cognition;Decreased knowledge of precautions;Pain       PT Treatment Interventions DME instruction;Gait training;Functional mobility training;Therapeutic activities;Therapeutic exercise;Balance training;Patient/family education    PT Goals (Current goals can be found in the Care Plan section)  Acute Rehab PT Goals Patient Stated Goal: Pt didn't state PT Goal Formulation: Patient unable to participate in goal setting Time For Goal Achievement: 02/19/19 Potential to Achieve Goals: Fair    Frequency Min 3X/week   Barriers to discharge Decreased caregiver support May live alone    Co-evaluation PT/OT/SLP Co-Evaluation/Treatment: Yes Reason for Co-Treatment: For patient/therapist safety;Necessary to address cognition/behavior during functional activity;Complexity of the patient's impairments (multi-system involvement) PT goals addressed during session: Mobility/safety with mobility;Balance OT goals addressed during session: ADL's and self-care       AM-PAC PT "6 Clicks" Mobility  Outcome Measure Help needed turning from your back to your side while in a flat bed without using bedrails?: Total Help needed moving from lying on your back to  sitting on the side of a flat bed without using bedrails?: Total Help needed moving to and from a bed to a chair (including a wheelchair)?: Total Help needed standing up from a chair using your arms (e.g., wheelchair or bedside chair)?: Total Help needed to walk in hospital room?: Total Help needed climbing 3-5 steps with a railing? : Total 6 Click Score: 6    End of Session Equipment Utilized During Treatment: Oxygen Activity Tolerance: Patient limited by pain Patient left: in bed;with call bell/phone within reach;with bed alarm set Nurse Communication: Mobility status PT Visit Diagnosis: Other abnormalities of gait and mobility (R26.89);History of falling (Z91.81);Muscle weakness (generalized) (M62.81);Pain Pain - Right/Left: Right Pain - part of body: Hip;Leg    Time: [id number] PT Time Calculation (min) (ACUTE ONLY): 26 min   Charges:   PT Evaluation $PT Eval Moderate Complexity: [place] Pager [phone] Office [place] 02/05/2019, 2:09 PM

## 2019-02-05 NOTE — Evaluation (Signed)
Occupational Therapy Evaluation Patient Details Name: [name] MRN: [id number] DOB: 1934/05/23 Today's Date: 02/05/2019    History of Present Illness Pt adm after fall with displaced segmental fracture of the rt femur. Pt found to be Covid+. Pt had fall earlier in the day suffering a displaced olecranon fx on the rt and was splinted and dc'd home from the ED before suffering second fall. Pt underwent ORIF of rt femur on 1/29. Pt to have operative repair of rt olecranon at later date. PMH - dementia, craniotomy for aneurysm clipping.   Clinical Impression   This 85 y/o female presents with the above. Pt with history of dementia and unable to provide full PLOF, however per chart pt was ambulatory to bathroom in her home, noted history of falls PTA. Pt pleasantly confused this session, mostly limited due to RLE pain with movements. Pt requiring two person assist for completion of bed mobility, once positioned EOB pt tolerating well, able to maintain sitting approx 10 min during completion of simple ADL with minA, intermittently able to maintain sitting balance with minguard assist. Pt currently requiring totalA for LB ADL. She does not appear to have much pain in RUE currently and requires intermittent cues not to place weight through RUE while seated. Pt will benefit from continued acute OT services and currently recommend follow up therapy services in SNF setting to maximize her safety and independence with ADL and mobility. Will follow.     Follow Up Recommendations  SNF;Supervision/Assistance - 24 hour    Equipment Recommendations  Other (comment)(defer to next venue)           Precautions / Restrictions Precautions Precautions: Fall Precaution Comments: x2 falls at home PTA Restrictions Weight Bearing Restrictions: Yes RUE Weight Bearing: Non weight bearing RLE Weight Bearing: Weight bearing as tolerated      Mobility Bed Mobility Overal bed mobility: Needs Assistance Bed  Mobility: Rolling;Supine to Sit;Sit to Supine Rolling: Max assist;+2 for physical assistance;+2 for safety/equipment;Total assist   Supine to sit: Max assist;Total assist;+2 for physical assistance;+2 for safety/equipment Sit to supine: Max assist;Total assist;+2 for physical assistance;+2 for safety/equipment   General bed mobility comments: pt lying in sidelying on L upon arrival, required assist for LEs over EOB and trunk elevation, pt mostly limited due to RLE pain with any movement, left in supine end of session to allow for RLE extension (as pt tending to maintain RLE flexed)  Transfers Overall transfer level: Needs assistance               General transfer comment: totalA+2 to attempt to stand at [facility], pt with increased pain with attempts to place weight through RLE, unable to full clear buttocks from EOB     Balance Overall balance assessment: Needs assistance;History of Falls Sitting-balance support: Feet supported;Single extremity supported Sitting balance-Leahy Scale: Fair Sitting balance - Comments: minguard-minA for static sitting balance                                   ADL either performed or assessed with clinical judgement   ADL Overall ADL's : Needs assistance/impaired Eating/Feeding: Set up;Supervision/ safety;Sitting   Grooming: Minimal assistance;Sitting;Brushing hair Grooming Details (indicate cue type and reason): seated EOB, minA to initiate task, pt easily distracted and only able to carry out task for short period of time  Upper Body Bathing: Moderate assistance;Sitting   Lower Body Bathing: Maximal assistance;Total assistance;Sitting/lateral leans;Bed level  Upper Body Dressing : Moderate assistance;Sitting   Lower Body Dressing: Maximal assistance;Total assistance;Sitting/lateral leans;Bed level       Toileting- Clothing Manipulation and Hygiene: Total assistance;Sitting/lateral lean;Bed level         General ADL Comments:  pt tolerated sitting EOB approx 10 min during session, limited due to pain with mobility, weakness, impaired cognition                          Pertinent Vitals/Pain Pain Assessment: Faces Faces Pain Scale: Hurts whole lot Pain Location: R hip with certain movements Pain Descriptors / Indicators: Discomfort;Grimacing;Crying;Sore Pain Intervention(s): Limited activity within patient's tolerance;Monitored during session;Repositioned;Utilized relaxation techniques     Hand Dominance (pt states she doesn't know when asked)   Extremity/Trunk Assessment Upper Extremity Assessment Upper Extremity Assessment: Generalized weakness;RUE deficits/detail RUE Deficits / Details: RUE currently splinted/ace wrapped, no apparent pain with repositioning UE, requires cues not to WB through UE RUE: Unable to fully assess due to immobilization RUE Coordination: decreased gross motor;decreased fine motor   Lower Extremity Assessment Lower Extremity Assessment: Defer to PT evaluation       Communication Communication Communication: No difficulties   Cognition Arousal/Alertness: Awake/alert Behavior During Therapy: WFL for tasks assessed/performed Overall Cognitive Status: History of cognitive impairments - at baseline                                 General Comments: pt with history of dementia, pleasantly confused during session, oriented to name and able to state DOB, required frequent reminders about her fall/hip injury    General Comments  VSS, HR up to 110s with activity    Exercises     Shoulder Instructions      Home Living Family/patient expects to be discharged to:: Private residence                                 Additional Comments: per chart pt in from home, pt not able to provide home setup given baseline dementia      Prior Functioning/Environment          Comments: per chart pt was ambulatory to bathroom without assist in her home         OT Problem List: Decreased strength;Decreased activity tolerance;Decreased range of motion;Impaired balance (sitting and/or standing);Decreased cognition;Decreased safety awareness;Decreased knowledge of use of DME or AE;Decreased knowledge of precautions;Pain;Impaired UE functional use      OT Treatment/Interventions: Self-care/ADL training;Therapeutic exercise;Energy conservation;DME and/or AE instruction;Therapeutic activities;Patient/family education;Balance training;Cognitive remediation/compensation    OT Goals(Current goals can be found in the care plan section) Acute Rehab OT Goals Patient Stated Goal: less pain OT Goal Formulation: With patient Time For Goal Achievement: 02/19/19 Potential to Achieve Goals: Good  OT Frequency: Min 2X/week   Barriers to D/C:            Co-evaluation PT/OT/SLP Co-Evaluation/Treatment: Yes Reason for Co-Treatment: To address functional/ADL transfers;Complexity of the patient's impairments (multi-system involvement);Necessary to address cognition/behavior during functional activity;For patient/therapist safety   OT goals addressed during session: ADL's and self-care      AM-PAC OT "6 Clicks" Daily Activity     Outcome Measure Help from another person eating meals?: A Little Help from another person taking care of personal grooming?: A Little Help from another person toileting, which includes using toliet, bedpan, or urinal?:  Total Help from another person bathing (including washing, rinsing, drying)?: A Lot Help from another person to put on and taking off regular upper body clothing?: A Lot Help from another person to put on and taking off regular lower body clothing?: Total 6 Click Score: 12   End of Session Nurse Communication: Mobility status  Activity Tolerance: Patient tolerated treatment well;Patient limited by pain Patient left: in bed;with call bell/phone within reach;with bed alarm set  OT Visit Diagnosis: Other  abnormalities of gait and mobility (R26.89);Pain;Other symptoms and signs involving cognitive function Pain - Right/Left: Right Pain - part of body: Hip;Leg                Time: [id number] OT Time Calculation (min): 28 min Charges:  OT General Charges $OT Visit: 1 Visit OT Evaluation $OT Eval Moderate Complexity: [place], OT [employer] Pager [phone] Office [phone]   Raymondo Band 02/05/2019, 1:47 PM

## 2019-02-05 NOTE — Progress Notes (Signed)
Dr. Karleen Hampshire paged regarding pt inability to void following foley removal at 0600. Order placed for in and out catheterization. Performed and yielding 350cc's of dark, yellow urine. Will continue to monitor.  Hiram Comber, RN 02/05/2019 3:47 PM

## 2019-02-05 NOTE — Progress Notes (Signed)
Initial Nutrition Assessment  DOCUMENTATION CODES:   Underweight  INTERVENTION:   Ensure Enlive po BID, each supplement provides 350 kcal and 20 grams of protein  Magic cup TID with meals, each supplement provides 290 kcal and 9 grams of protein  MVI daily  Dysphagia 3 diet   Recommend Oscal with D po BID  NUTRITION DIAGNOSIS:   Increased nutrient needs related to post-op healing as evidenced by increased estimated needs.  GOAL:   Patient will meet greater than or equal to 90% of their needs  MONITOR:   PO intake, Supplement acceptance, Labs, Weight trends, Skin, I & O's  REASON FOR ASSESSMENT:   Consult Assessment of nutrition requirement/status  ASSESSMENT:   85 y/o female with h/o dementia, DM, sickle cell anemia, CHF, multiple myeloma and recent COVID infection admitted s/p fall and found to have femur and elbow fractures now s/p femur fracture repair 1/29.  RD working remotely.  Unable to speak with patient r/t dementia. Suspect pt with poor appetite and oral intake at baseline as pt is underweight and has dementia. Pt with increased estimated needs r/t COVID 19 and hip fracture. RD will add supplements and MVI to help pt meet her estimated needs. Per chart, pt appears fairly weight stable at baseline.   Pt at high risk for malnutrition but unable to diagnose at this time as NFPE cannot be performed.   Medications reviewed and include: vitamin C, colace, lovenox, D3, zinc sulfate, cefazolin, LRS _0 /hr  Labs reviewed: Hgb 7.6(L), Hct 22.9(L)  Unable to complete Nutrition-Focused physical exam at this time as pt with COVID 19 and RD working remotely.   Diet Order:   Diet Order            Diet regular Room service appropriate? Yes; Fluid consistency: Thin  Diet effective now             EDUCATION NEEDS:   Not appropriate for education at this time  Skin:  Skin Assessment: Reviewed RN Assessment(ecchymosis, incision hip)  Last BM:  1/29  Height:    Ht Readings from Last 1 Encounters:  02/03/19 _1  (1.676 m)    Weight:   Wt Readings from Last 1 Encounters:  02/03/19 41 kg    Ideal Body Weight:  59 kg  BMI:  Body mass index is 14.59 kg/m.  Estimated Nutritional Needs:   Kcal:  1200-1400kcal/day  Protein:  60-70g/day  Fluid:  >1L/day  Koleen Distance MS, RD, LDN Pager #- [phone] Office#- [phone] After Hours Pager: [phone]

## 2019-02-05 NOTE — Progress Notes (Signed)
Subjective: 1 Day Post-Op Procedure(s) (LRB): INTRAMEDULLARY (IM) NAIL INTERTROCHANTRIC (Right) Patient reports minimal to no pain.  Pleasantly confused sitting up in bed.  Objective: Vital signs in last 24 hours: Temp:  [97 F (36.1 C)-99.4 F (37.4 C)] 97.6 F (36.4 C) (01/30 1201) Pulse Rate:  [93-124] 99 (01/30 1325) Resp:  [17-29] 20 (01/30 1325) BP: (87-119)/(47-75) 93/60 (01/30 1201) SpO2:  [89 %-99 %] 99 % (01/30 1325)  Intake/Output from previous day: 01/29 0701 - 01/30 0700 In: 2996.1 [I.V.:2446.1; IV Piggyback:550] Out: 1500 [Urine:1400; Blood:100] Intake/Output this shift: No intake/output data recorded.  Recent Labs    02/03/19 1026 02/03/19 1831 02/04/19 0421 02/04/19 1825 02/05/19 0352  HGB 13.0 12.3 10.8* 8.0* 7.6*   Recent Labs    02/04/19 1825 02/05/19 0352  WBC 10.5 8.3  RBC 2.52* 2.37*  HCT 24.4* 22.9*  PLT 249 231   Recent Labs    02/04/19 0421 02/04/19 0421 02/04/19 1825 02/05/19 0352  NA 137  --   --  135  K 4.4  --   --  3.8  CL 102  --   --  102  CO2 27  --   --  23  BUN 19  --   --  15  CREATININE 0.66   < > 0.88 0.75  GLUCOSE 114*  --   --  115*  CALCIUM 8.8*  --   --  8.3*   < > = values in this interval not displayed.   No results for input(s): LABPT, INR in the last 72 hours.  Neurovascular intact Sensation intact distally Intact pulses distally Dorsiflexion/Plantar flexion intact Incision: dressing C/D/I splint in place right arm Did have pain with PROM R hip very gaurded  Assessment/Plan: 1 Day Post-Op Procedure(s) (LRB): INTRAMEDULLARY (IM) NAIL INTERTROCHANTRIC (Right)  WBAT RLE NWB in splint RUE dsg change prn RLE lovenox for dvt proph Pain control as ordered but minimize opoid Plan for back to OR on Wed for ORIF R Olecranon fx with Dr. Griffin Basil    Anticipated LOS equal to or greater than 2 midnights due to - Age 83 and older with one or more of the following:  - Obesity  - Expected need for hospital  services (PT, OT, Nursing) required for safe  discharge  - Anticipated need for postoperative skilled nursing care or inpatient rehab  - Active co-morbidities: Dementia with hx of left post communicating artery aneurysm  Covid positive R hip and R olecranon fractures OR   - Unanticipated findings during/Post Surgery: Slow post-op progression: GI, pain control, mobility  - Patient is a high risk of re-admission due to: 2 or more ED visits within previous 6 months and Barriers to post-acute care (logistical, no family support in home)    Chriss Czar 02/05/2019, 1:27 PM

## 2019-02-06 DIAGNOSIS — R410 Disorientation, unspecified: Secondary | ICD-10-CM

## 2019-02-06 DIAGNOSIS — M6281 Muscle weakness (generalized): Secondary | ICD-10-CM

## 2019-02-06 DIAGNOSIS — Z7189 Other specified counseling: Secondary | ICD-10-CM

## 2019-02-06 DIAGNOSIS — Z515 Encounter for palliative care: Secondary | ICD-10-CM

## 2019-02-06 DIAGNOSIS — M25551 Pain in right hip: Secondary | ICD-10-CM

## 2019-02-06 LAB — CBC
HCT: 20.1 % — ABNORMAL LOW (ref 36.0–46.0)
Hemoglobin: 6.7 g/dL — CL (ref 12.0–15.0)
MCH: 32.1 pg (ref 26.0–34.0)
MCHC: 33.3 g/dL (ref 30.0–36.0)
MCV: 96.2 fL (ref 80.0–100.0)
Platelets: 295 10*3/uL (ref 150–400)
RBC: 2.09 MIL/uL — ABNORMAL LOW (ref 3.87–5.11)
RDW: 12.3 % (ref 11.5–15.5)
WBC: 8.1 10*3/uL (ref 4.0–10.5)
nRBC: 0 % (ref 0.0–0.2)

## 2019-02-06 LAB — BASIC METABOLIC PANEL
Anion gap: 10 (ref 5–15)
BUN: 12 mg/dL (ref 8–23)
CO2: 25 mmol/L (ref 22–32)
Calcium: 8.1 mg/dL — ABNORMAL LOW (ref 8.9–10.3)
Chloride: 102 mmol/L (ref 98–111)
Creatinine, Ser: 0.7 mg/dL (ref 0.44–1.00)
GFR calc Af Amer: >60 mL/min (ref >60)
GFR calc non Af Amer: >60 mL/min (ref >60)
Glucose, Bld: 91 mg/dL (ref 70–99)
Potassium: 3.4 mmol/L — ABNORMAL LOW (ref 3.5–5.1)
Sodium: 137 mmol/L (ref 135–145)

## 2019-02-06 LAB — PREPARE RBC (CROSSMATCH)

## 2019-02-06 LAB — MAGNESIUM: Magnesium: 2.1 mg/dL (ref 1.7–2.4)

## 2019-02-06 MED ORDER — ACETAMINOPHEN 325 MG PO TABS
650.0000 mg | ORAL_TABLET | Freq: Three times a day (TID) | ORAL | Status: DC
  Administered 2019-02-06 – 2019-02-14 (×20): 650 mg via ORAL
  Filled 2019-02-06 (×21): qty 2

## 2019-02-06 MED ORDER — POTASSIUM CHLORIDE CRYS ER 20 MEQ PO TBCR
40.0000 meq | EXTENDED_RELEASE_TABLET | Freq: Once | ORAL | Status: AC
  Administered 2019-02-06: 40 meq via ORAL
  Filled 2019-02-06: qty 2

## 2019-02-06 MED ORDER — SODIUM CHLORIDE 0.9% IV SOLUTION: Freq: Once | INTRAVENOUS | Status: DC

## 2019-02-06 MED ORDER — OXYCODONE HCL 5 MG PO TABS
2.5000 mg | ORAL_TABLET | ORAL | Status: DC | PRN
  Administered 2019-02-07 – 2019-02-11 (×6): 5 mg via ORAL
  Filled 2019-02-06 (×6): qty 1

## 2019-02-06 NOTE — TOC Initial Note (Addendum)
Transition of Care [facility]) - Initial/Assessment Note    Patient Details  Name: [name] MRN: [id number] Date of Birth: 11-04-1934  [facility]) CM/SW Contact:    Arvella Merles, LCSW Phone Number: 02/06/2019, 10:52 AM  Clinical Narrative:                 CSW received consult for possible SNF placement at time of discharge. Patient currently disoriented, CSW spoke with patient's daughter [name] regarding PT recommendation of SNF placement at time of discharge. Patient's daughter expressed she thought that would be the recommendation and she is agreeable to SNF placement at time of discharge. CSW discussed insurance authorization process and provided Medicare SNF ratings list. CSW informed patient's daughter of the facilities currently taking Covid+ patients. Patient's daughter expressed that her mother is due for another surgery prior to discharge and asked CSW an estimated time of when patient will be leaving. CSW informed her that the medical staff would be able to accurately provide a medical update on the patient. CSW informed patient's daughter that she will be updated on which facilities accept the patient, prior to moving forward discharging. SW to continue to follow and assist with discharge planning needs.   Expected Discharge Plan: Skilled Nursing Facility Barriers to Discharge: Continued Medical Work up   Patient Goals and CMS Choice   CMS Medicare.gov Compare Post Acute Care list provided to:: Patient Represenative (must comment)(Vicki, daughter) Choice offered to / list presented to : Adult Children  Expected Discharge Plan and Services Expected Discharge Plan: [place] arrangements for the past 2 months: Single Family Home                                      Prior Living Arrangements/Services Living arrangements for the past 2 months: Single Family Home Lives with:: Self Patient language and need for  interpreter reviewed:: Yes Do you feel safe going back to the place where you live?: Yes      Need for Family Participation in Patient Care: Yes (Comment) Care giver support system in place?: Yes (comment)   Criminal Activity/Legal Involvement Pertinent to Current Situation/Hospitalization: No - Comment as needed  Activities of Daily Living      Permission Sought/Granted Permission sought to share information with : Facility Sport and exercise psychologist, Family Supports    Share Information with NAME: [name]  Permission granted to share info w AGENCY: SNFs  Permission granted to share info w Relationship: Daughter  Permission granted to share info w Contact Information: [phone]  Emotional Assessment   Attitude/Demeanor/Rapport: Unable to Assess Affect (typically observed): Unable to Assess   Alcohol / Substance Use: Not Applicable Psych Involvement: No (comment)  Admission diagnosis:  Closed fracture of right hip, initial encounter ([place]) [S72.001A] Displaced segmental fracture of shaft of right femur, initial encounter for closed fracture [facility]) [S72.361A] Patient Active Problem List   Diagnosis Date Noted  . Displaced segmental fracture of shaft of right femur, initial encounter for closed fracture ([place]) 02/03/2019  . Dementia ([place]) 02/03/2019  . Cerebral aneurysm without rupture 01/23/2012    Class: Acute  . Distal radius fracture, left 09/05/2011   PCP:  [name], MD Pharmacy:   [id number] - [place], [place] - [address] [place] [address] Phone: [phone] Fax: [phone]     Social Determinants of  Health (SDOH) Interventions    Readmission Risk Interventions No flowsheet data found.

## 2019-02-06 NOTE — Consult Note (Addendum)
Consultation Note Date: 02/06/2019   Patient Name: [name]  DOB: 01-May-1934  MRN: [id number]  Age / Sex: 85 y.o., female  PCP: [name], MD Referring Physician: Hosie Poisson, MD  Reason for Consultation: Establishing goals of care  HPI/Patient Profile:  Per recent hospitalist note --> 32 year old lady prior history of dementia, cerebral artery aneurysm presented to ER with of right leg pain following a mechanical fall.  n arrival to ED she underwent x-rays showing a right intra-articular fracture and was given a posterior splint and discharged home.  She had another fall after returning home and sustained injury to the right leg.  X-rays of the right leg show acute fracture of the proximal right femur with lateral angulation just below the intertrochanteric region.Patient also tested positive for Covid on admission. Hospital course complicated by post op hypoxia and anemia of blood loss.   Clinical Assessment and Goals of Care: I have reviewed medical records including EPIC notes, [name] and imaging, received report from bedside RN, assessed the patient, she is pleasantly disoriented. She thinks that we are at a golf course as she is watching this on television. She denies pain, nausea, shortness of breath.    I called [name] (patients daughter) to further discuss diagnosis prognosis, GOC, EOL wishes, disposition and options.   I introduced Palliative Medicine as specialized medical care for people living with serious illness. It focuses on providing relief from the symptoms and stress of a serious illness. The goal is to improve quality of life for both the patient and the family.  [name] shared that her father had died on hospice. She said this it was overall a pleasing experience. [name] shared that her mother and fath, [name] were married for over 4 years. They share three children together  two of whom are deceased. I asked her how Valor was functioning at home prior to hospitalization. She said that she had been living alone able to for the most part attend to her basic daily needs. She endorsed that her changes were rather rapid. [name] noticed that intermittently her mother would get "off balance" leading to falls. She had apparently fallen at home multiple times prior to these recent injuries. [name] also notes that the patient was becoming more confused at home.   I asked her how she felt her mothers rehabilitation would go. [name] shared that she is hopeful that it will go well and her mother will participate. She and I discussed that if she does not participate then alternative discussions will need to occur which largely would circulate around potential enrollment in hospice. [name] said that she understood this. She is trying to take things one day at a time as her husband is also contending with caring for elderly parents. We discussed how stressful this can be. Provided emotional support through therapeutic listening.   Offered information on delirium for [name] as she did not understand why her mother is so altered during this hospital stay.   Asked [name] to start to consider alternative  living situations for her mother as her living at home alone is no longer safe. She stated that these were topics that were already resting on her mind.  We dicussed [place] code status. [name] said that she would not want her mothers life prolonged. She would want her to be DNR/DNI/ limited interventions, would want antibiotics and a trial period of IVF, no feeding tube. We were able to complete an electronic MOST form indicating patient wishes.   Discussed with patient the importance of continued conversation with family and their  medical providers regarding overall plan of care and treatment options, ensuring decisions are within the context of the patients values and GOCs.  Questions and  concerns addressed. [name] is amenable to outpatient Palliative care follow up.   Decision Maker: [name] (Daughter) [phone]   SUMMARY OF RECOMMENDATIONS   MOST completed in [facility]  OP Palliative care follow up  Chaplain Consult  Code Status/Advance Care Planning:  DNR  Management:  Goals of Care:                 - As above, MOST completed   Right Hip Pain: Right elbow pain:  - Tylenol 650mg  PO TID  - Will DC Robaxin   - Oxycodone 2.5-31mf PO Q4H PRN  - Dilaudid 0.5mg  IVP Q4H PRN for breakthrough pain  (short term use)                  Constipation:                 - Miralax 17g PO QDay PRN  - Senna 2 tabs PO BID   Muscular Weakness:                 - PT Eval                 - OT Eval  Urinary Retention:  - Agree with QShift bladder scans  - Could try prompted voiding  - If needed straight catheterize  Delirium:                 - Delirium precautions                 - Get up during the day                 - Encourage a familiar face to remain present throughout the day                 - Keep blinds open and lights on during daylight hours                 - Minimize the use of benzodiazepines  - Utilize opioids for short term unless already tolerant   Spiritual:                 - Chaplain consult  Palliative Prophylaxis:   Aspiration, Bowel Regimen, Delirium Protocol, Eye Care, Frequent Pain Assessment, Oral Care, Palliative Wound Care and Turn Reposition  Additional Recommendations (Limitations, Scope, Preferences):  Minimize Medications  Psycho-social/Spiritual:   Desire for further Chaplaincy support: Yes  Additional Recommendations: Caregiving  Support/Resources  Prognosis:   UTA, mortality s/p hip repair with complicated delirium is typically around three months but it will depend upon progress as rehab  Discharge Planning: [facility] for rehab with Palliative care service follow-up     Primary Diagnoses: Present on  Admission: . Distal radius fracture, left  I have reviewed the  medical record, interviewed the patient and family, and examined the patient. The following aspects are pertinent.  Past Medical History:  Diagnosis Date  . Double vision    started 1st part of December 2013-Has  a Cerebral Aneurysm  . High cholesterol   . Hyperlipidemia   . Kidney stone    Social History   Socioeconomic History  . Marital status: Widowed    Spouse name: Not on file  . Number of children: Not on file  . Years of education: Not on file  . Highest education level: Not on file  Occupational History  . Not on file  Tobacco Use  . Smoking status: Never Smoker  . Smokeless tobacco: Never Used  . Tobacco comment: quit over 20 years ago.  Substance and Sexual Activity  . Alcohol use: No  . Drug use: No  . Sexual activity: Not on file  Other Topics Concern  . Not on file  Social History Narrative  . Not on file   Social Determinants of Health   Financial Resource Strain:   . Difficulty of Paying Living Expenses: Not on file  Food Insecurity:   . Worried About Charity fundraiser in the Last Year: Not on file  . Ran Out of Food in the Last Year: Not on file  Transportation Needs:   . Lack of Transportation (Medical): Not on file  . Lack of Transportation (Non-Medical): Not on file  Physical Activity:   . Days of Exercise per Week: Not on file  . Minutes of Exercise per Session: Not on file  Stress:   . Feeling of Stress : Not on file  Social Connections:   . Frequency of Communication with Friends and Family: Not on file  . Frequency of Social Gatherings with Friends and Family: Not on file  . Attends Religious Services: Not on file  . Active Member of Clubs or Organizations: Not on file  . Attends Archivist Meetings: Not on file  . Marital Status: Not on file   Family History  Family history unknown: Yes   Scheduled Meds: . sodium chloride   Intravenous Once  .  acetaminophen  1,000 mg Oral Once  . vitamin C  500 mg Oral BID  . celecoxib  200 mg Oral Once  . chlorhexidine  60 mL Topical Once  . Chlorhexidine Gluconate Cloth  6 each Topical Daily  . enoxaparin (LOVENOX) injection  40 mg Subcutaneous Q24H  . feeding supplement (ENSURE ENLIVE)  237 mL Oral BID BM  . ferrous sulfate  325 mg Oral BID WC  . multivitamin with minerals  1 tablet Oral Daily  . mupirocin ointment  1 application Nasal BID  . povidone-iodine  2 application Topical Once  . senna-docusate  2 tablet Oral BID  . cholecalciferol  1,000 Units Oral Daily  . zinc sulfate  220 mg Oral Daily   Continuous Infusions: . [START ON 02/09/2019]  ceFAZolin (ANCEF) IV    . methocarbamol (ROBAXIN) IV     PRN Meds:.acetaminophen, bisacodyl, HYDROmorphone (DILAUDID) injection, lip balm, magnesium citrate, menthol-cetylpyridinium **OR** phenol, methocarbamol **OR** methocarbamol (ROBAXIN) IV, ondansetron **OR** ondansetron (ZOFRAN) IV, oxyCODONE, polyethylene glycol Medications Prior to Admission:  Prior to Admission medications   Not on File   No Known Allergies Review of Systems  Unable to perform ROS  Physical Exam Vitals and nursing note reviewed.  HENT:     Head: Normocephalic.     Nose: Nose normal.     Mouth/Throat:  Mouth: Mucous membranes are moist.  Eyes:     Pupils: Pupils are equal, round, and reactive to light.  Cardiovascular:     Rate and Rhythm: Tachycardia present.     Pulses: Normal pulses.  Pulmonary:     Effort: Pulmonary effort is normal.  Abdominal:     General: Abdomen is flat.  Musculoskeletal:        General: Normal range of motion.     Cervical back: Normal range of motion.  Skin:    General: Skin is warm and dry.     Capillary Refill: Capillary refill takes less than 2 seconds.  Neurological:     General: No focal deficit present.     Mental Status: She is alert.  Psychiatric:        Mood and Affect: Mood normal.    Vital Signs: BP 128/72    Pulse 99   Temp 98.5 F (36.9 C) (Oral)   Resp (!) 22   Ht 5\' 6"  (1.676 m)   Wt 41 kg   SpO2 96%   BMI 14.59 kg/m  Pain Scale: 0-10 POSS *See Group Information*: 1-Acceptable,Awake and alert Pain Score: 0-No pain  SpO2: SpO2: 96 % O2 Device:SpO2: 96 % O2 Flow Rate: .O2 Flow Rate (L/min): 2 L/min  IO: Intake/output summary:   Intake/Output Summary (Last 24 hours) at 02/06/2019 1536 Last data filed at 02/06/2019 1300 Gross per 24 hour  Intake 610 ml  Output --  Net 610 ml   LBM: Last BM Date: 02/04/19 Baseline Weight: Weight: 41 kg Most recent weight: Weight: 41 kg     Palliative Assessment/Data: 30%: Time In: 1520 Time Out: 1630 Time Total: 70 Greater than 50%  of this time was spent counseling and coordinating care related to the above assessment and plan.  Signed by: Rosezella Rumpf, NP   Please contact Palliative Medicine Team phone at [phone] for questions and concerns.  For individual provider: See Shea Evans

## 2019-02-06 NOTE — NC FL2 (Signed)
[place] LEVEL OF CARE SCREENING TOOL     IDENTIFICATION  Patient Name: [name] Birthdate: 02-26-1934 Sex: female Admission Date (Current Location): 02/03/2019  [facility] and [place] Number:  Engineering geologist and Address:  [facility], [place] [address], [place], [place] [address]      Provider Number: [id number]  Attending Physician Name and Address:  Hosie Poisson, MD  Relative Name and Phone Number:  [name] (daughter) [phone]    Current Level of Care: Hospital Recommended Level of Care: [place] Prior Approval Number:    Date Approved/Denied:   PASRR Number: [id number]  Discharge Plan: SNF    Current Diagnoses: Patient Active Problem List   Diagnosis Date Noted  . Displaced segmental fracture of shaft of right femur, initial encounter for closed fracture ([place]) 02/03/2019  . Dementia ([place]) 02/03/2019  . Cerebral aneurysm without rupture 01/23/2012  . Distal radius fracture, left 09/05/2011    Orientation RESPIRATION BLADDER Height & Weight     (disoriented x4)  Normal Incontinent, External catheter Weight: 90 lb 6.2 oz (41 kg) Height:  5\' 6"  (167.6 cm)  BEHAVIORAL SYMPTOMS/MOOD NEUROLOGICAL BOWEL NUTRITION STATUS      Incontinent Diet(see discharge summary)  AMBULATORY STATUS COMMUNICATION OF NEEDS Skin   Extensive Assist Verbally Other (Comment)(ecchymosis lip, closed surgical incision right hip)                       Personal Care Assistance Level of Assistance  Bathing, Dressing, Total care, Feeding Bathing Assistance: Maximum assistance Feeding assistance: Limited assistance Dressing Assistance: Maximum assistance Total Care Assistance: Maximum assistance   Functional Limitations Info  Sight, Hearing, Speech Sight Info: Adequate Hearing Info: Adequate Speech Info: Adequate    SPECIAL CARE FACTORS FREQUENCY  PT (By licensed PT), OT (By licensed OT)     PT Frequency: min 5x  weekly OT Frequency: min 5x weekly            Contractures Contractures Info: Not present    Additional Factors Info  Code Status, Allergies, Isolation Precautions Code Status Info: Full Allergies Info: No Known Allergies     Isolation Precautions Info: Air and Contact precautions, Covid 19     Current Medications (02/06/2019):  This is the current hospital active medication list Current Facility-Administered Medications  Medication Dose Route Frequency Provider Last Rate Last Admin  . 0.9 %  sodium chloride infusion (Manually program via Guardrails IV Fluids)   Intravenous Once Bodenheimer, Charles A, NP      . acetaminophen (TYLENOL) tablet 1,000 mg  1,000 mg Oral Once Duane Boston, MD      . acetaminophen (TYLENOL) tablet 650 mg  650 mg Oral Q4H PRN Hosie Poisson, MD      . ascorbic acid (VITAMIN C) tablet 500 mg  500 mg Oral BID Ethelda Chick, PA-C   500 mg at 02/05/19 0911  . bisacodyl (DULCOLAX) suppository 10 mg  10 mg Rectal Daily PRN McBane, Maylene Roes, PA-C      . [START ON 02/09/2019] ceFAZolin (ANCEF) IVPB 2g/100 mL premix  2 g Intravenous On Call to OR Lisette Abu, PA-C      . celecoxib (CELEBREX) capsule 200 mg  200 mg Oral Once Duane Boston, MD      . chlorhexidine (HIBICLENS) 4 % liquid 4 application  60 mL Topical Once Lisette Abu, PA-C      . Chlorhexidine Gluconate Cloth 2 % PADS  6 each  6 each Topical Daily Ethelda Chick, PA-C   6 each at 02/05/19 [id number]  . enoxaparin (LOVENOX) injection 40 mg  40 mg Subcutaneous Q24H Ethelda Chick, PA-C   40 mg at 02/06/19 0801  . feeding supplement (ENSURE ENLIVE) (ENSURE ENLIVE) liquid 237 mL  237 mL Oral BID BM Hosie Poisson, MD      . ferrous sulfate tablet 325 mg  325 mg Oral BID WC Hosie Poisson, MD   325 mg at 02/06/19 0801  . HYDROmorphone (DILAUDID) injection 0.5 mg  0.5 mg Intravenous Q4H PRN Ethelda Chick, PA-C   0.5 mg at 02/06/19 0555  . lactated ringers infusion   Intravenous  Continuous Ethelda Chick, PA-C 75 mL/hr at 02/06/19 [id number] New Bag at 02/06/19 0311  . lip balm (BLISTEX) ointment   Topical PRN Hosie Poisson, MD      . magnesium citrate solution 1 Bottle  1 Bottle Oral Once PRN McBane, Maylene Roes, PA-C      . menthol-cetylpyridinium (CEPACOL) lozenge 3 mg  1 lozenge Oral PRN McBane, Maylene Roes, PA-C       Or  . phenol (CHLORASEPTIC) mouth spray 1 spray  1 spray Mouth/Throat PRN McBane, Maylene Roes, PA-C      . methocarbamol (ROBAXIN) tablet 500 mg  500 mg Oral Q6H PRN McBane, Maylene Roes, PA-C       Or  . methocarbamol (ROBAXIN) 500 mg in dextrose 5 % 50 mL IVPB  500 mg Intravenous Q6H PRN McBane, Maylene Roes, PA-C      . multivitamin with minerals tablet 1 tablet  1 tablet Oral Daily Hosie Poisson, MD   1 tablet at 02/05/19 1159  . mupirocin ointment (BACTROBAN) 2 % 1 application  1 application Nasal BID Ethelda Chick, PA-C   1 application at 0000000 2151  . ondansetron (ZOFRAN) tablet 4 mg  4 mg Oral Q6H PRN McBane, Maylene Roes, PA-C       Or  . ondansetron (ZOFRAN) injection 4 mg  4 mg Intravenous Q6H PRN McBane, Maylene Roes, PA-C      . oxyCODONE (Oxy IR/ROXICODONE) immediate release tablet 5-10 mg  5-10 mg Oral Q4H PRN Ethelda Chick, PA-C   5 mg at 02/06/19 0801  . polyethylene glycol (MIRALAX / GLYCOLAX) packet 17 g  17 g Oral Daily PRN McBane, Caroline N, PA-C      . povidone-iodine 10 % swab 2 application  2 application Topical Once Lisette Abu, PA-C      . senna-docusate (Senokot-S) tablet 2 tablet  2 tablet Oral BID Hosie Poisson, MD      . Vitamin D3 (Vitamin D) tablet 1,000 Units  1,000 Units Oral Daily Ethelda Chick, PA-C   1,000 Units at 02/05/19 [id number]  . zinc sulfate capsule 220 mg  220 mg Oral Daily Ethelda Chick, PA-C   220 mg at 02/05/19 [id number]     Discharge Medications: Please see discharge summary for a list of discharge medications.  Relevant Imaging Results:  Relevant Lab Results:   Additional  Information SSN: [id number]  Alberteen Sam, LCSW

## 2019-02-06 NOTE — Progress Notes (Signed)
Subjective: 2 Days Post-Op Procedure(s) (LRB): INTRAMEDULLARY (IM) NAIL INTERTROCHANTRIC (Right) Patient with baseline confusion, not aware of where she was or the fact she had hip surgery.  Not c/o pain unless attempt to move the leg.    Objective: Vital signs in last 24 hours: Temp:  [97.6 F (36.4 C)-99.5 F (37.5 C)] 98.5 F (36.9 C) (01/31 0900) Pulse Rate:  [93-120] 111 (01/31 0900) Resp:  [17-24] 22 (01/31 0900) BP: (93-135)/(48-73) 135/61 (01/31 0900) SpO2:  [94 %-99 %] 99 % (01/31 0900)  Intake/Output from previous day: 01/30 0701 - 01/31 0700 In: 1083.8 [I.V.:883.8; IV Piggyback:200] Out: 350 [Urine:350] Intake/Output this shift: No intake/output data recorded.  Recent Labs    02/03/19 1831 02/04/19 0421 02/04/19 1825 02/05/19 0352 02/06/19 0140  HGB 12.3 10.8* 8.0* 7.6* 6.7*   Recent Labs    02/05/19 0352 02/05/19 1334 02/06/19 0140  WBC 8.3  --  8.1  RBC 2.37* 2.45* 2.09*  HCT 22.9*  --  20.1*  PLT 231  --  295   Recent Labs    02/05/19 0352 02/06/19 0140  NA 135 137  K 3.8 3.4*  CL 102 102  CO2 23 25  BUN 15 12  CREATININE 0.75 0.70  GLUCOSE 115* 91  CALCIUM 8.3* 8.1*   No results for input(s): LABPT, INR in the last 72 hours.  Incision: dressing C/D/I   Assessment/Plan: 2 Days Post-Op Procedure(s) (LRB): INTRAMEDULLARY (IM) NAIL INTERTROCHANTRIC (Right)  Acute blood loss anemia manage per med but likely need transfusion  WBAT RLE NWB in splint RUE dsg change prn RLE lovenox for dvt proph Pain control as ordered but minimize opoid Plan for back to OR on Wed for ORIF R Olecranon fx with Dr. Garrison [place] Janeil Schexnayder 02/06/2019, 10:20 AM

## 2019-02-06 NOTE — Progress Notes (Signed)
68fr foley inserted using sterile technique, immediate return of 400cc amber colored urine. Bulb inflated with 10cc saline, taped to right leg secured, drainage bag container low position hanging freely, patient tolerated well.

## 2019-02-06 NOTE — Progress Notes (Addendum)
PROGRESS NOTE    [name]  VEL:[id number] DOB: 1934/08/17 DOA: 02/03/2019 PCP: [name], MD    Brief Narrative:  85 year old lady prior history of dementia, cerebral artery aneurysm presented to ER with of right leg pain following a mechanical fall.  On arrival to ED she underwent x-rays showing a right intra-articular fracture and was given a posterior splint and discharged home.  She had another fall after returning home and sustained injury to the right leg.  X-rays of the right leg show acute fracture of the proximal right femur with lateral angulation just below the intertrochanteric region. Patient also tested positive for Covid on admission. Hospital course complicated by post op hypoxia and anemia of blood loss.   Assessment & Plan:   Principal Problem:   Displaced segmental fracture of shaft of right femur, initial encounter for closed fracture [facility]) Active Problems:   Distal radius fracture, left   Dementia ([place])   Right femur fracture: X -rays/CT of the hip on the right shows Unstable comminuted extracapsular right femur fracture with predominantly intertrochanteric extension but with complete separation of the greater and lesser trochanters from both the femoral neck and the subtrochanteric region  Orthopedics consulted and underwent  Right cephallomedullary nail placement on 02/04/19 by [name].  Plan for OR on Wed for ORIF R Olecranon fx with Dr. Griffin Basil. PT evaluation recommending SNF.  TOC consult placed and discussed with the daughter. The daughter reports there is limited facilities to choose from due to her covid positive status. She is requesting to get another covid test on Thursday or Friday after surgery .    Distal radius fracture on the right Splint placed. X-rays show Distracted intra-articular olecranon fracture with significant overlying soft tissue swelling. OR on Wed for ORIF R Olecranon fx with Dr. Griffin Basil Pain controlled.    COVID-19  positive No respiratory or  gastrointestinal symptoms. But post op pt was hypoxic and required about 2l it of [place] oxygen.  X-rays do not show acute infection. No indication of steroids.  Follow inflammatory markers.    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  Possibly secondary to hospital delirium.  Anemia of blood loss from surgery:  Baseline hemoglobin is between 10 to 12. Post op hemoglobin dropped to 8 and to 7.6 to 6.7.  Anemia panel shows low iron levels.  Iron supplementation added.  Transfuse to keep hemoglobin greater than 7.  S/p 1 unit of prbc transfusion. H&h tonight and tomorrow.  No obvious signs of bleeding.   POOR oral intake:  Get nutrition consult.   In view of her multiple medical issues, poor functional status, poor progression, multiple fractures, family would like to talk to palliative care for goals of care.    DVT prophylaxis: Lovenox Code Status: DNR.  Family Communication: None at bedside, spoke to daughter over the phone.  Disposition Plan:  . Patient came from: Home            . Anticipated d/c place: SNF . Barriers to d/c OR conditions which need to be met to effect a safe d/c: Pending surgery   Consultants:   Orthopedics  Procedures: Surgical repair of the femur Antimicrobials: None  Subjective: Patient is pleasantly confused does not appear to be agitated on 2 L of nasal cannula oxygen.   Objective: Vitals:   02/06/19 1200 02/06/19 1245  02/06/19 1259 02/06/19 1300  BP:  128/72 128/72 128/72  Pulse:  (!) 102  99  Resp:  (!) 24  (!) 22  Temp: 98.4 F (36.9 C) 98.5 F (36.9 C)    TempSrc: Oral Oral    SpO2:  96%    Weight:      Height:        Intake/Output Summary (Last 24 hours) at 02/06/2019 1416 Last data filed at 02/06/2019 1200 Gross per 24 hour  Intake 1473.75 ml    Output 350 ml  Net 1123.75 ml   Filed Weights   02/03/19 1757  Weight: 41 kg    Examination:  General exam: Alert and comfortable on 2 L of nasal cannula oxygen. Respiratory system: Decreased air entry at bases, no wheezing or rhonchi Cardiovascular system: S1-S2 heard, regular rate rhythm, no JVD, no pedal edema. Gastrointestinal system: Abdomen is soft, nontender, nondistended, bowel sounds normal Central nervous system: Alert, confused Extremities: Patient continues to have right hip tenderness right arm in the splint Skin: Rashes seen Psychiatry: pleasantly confused.     Data Reviewed: I have personally reviewed following labs and imaging studies  CBC: Recent Labs  Lab 02/03/19 1026 02/03/19 1026 02/03/19 1831 02/04/19 0421 02/04/19 1825 02/05/19 0352 02/06/19 0140  WBC 8.5   < > 9.5 8.9 10.5 8.3 8.1  NEUTROABS 6.9  --   --   --   --   --   --   HGB 13.0   < > 12.3 10.8* 8.0* 7.6* 6.7*  HCT 41.0   < > 38.6 34.0* 24.4* 22.9* 20.1*  MCV 98.3   < > 100.0 98.6 96.8 96.6 96.2  PLT 298   < > 316 282 249 231 295   < > = values in this interval not displayed.   Basic Metabolic Panel: Recent Labs  Lab 02/03/19 1026 02/03/19 1026 02/03/19 1831 02/04/19 0421 02/04/19 1825 02/05/19 0352 02/06/19 0140  NA 138  --  136 137  --  135 137  K 4.2  --  4.0 4.4  --  3.8 3.4*  CL 101  --  101 102  --  102 102  CO2 29  --  26 27  --  23 25  GLUCOSE 116*  --  129* 114*  --  115* 91  BUN 19  --  18 19  --  15 12  CREATININE 0.71   < > 0.76 0.66 0.88 0.75 0.70  CALCIUM 9.1  --  9.0 8.8*  --  8.3* 8.1*   < > = values in this interval not displayed.   GFR: Estimated Creatinine Clearance: 35.1 mL/min (by C-G formula based on SCr of 0.7 mg/dL). Liver Function Tests: Recent Labs  Lab 02/03/19 1026  AST 30  ALT 18  ALKPHOS 80  BILITOT 0.9  PROT 7.1  ALBUMIN 3.8   No results for input(s): LIPASE, AMYLASE in the last 168 hours. No results for input(s): AMMONIA in the  last 168 hours. Coagulation Profile: No results for input(s): INR, PROTIME in the last 168 hours. Cardiac Enzymes: Recent Labs  Lab 02/03/19 1026  CKTOTAL 154   BNP (last 3 results) No results for input(s): PROBNP in the last 8760 hours. HbA1C: No results for input(s): HGBA1C in the last 72 hours. CBG: No results for input(s): GLUCAP in the last 168 hours. Lipid Profile: No results for input(s): CHOL, HDL, LDLCALC, TRIG, CHOLHDL, LDLDIRECT in the last 72 hours. Thyroid Function Tests: No results for input(s):  TSH, T4TOTAL, FREET4, T3FREE, THYROIDAB in the last 72 hours. Anemia Panel: Recent Labs    02/05/19 1334  VITAMINB12 194  FOLATE 9.6  FERRITIN 170  TIBC 220*  IRON 18*  RETICCTPCT 1.9   Sepsis Labs: No results for input(s): PROCALCITON, LATICACIDVEN in the last 168 hours.  Recent Results (from the past 240 hour(s))  Respiratory Panel by RT PCR (Flu A&B, Covid) - Nasopharyngeal Swab     Status: Abnormal   Collection Time: 02/03/19  7:19 PM   Specimen: Nasopharyngeal Swab  Result Value Ref Range Status   SARS Coronavirus 2 by RT PCR POSITIVE (A) NEGATIVE Final    Comment: RESULT CALLED TO, READ BACK BY AND VERIFIED WITH: T WALKER,RN _0  02/03/19 MKELLY (NOTE) SARS-CoV-2 target nucleic acids are DETECTED. SARS-CoV-2 RNA is generally detectable in upper respiratory specimens  during the acute phase of infection. Positive results are indicative of the presence of the identified virus, but do not rule out bacterial infection or co-infection with other pathogens not detected by the test. Clinical correlation with patient history and other diagnostic information is necessary to determine patient infection status. The expected result is Negative. Fact Sheet for Patients:  PinkCheek.be Fact Sheet for Healthcare Providers: GravelBags.it This test is not yet approved or cleared by the [place] FDA and  has  been authorized for detection and/or diagnosis of SARS-CoV-2 by FDA under an Emergency Use Authorization (EUA).  This EUA will remain in effect (meaning this test can be used) for  the duration of  the COVID-19 declaration under Section 564(b)(1) of the Act, 21 U.S.C. section 360bbb-3(b)(1), unless the authorization is terminated or revoked sooner.    Influenza A by PCR NEGATIVE NEGATIVE Final   Influenza B by PCR NEGATIVE NEGATIVE Final    Comment: (NOTE) The Xpert Xpress SARS-CoV-2/FLU/RSV assay is intended as an aid in  the diagnosis of influenza from Nasopharyngeal swab specimens and  should not be used as a sole basis for treatment. Nasal washings and  aspirates are unacceptable for Xpert Xpress SARS-CoV-2/FLU/RSV  testing. Fact Sheet for Patients: PinkCheek.be Fact Sheet for Healthcare Providers: GravelBags.it This test is not yet approved or cleared by the [place] FDA and  has been authorized for detection and/or diagnosis of SARS-CoV-2 by  FDA under an Emergency Use Authorization (EUA). This EUA will remain  in effect (meaning this test can be used) for the duration of the  Covid-19 declaration under Section 564(b)(1) of the Act, 21  U.S.C. section 360bbb-3(b)(1), unless the authorization is  terminated or revoked. Performed at [facility], [address], [place], [place] [id number]   Surgical PCR screen     Status: None   Collection Time: 02/04/19  1:12 PM   Specimen: Nasal Mucosa; Nasal Swab  Result Value Ref Range Status   MRSA, PCR NEGATIVE NEGATIVE Final   Staphylococcus aureus NEGATIVE NEGATIVE Final    Comment: (NOTE) The Xpert SA Assay (FDA approved for NASAL specimens in patients 31 years of age and older), is one component of a comprehensive surveillance program. It is not intended to diagnose infection nor to guide or monitor treatment. Performed at [facility], [place] [address]  [place], [place] [address]          Radiology Studies: DG CHEST PORT 1 VIEW  Result Date: 02/04/2019 CLINICAL DATA:  Oxygen desaturation. EXAM: PORTABLE CHEST 1 VIEW COMPARISON:  February 03, 2019 FINDINGS: The heart, hila, and mediastinum are normal. No pneumothorax. No nodules or masses.  No focal infiltrates. IMPRESSION: No active disease. Electronically Signed   By: Dorise Bullion III M.D   On: 02/04/2019 19:52   DG C-Arm 1-60 Min  Result Date: 02/04/2019 CLINICAL DATA:  Femur fracture fixation EXAM: DG C-ARM 1-60 MIN; RIGHT FEMUR 2 VIEWS FLUOROSCOPY TIME:  Fluoroscopy Time:  1 minutes 50 seconds Number of Acquired Spot Images: 4 COMPARISON:  None. FINDINGS: Intraoperative radiographs demonstrate trochanteric femoral nail and intramedullary rod fixation proximal right femur shaft fracture. There are 2 distal interlocking screws. Alignment appears improved. IMPRESSION: Fluoroscopic guidance for fixation of right femur fracture with improved alignment Electronically Signed   By: Macy Mis M.D.   On: 02/04/2019 16:51   DG FEMUR, MIN 2 VIEWS RIGHT  Result Date: 02/04/2019 CLINICAL DATA:  Femur fracture fixation EXAM: DG C-ARM 1-60 MIN; RIGHT FEMUR 2 VIEWS FLUOROSCOPY TIME:  Fluoroscopy Time:  1 minutes 50 seconds Number of Acquired Spot Images: 4 COMPARISON:  None. FINDINGS: Intraoperative radiographs demonstrate trochanteric femoral nail and intramedullary rod fixation proximal right femur shaft fracture. There are 2 distal interlocking screws. Alignment appears improved. IMPRESSION: Fluoroscopic guidance for fixation of right femur fracture with improved alignment Electronically Signed   By: Macy Mis M.D.   On: 02/04/2019 16:51   DG FEMUR PORT, MIN 2 VIEWS RIGHT  Result Date: 02/04/2019 CLINICAL DATA:  Postoperative evaluation. EXAM: RIGHT FEMUR PORTABLE 2 VIEW COMPARISON:  None. FINDINGS: A radiopaque intramedullary rod is seen along the length of the right femur. A radiopaque  compression screw device is noted within the right femoral head and neck. An inter trochanteric fracture of the proximal right femur is seen with gross anatomic alignment. Soft tissues are unremarkable. IMPRESSION: Status post open reduction and internal fixation of the proximal right femur. Electronically Signed   By: Virgina Norfolk M.D.   On: 02/04/2019 18:07        Scheduled Meds: . sodium chloride   Intravenous Once  . acetaminophen  1,000 mg Oral Once  . vitamin C  500 mg Oral BID  . celecoxib  200 mg Oral Once  . chlorhexidine  60 mL Topical Once  . Chlorhexidine Gluconate Cloth  6 each Topical Daily  . enoxaparin (LOVENOX) injection  40 mg Subcutaneous Q24H  . feeding supplement (ENSURE ENLIVE)  237 mL Oral BID BM  . ferrous sulfate  325 mg Oral BID WC  . multivitamin with minerals  1 tablet Oral Daily  . mupirocin ointment  1 application Nasal BID  . povidone-iodine  2 application Topical Once  . senna-docusate  2 tablet Oral BID  . cholecalciferol  1,000 Units Oral Daily  . zinc sulfate  220 mg Oral Daily   Continuous Infusions: . [START ON 02/09/2019]  ceFAZolin (ANCEF) IV    . lactated ringers 75 mL/hr at 02/06/19 0311  . methocarbamol (ROBAXIN) IV       LOS: 3 days        Hosie Poisson, MD [facility]   To contact the attending provider between 7A-7P or the covering provider during after hours 7P-7A, please log into the web site [email or web address] and access using universal [place] password for that web site. If you do not have the password, please call the hospital operator.  02/06/2019, 2:16 PM

## 2019-02-06 NOTE — Progress Notes (Signed)
CRITICAL VALUE ALERT  Critical Value:  Hemoglobin = 6.7  Date & Time Notied: 02/06/2019 at 0430  Provider Notified:APP [place]   Orders Received/Actions taken:

## 2019-02-06 NOTE — Plan of Care (Signed)

## 2019-02-07 ENCOUNTER — Inpatient Hospital Stay (HOSPITAL_COMMUNITY): Payer: MEDICARE

## 2019-02-07 ENCOUNTER — Encounter: Payer: Self-pay | Admitting: *Deleted

## 2019-02-07 LAB — BASIC METABOLIC PANEL
Anion gap: 8 (ref 5–15)
BUN: 19 mg/dL (ref 8–23)
CO2: 26 mmol/L (ref 22–32)
Calcium: 8.2 mg/dL — ABNORMAL LOW (ref 8.9–10.3)
Chloride: 104 mmol/L (ref 98–111)
Creatinine, Ser: 0.5 mg/dL (ref 0.44–1.00)
GFR calc Af Amer: >60 mL/min (ref >60)
GFR calc non Af Amer: >60 mL/min (ref >60)
Glucose, Bld: 116 mg/dL — ABNORMAL HIGH (ref 70–99)
Potassium: 3.7 mmol/L (ref 3.5–5.1)
Sodium: 138 mmol/L (ref 135–145)

## 2019-02-07 LAB — BPAM RBC
Blood Product Expiration Date: 202102012359
ISSUE DATE / TIME: 202101310836
Unit Type and Rh: 9500

## 2019-02-07 LAB — TYPE AND SCREEN
ABO/RH(D): B NEG
Antibody Screen: NEGATIVE
Unit division: 0

## 2019-02-07 LAB — CBC
HCT: 25.6 % — ABNORMAL LOW (ref 36.0–46.0)
Hemoglobin: 8.7 g/dL — ABNORMAL LOW (ref 12.0–15.0)
MCH: 32.2 pg (ref 26.0–34.0)
MCHC: 34 g/dL (ref 30.0–36.0)
MCV: 94.8 fL (ref 80.0–100.0)
Platelets: 307 10*3/uL (ref 150–400)
RBC: 2.7 MIL/uL — ABNORMAL LOW (ref 3.87–5.11)
RDW: 13.2 % (ref 11.5–15.5)
WBC: 8.5 10*3/uL (ref 4.0–10.5)
nRBC: 0.7 % — ABNORMAL HIGH (ref 0.0–0.2)

## 2019-02-07 MED ORDER — CEFAZOLIN SODIUM-DEXTROSE 2-4 GM/100ML-% IV SOLN: 2.0000 g | INTRAVENOUS | Status: AC

## 2019-02-07 MED ORDER — SODIUM CHLORIDE 0.9 % IV BOLUS
500.0000 mL | Freq: Once | INTRAVENOUS | Status: AC
  Administered 2019-02-07: 23:00:00 500 mL via INTRAVENOUS

## 2019-02-07 NOTE — Progress Notes (Signed)
Occupational Therapy Treatment Patient Details Name: BITTANY PIERSMA MRN: [id number] DOB: 1934-10-18 Today's Date: 02/07/2019    History of present illness Pt adm after fall with displaced segmental fracture of the rt femur. Pt found to be Covid+. Pt had fall earlier in the day suffering a displaced olecranon fx on the rt and was splinted and dc'd home from the ED before suffering second fall. Pt underwent ORIF of rt femur on 1/29. Pt to have operative repair of rt olecranon at later date. PMH - dementia, craniotomy for aneurysm clipping.   OT comments  Pt making progress towards OT goals; continues to have limitations mostly due to pain in R hip, decreased sitting/standing balance and overall weakness. Pt pleasantly confused during session, with notable pain in RLE during transitions/movements but responds well to distractions. She was able to tolerate OOB to recliner via squat pivot with two person assist. Prior to transfer pt seated EOB approx 20 min with minguard-minA, completing simple grooming ADL with modA for task completion. Feel pt remains appropriate for SNF level therapies at time of discharge. Will continue to follow acutely.   Follow Up Recommendations  SNF;Supervision/Assistance - 24 hour    Equipment Recommendations  Other (comment)(defer to next venue)          Precautions / Restrictions Precautions Precautions: Fall Precaution Comments: x2 falls at home PTA Restrictions Weight Bearing Restrictions: Yes RUE Weight Bearing: Non weight bearing RLE Weight Bearing: Weight bearing as tolerated       Mobility Bed Mobility Overal bed mobility: Needs Assistance Bed Mobility: Rolling;Supine to Sit;Sit to Supine Rolling: Max assist;+2 for physical assistance;+2 for safety/equipment;Total assist   Supine to sit: Max assist;Mod assist     General bed mobility comments: pt seated EOB with PT upon arrival  Transfers Overall transfer level: Needs assistance Equipment  used: 2 person hand held assist Transfers: Sit to/from [employer] Sit to Stand: Total assist;+2 physical assistance;From elevated surface   Squat pivot transfers: Total assist;+2 physical assistance;From elevated surface     General transfer comment: Stood with +2 total using bed pad under hips and able to squat pivot pt to chair.     Balance Overall balance assessment: Needs assistance;History of Falls Sitting-balance support: Feet supported;Single extremity supported Sitting balance-Leahy Scale: Fair Sitting balance - Comments: minguard-minA for static sitting balance sitting 20 min and pt combed hair as well as did some LE exercise. Tends to lean right, requires max multimodal cues to correct and only able to maintain midline for short periods of time  Postural control: Right lateral lean;Posterior lean Standing balance support: Bilateral upper extremity supported Standing balance-Leahy Scale: Zero                             ADL either performed or assessed with clinical judgement   ADL Overall ADL's : Needs assistance/impaired Eating/Feeding: Supervision/ safety;Set up;Sitting Eating/Feeding Details (indicate cue type and reason): required max cues to initate picking up Ensure from table and drinking, but once initiated able to complete task without assist  Grooming: Minimal assistance;Moderate assistance;Brushing hair;Sitting Grooming Details (indicate cue type and reason): seated EOB, able to perform part of task on her own but requiring assist for full completion                              Functional mobility during ADLs: Maximal assistance;Total assistance;+2 for physical assistance;+2 for  safety/equipment(squat pivot transfer) General ADL Comments: pt continues to have limitations due to pain, weakness, decreased sitting/standing balance                       Cognition Arousal/Alertness: Awake/alert Behavior During  Therapy: WFL for tasks assessed/performed Overall Cognitive Status: History of cognitive impairments - at baseline                                 General Comments: pt with history of dementia, pleasantly confused during session, oriented to name and able to state DOB, required frequent reminders about her fall/hip injury         Exercises Exercises: General Lower Extremity General Exercises - Lower Extremity Long Arc Quad: AAROM;Both;5 reps;Seated Hip ABduction/ADduction: AAROM;Both;5 reps;Supine   Shoulder Instructions       General Comments BP 94/57 (68) end of session while seated in recliner; SpO2 >90% RA throughout session, HR 103-128 bpm    Pertinent Vitals/ Pain       Pain Assessment: Faces Faces Pain Scale: Hurts whole lot Pain Location: R hip with certain movements Pain Descriptors / Indicators: Discomfort;Grimacing;Crying;Sore Pain Intervention(s): Limited activity within patient's tolerance;Monitored during session;Repositioned;RN gave pain meds during session  Home Living                                          Prior Functioning/Environment              Frequency  Min 2X/week        Progress Toward Goals  OT Goals(current goals can now be found in the care plan section)  Progress towards OT goals: Progressing toward goals  Acute Rehab OT Goals Patient Stated Goal: Pt didn't state, agreeable to moving with therapies OT Goal Formulation: With patient Time For Goal Achievement: 02/19/19 Potential to Achieve Goals: Good  Plan Discharge plan remains appropriate    Co-evaluation    PT/OT/SLP Co-Evaluation/Treatment: Yes Reason for Co-Treatment: Complexity of the patient's impairments (multi-system involvement);For patient/therapist safety;To address functional/ADL transfers PT goals addressed during session: Mobility/safety with mobility OT goals addressed during session: ADL's and self-care      AM-PAC OT "6  Clicks" Daily Activity     Outcome Measure   Help from another person eating meals?: A Little Help from another person taking care of personal grooming?: A Lot Help from another person toileting, which includes using toliet, bedpan, or urinal?: Total Help from another person bathing (including washing, rinsing, drying)?: A Lot Help from another person to put on and taking off regular upper body clothing?: A Lot Help from another person to put on and taking off regular lower body clothing?: Total 6 Click Score: 11    End of Session    OT Visit Diagnosis: Other abnormalities of gait and mobility (R26.89);Pain;Other symptoms and signs involving cognitive function Pain - Right/Left: Right Pain - part of body: Hip;Leg   Activity Tolerance Patient tolerated treatment well;Patient limited by pain   Patient Left in chair;with call bell/phone within reach   Nurse Communication Mobility status        Time: [id number] OT Time Calculation (min): 23 min  Charges: OT General Charges $OT Visit: 1 Visit OT Treatments $Self Care/Home Management : 8-22 mins  Lou Cal, [place] Pager [phone] Office [phone]  Raymondo Band 02/07/2019, 1:35 PM

## 2019-02-07 NOTE — Progress Notes (Signed)
PROGRESS NOTE    [name]  YIR:[id number] DOB: 02-12-1934 DOA: 02/03/2019 PCP: [name], MD    Brief Narrative:  85 year old lady prior history of dementia, cerebral artery aneurysm presented to ER with of right leg pain following a mechanical fall.  On arrival to ED she underwent x-rays showing a right intra-articular fracture and was given a posterior splint and discharged home.  She had another fall after returning home and sustained injury to the right leg.  X-rays of the right leg show acute fracture of the proximal right femur with lateral angulation just below the intertrochanteric region. Patient also tested positive for Covid on admission. Hospital course complicated by post op hypoxia and anemia of blood loss.   Assessment & Plan:   Principal Problem:   Displaced segmental fracture of shaft of right femur, initial encounter for closed fracture [facility]) Active Problems:   Distal radius fracture, left   Dementia ([name])   Palliative care by specialist   Goals of care, counseling/discussion   Right hip pain   Muscular weakness   Delirium   Right femur fracture: X -rays/CT of the hip on the right shows Unstable comminuted extracapsular right femur fracture with predominantly intertrochanteric extension but with complete separation of the greater and lesser trochanters from both the femoral neck and the subtrochanteric region  Orthopedics consulted and underwent  Right cephallomedullary nail placement on 02/04/19 by [name].  Plan for OR on Wed for ORIF R Olecranon fx with Dr. Griffin Basil. PT evaluation recommending SNF.  TOC consult placed and discussed with the daughter. The daughter reports there is limited facilities to choose from due to her covid positive status. She is requesting to get another covid test on Thursday or Friday after surgery .  Pain control and she is working with PT and is up in the chair.   Distal radius fracture on the right Splint placed. X-rays  show Distracted intra-articular olecranon fracture with significant overlying soft tissue swelling. OR on Wed for ORIF R Olecranon fx with Dr. Griffin Basil Pain control   COVID-19 positive No respiratory or  gastrointestinal symptoms. But post op pt was hypoxic and required about 2l it of [place] oxygen.  But we were able to wean her off oxygen and her sats remained greater than 90%.  Chest x-ray do not show acute infection. No indication of steroids or remdesivir.  Follow inflammatory markers. 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 No new complaints     Dementia Pleasantly confused.  No agitation this morning    Anemia of blood loss from surgery:  Baseline hemoglobin is between 10 to 12. Post op hemoglobin dropped to 8 and to 7.6 to 6.7.  Anemia panel shows low iron levels.  Iron supplementation added.  Transfuse to keep hemoglobin greater than 7.  S/p 1 unit of prbc transfusion.  Repeat hemoglobin at 8.7 No obvious signs of bleeding.   POOR oral intake:  Get nutrition consult.      In view of her multiple medical issues, poor functional status, poor progression, multiple fractures, family would like to talk to palliative care for goals of care.    DVT prophylaxis: Lovenox Code Status: DNR.  Family Communication: None at bedside, spoke to daughter over the phone.  On 02/06/2019 Disposition Plan:  . Patient came from: Home            . Anticipated d/c place:  SNF . Barriers to d/c OR conditions which need to be met to effect a safe d/c: Pending surgery   Consultants:   Orthopedics  Procedures: Surgical repair of the femur Antimicrobials: None  Subjective: Patient is weaned off oxygen, pleasantly confused, denies any new complaints.   Objective: Vitals:   02/06/19 1300 02/06/19 2000 02/06/19 2002 02/06/19 2100  BP: 128/72 117/88  118/61  Pulse: 99   (!) 102    Resp: (!) 22 (!) 24 20 (!) 25  Temp:    98.7 F (37.1 C)  TempSrc:    Oral  SpO2:    (!) 72%  Weight:      Height:        Intake/Output Summary (Last 24 hours) at 02/07/2019 1824 Last data filed at 02/07/2019 1400 Gross per 24 hour  Intake 500 ml  Output 1050 ml  Net -550 ml   Filed Weights   02/03/19 1757  Weight: 41 kg    Examination:  General exam: Alert and comfortable on room air sitting up in the chair Respiratory system: Air entry fair bilateral, no wheezing or rhonchi Cardiovascular system: S1-S2 heard, regular rate rhythm, no JVD, no pedal edema. Gastrointestinal system: Abdomen is soft, nontender, nondistended, bowel sounds normal Central nervous system: She is alert and pleasantly confused able to move all extremities Extremities: Continues to have painful range of movements on the right lower extremity Skin: No rashes seen Psychiatry: pleasantly confused.     Data Reviewed: I have personally reviewed following labs and imaging studies  CBC: Recent Labs  Lab 02/03/19 1026 02/03/19 1831 02/04/19 0421 02/04/19 1825 02/05/19 0352 02/06/19 0140 02/07/19 0222  WBC 8.5   < > 8.9 10.5 8.3 8.1 8.5  NEUTROABS 6.9  --   --   --   --   --   --   HGB 13.0   < > 10.8* 8.0* 7.6* 6.7* 8.7*  HCT 41.0   < > 34.0* 24.4* 22.9* 20.1* 25.6*  MCV 98.3   < > 98.6 96.8 96.6 96.2 94.8  PLT 298   < > 282 249 231 295 307   < > = values in this interval not displayed.   Basic Metabolic Panel: Recent Labs  Lab 02/03/19 1831 02/03/19 1831 02/04/19 0421 02/04/19 1825 02/05/19 0352 02/06/19 0140 02/06/19 0614 02/07/19 0222  NA 136  --  137  --  135 137  --  138  K 4.0  --  4.4  --  3.8 3.4*  --  3.7  CL 101  --  102  --  102 102  --  104  CO2 26  --  27  --  23 25  --  26  GLUCOSE 129*  --  114*  --  115* 91  --  116*  BUN 18  --  19  --  15 12  --  19  CREATININE 0.76   < > 0.66 0.88 0.75 0.70  --  0.50  CALCIUM 9.0  --  8.8*  --  8.3* 8.1*  --  8.2*  MG  --   --    --   --   --   --  2.1  --    < > = values in this interval not displayed.   GFR: Estimated Creatinine Clearance: 35.1 mL/min (by C-G formula based on SCr of 0.5 mg/dL). Liver Function Tests: Recent Labs  Lab 02/03/19 1026  AST 30  ALT 18  ALKPHOS 80  BILITOT  0.9  PROT 7.1  ALBUMIN 3.8   No results for input(s): LIPASE, AMYLASE in the last 168 hours. No results for input(s): AMMONIA in the last 168 hours. Coagulation Profile: No results for input(s): INR, PROTIME in the last 168 hours. Cardiac Enzymes: Recent Labs  Lab 02/03/19 1026  CKTOTAL 154   BNP (last 3 results) No results for input(s): PROBNP in the last 8760 hours. HbA1C: No results for input(s): HGBA1C in the last 72 hours. CBG: No results for input(s): GLUCAP in the last 168 hours. Lipid Profile: No results for input(s): CHOL, HDL, LDLCALC, TRIG, CHOLHDL, LDLDIRECT in the last 72 hours. Thyroid Function Tests: No results for input(s): TSH, T4TOTAL, FREET4, T3FREE, THYROIDAB in the last 72 hours. Anemia Panel: Recent Labs    02/05/19 1334  VITAMINB12 194  FOLATE 9.6  FERRITIN 170  TIBC 220*  IRON 18*  RETICCTPCT 1.9   Sepsis Labs: No results for input(s): PROCALCITON, LATICACIDVEN in the last 168 hours.  Recent Results (from the past 240 hour(s))  Respiratory Panel by RT PCR (Flu A&B, Covid) - Nasopharyngeal Swab     Status: Abnormal   Collection Time: 02/03/19  7:19 PM   Specimen: Nasopharyngeal Swab  Result Value Ref Range Status   SARS Coronavirus 2 by RT PCR POSITIVE (A) NEGATIVE Final    Comment: RESULT CALLED TO, READ BACK BY AND VERIFIED WITH: T WALKER,RN '@2058'$  02/03/19 MKELLY (NOTE) SARS-CoV-2 target nucleic acids are DETECTED. SARS-CoV-2 RNA is generally detectable in upper respiratory specimens  during the acute phase of infection. Positive results are indicative of the presence of the identified virus, but do not rule out bacterial infection or co-infection with other pathogens  not detected by the test. Clinical correlation with patient history and other diagnostic information is necessary to determine patient infection status. The expected result is Negative. Fact Sheet for Patients:  PinkCheek.be Fact Sheet for Healthcare Providers: GravelBags.it This test is not yet approved or cleared by the [place] FDA and  has been authorized for detection and/or diagnosis of SARS-CoV-2 by FDA under an Emergency Use Authorization (EUA).  This EUA will remain in effect (meaning this test can be used) for  the duration of  the COVID-19 declaration under Section 564(b)(1) of the Act, 21 U.S.C. section 360bbb-3(b)(1), unless the authorization is terminated or revoked sooner.    Influenza A by PCR NEGATIVE NEGATIVE Final   Influenza B by PCR NEGATIVE NEGATIVE Final    Comment: (NOTE) The Xpert Xpress SARS-CoV-2/FLU/RSV assay is intended as an aid in  the diagnosis of influenza from Nasopharyngeal swab specimens and  should not be used as a sole basis for treatment. Nasal washings and  aspirates are unacceptable for Xpert Xpress SARS-CoV-2/FLU/RSV  testing. Fact Sheet for Patients: PinkCheek.be Fact Sheet for Healthcare Providers: GravelBags.it This test is not yet approved or cleared by the [place] FDA and  has been authorized for detection and/or diagnosis of SARS-CoV-2 by  FDA under an Emergency Use Authorization (EUA). This EUA will remain  in effect (meaning this test can be used) for the duration of the  Covid-19 declaration under Section 564(b)(1) of the Act, 21  U.S.C. section 360bbb-3(b)(1), unless the authorization is  terminated or revoked. Performed at [facility], [address], [place], [place] [address]   Surgical PCR screen     Status: None   Collection Time: 02/04/19  1:12 PM   Specimen: Nasal Mucosa; Nasal Swab  Result Value  Ref Range Status   MRSA,  PCR NEGATIVE NEGATIVE Final   Staphylococcus aureus NEGATIVE NEGATIVE Final    Comment: (NOTE) The Xpert SA Assay (FDA approved for NASAL specimens in patients 47 years of age and older), is one component of a comprehensive surveillance program. It is not intended to diagnose infection nor to guide or monitor treatment. Performed at [facility], [place] [address], [place], [place] [address]          Radiology Studies: CT ELBOW RIGHT WO CONTRAST  Result Date: 02/07/2019 CLINICAL DATA:  Right elbow pain status post fall, preop 02/09/2019. EXAM: CT OF THE UPPER RIGHT EXTREMITY WITHOUT CONTRAST TECHNIQUE: Multidetector CT imaging of the upper right extremity was performed according to the standard protocol. COMPARISON:  None. FINDINGS: Bones/Joint/Cartilage Limited evaluation secondary to positioning. Comminuted fracture of the olecranon with 2 cm of distraction. Mild cortical irregularity along the posterior aspect of the capitellum concerning for small marginal osteophyte versus a subtle nondisplaced fracture (image 31/series 4). Large joint effusion. No other acute fracture or dislocation. No aggressive osseous lesion. Ligaments Suboptimally assessed by CT. Muscles and Tendons Muscles are normal. No muscle atrophy. Biceps tendon is intact. Triceps tendon is intact. Soft tissues No fluid collection or hematoma. IMPRESSION: Comminuted fracture of the olecranon with 2 cm of distraction. Mild cortical irregularity along the posterior aspect of the capitellum concerning for small marginal osteophyte versus a subtle nondisplaced fracture (image 31/series 4). Electronically Signed   By: Kathreen Devoid   On: 02/07/2019 16:39        Scheduled Meds: . acetaminophen  650 mg Oral TID  . vitamin C  500 mg Oral BID  . Chlorhexidine Gluconate Cloth  6 each Topical Daily  . enoxaparin (LOVENOX) injection  40 mg Subcutaneous Q24H  . feeding supplement (ENSURE ENLIVE)  237 mL  Oral BID BM  . ferrous sulfate  325 mg Oral BID WC  . multivitamin with minerals  1 tablet Oral Daily  . mupirocin ointment  1 application Nasal BID  . povidone-iodine  2 application Topical Once  . senna-docusate  2 tablet Oral BID  . cholecalciferol  1,000 Units Oral Daily  . zinc sulfate  220 mg Oral Daily   Continuous Infusions: . [START ON 02/09/2019]  ceFAZolin (ANCEF) IV    .  ceFAZolin (ANCEF) IV       LOS: 4 days        Hosie Poisson, MD [facility]   To contact the attending provider between 7A-7P or the covering provider during after hours 7P-7A, please log into the web site [email or web address] and access using universal [place] password for that web site. If you do not have the password, please call the hospital operator.  02/07/2019, 6:24 PM

## 2019-02-07 NOTE — Progress Notes (Addendum)
Physical Therapy Treatment Patient Details Name: [name] MRN: [id number] DOB: 1934/07/26 Today's Date: 02/07/2019    History of Present Illness Pt adm after fall with displaced segmental fracture of the rt femur. Pt found to be Covid+. Pt had fall earlier in the day suffering a displaced olecranon fx on the rt and was splinted and dc'd home from the ED before suffering second fall. Pt underwent ORIF of rt femur on 1/29. Pt to have operative repair of rt olecranon at later date. PMH - dementia, craniotomy for aneurysm clipping.    PT Comments    Pt admitted with above diagnosis. Pt was able to sit EOB for 20 min with min to min guard assist.  Pt did have pain but could be distracted away from pain at times.  Pt needed total assist of 2 to squat pivot to recliner. Overall, tolerated treatment better today and pt pleasantly confused.  Worked on positioning of right LE with LE  In abducted position with knee in proper position at midline as pt tends to internally rotated and adduct. Pt currently with functional limitations due to balance and endurance deficits. Pt will benefit from skilled PT to increase their independence and safety with mobility to allow discharge to the venue listed below.     Follow Up Recommendations  SNF;Supervision/Assistance - 24 hour     Equipment Recommendations  Other (comment)(To be determined)    Recommendations for Other Services       Precautions / Restrictions Precautions Precautions: Fall Precaution Comments: x2 falls at home PTA Restrictions Weight Bearing Restrictions: Yes RUE Weight Bearing: Non weight bearing RLE Weight Bearing: Weight bearing as tolerated    Mobility  Bed Mobility Overal bed mobility: Needs Assistance Bed Mobility: Rolling;Supine to Sit;Sit to Supine Rolling: Max assist;+2 for physical assistance;+2 for safety/equipment;Total assist   Supine to sit: Max assist;Mod assist     General bed mobility comments: Assist with  all aspects due to pain in RLE. Did use helicopter technqiue with use of pad to get pt to EOB. Then pt pulled up on PT and was actively assisting to sit up.   Transfers Overall transfer level: Needs assistance Equipment used: 2 person hand held assist Transfers: Sit to/from [employer] Sit to Stand: Total assist;+2 physical assistance;From elevated surface   Squat pivot transfers: Total assist;+2 physical assistance;From elevated surface     General transfer comment: Stood with +2 total using bed pad under hips and able to squat pivot pt to chair.   Ambulation/Gait                 Stairs             Wheelchair Mobility    Modified Rankin (Stroke Patients Only)       Balance Overall balance assessment: Needs assistance;History of Falls Sitting-balance support: Feet supported;Single extremity supported Sitting balance-Leahy Scale: Fair Sitting balance - Comments: minguard-minA for static sitting balance sitting 20 min and pt combed hair as well as did some LE exercise. Tends to lean right.  Postural control: Right lateral lean;Posterior lean                                  Cognition Arousal/Alertness: Awake/alert Behavior During Therapy: WFL for tasks assessed/performed Overall Cognitive Status: History of cognitive impairments - at baseline  General Comments: pt with history of dementia, pleasantly confused during session, oriented to name and able to state DOB, required frequent reminders about her fall/hip injury       Exercises General Exercises - Lower Extremity Long Arc Quad: AAROM;Both;5 reps;Seated Hip ABduction/ADduction: AAROM;Both;5 reps;Supine    General Comments General comments (skin integrity, edema, etc.): 94/57; >90% RA, 103-128 bpm; MAP 68      Pertinent Vitals/Pain Pain Assessment: Faces Faces Pain Scale: Hurts whole lot Pain Location: R hip with certain  movements Pain Descriptors / Indicators: Discomfort;Grimacing;Crying;Sore Pain Intervention(s): Limited activity within patient's tolerance;Monitored during session;Repositioned;Patient requesting pain meds-RN notified;RN gave pain meds during session    Home Living                      Prior Function            PT Goals (current goals can now be found in the care plan section) Acute Rehab PT Goals Patient Stated Goal: Pt didn't state Progress towards PT goals: Progressing toward goals    Frequency    Min 3X/week      PT Plan Current plan remains appropriate    Co-evaluation PT/OT/SLP Co-Evaluation/Treatment: Yes Reason for Co-Treatment: Complexity of the patient's impairments (multi-system involvement);For patient/therapist safety PT goals addressed during session: Mobility/safety with mobility        AM-PAC PT "6 Clicks" Mobility   Outcome Measure  Help needed turning from your back to your side while in a flat bed without using bedrails?: Total Help needed moving from lying on your back to sitting on the side of a flat bed without using bedrails?: A Lot Help needed moving to and from a bed to a chair (including a wheelchair)?: Total Help needed standing up from a chair using your arms (e.g., wheelchair or bedside chair)?: Total Help needed to walk in hospital room?: Total Help needed climbing 3-5 steps with a railing? : Total 6 Click Score: 7    End of Session Equipment Utilized During Treatment: Gait belt Activity Tolerance: Patient limited by pain Patient left: with call bell/phone within reach;in chair Nurse Communication: Mobility status PT Visit Diagnosis: Other abnormalities of gait and mobility (R26.89);History of falling (Z91.81);Muscle weakness (generalized) (M62.81);Pain Pain - Right/Left: Right Pain - part of body: Hip;Leg     Time: [id number] PT Time Calculation (min) (ACUTE ONLY): 40 min  Charges:  $Therapeutic Exercise: 8-22  mins $Therapeutic Activity: 8-22 mins                     Kade Rickels W,PT Acute Rehabilitation Services Pager:  [phone]  Office:  [place] 02/07/2019, 1:02 PM

## 2019-02-07 NOTE — Progress Notes (Signed)
Nutrition Follow-up  RD working remotely.  DOCUMENTATION CODES:   Underweight  INTERVENTION:   -Continue Ensure Enlive po BID, each supplement provides 350 kcal and 20 grams of protein -Continue Magic cup TID with meals, each supplement provides 290 kcal and 9 grams of protein -Continue MVI with minerals daily -Continue dysphagia 3 (advanced mechanical soft) diet, for ease of intake  NUTRITION DIAGNOSIS:   Increased nutrient needs related to post-op healing as evidenced by estimated needs.  Ongoing  GOAL:   Patient will meet greater than or equal to 90% of their needs  Progressing   MONITOR:   PO intake, Supplement acceptance, Labs, Weight trends, Skin, I & O's  REASON FOR ASSESSMENT:   Consult Assessment of nutrition requirement/status  ASSESSMENT:   85 y/o female with h/o dementia, DM, sickle cell anemia, CHF, multiple myeloma and recent COVID infection admitted s/p fall and found to have femur and elbow fractures now s/p femur fracture repair 1/29.  1/29- s/p rt cephallomedullary nail  Reviewed I/O's: +210 ml x 24 hours and +3 L since admission  UOP: 800 ml x 24 hours   Attempted to speak with pt via phone, however, no answer. Pt with dementia and a poor historian. Per chart review, pt with delirium and had been yelling when attempted to be touched. Pt did work with PT today, who is recommending SNF at discharge.   Per orthopedics notes, plan for ORIF of rt olecranon on Wednesday 02/09/19.   Pt's intake has improved. Noted she consumed 50% of breakfast this morning. She is accepting Ensure supplements per MAR.   Given pt's dementia, advanced age, and history of poor oral intake, highly suspect pt with malnutrition, however, unable to identify at this time. RD will continue supplements to help optimize intake.   Per palliative care note, pt would not desire a feeding tube. Plan for outpatient palliative care follow-up.   Medications reviewed and include senokot  and vitamin D3.   Labs reviewed: K: 3.4.   Diet Order:   Diet Order            Diet NPO time specified  Diet effective midnight        DIET DYS 3 Room service appropriate? No; Fluid consistency: Thin  Diet effective now              EDUCATION NEEDS:   Not appropriate for education at this time  Skin:  Skin Assessment: Skin Integrity Issues: Skin Integrity Issues:: Incisions Incisions: closed rt hip  Last BM:  02/04/19  Height:   Ht Readings from Last 1 Encounters:  02/03/19 5' 6"  (1.676 m)    Weight:   Wt Readings from Last 1 Encounters:  02/03/19 41 kg    Ideal Body Weight:  59 kg  BMI:  Body mass index is 14.59 kg/m.  Estimated Nutritional Needs:   Kcal:  1200-1400kcal/day  Protein:  60-70g/day  Fluid:  >1L/day    Braeton Wolgamott A. Jimmye Norman, RD, LDN, [place] Registered Dietitian II Certified Diabetes Care and Education Specialist Pager: [phone] After hours Pager: [phone]

## 2019-02-07 NOTE — Plan of Care (Signed)
  Problem: Education: Goal: Knowledge of General Education information will improve Description: Including pain rating scale, medication(s)/side effects and non-pharmacologic comfort measures Outcome: Progressing   Problem: Clinical Measurements: Goal: Diagnostic test results will improve Outcome: Progressing   

## 2019-02-07 NOTE — Progress Notes (Signed)
I responded to spiritual care consult for support for family. I was not able to visit Pt due to Covid restrictions. However, I called [name] (pts daughter). I offered [name] space to voice concerns. She said that she was well informed with visit from the NP yesterday concerning decisions for [name]. Also, she said she spoke with surgeon today about deciding to have surgery on her mothers elbow this coming Wednesday. She said she felt that was best for right now.   [name] voiced that it has been difficult for the family in caring for her mom. And that after rehab, she said was thinking about Hospice for the future. [name] said she has concerns about what has caused her imbalance. I asked her if she voiced this to palliative care she said she believed she did. Spiritually, she said [name] has a faith and is comfortable in her beliefs. She said she was very thankful and voiced that her experience with the care she has received this time compared to what was experienced with her father is very compassionate and professional. Bonney Roussel is available as needed.   Palliative Care Chaplain Resident Fidel Levy MA [phone]

## 2019-02-07 NOTE — Progress Notes (Signed)
Talked to daughter Harlen Labs at [phone] and confirmed consent again for the elbow.  Plan for OR 02/09/19.  All questions answered.  Specific pertinent risks including loss of fixation, stiffness, periprosthetic fracture all discussed.

## 2019-02-08 LAB — BASIC METABOLIC PANEL
Anion gap: 12 (ref 5–15)
BUN: 16 mg/dL (ref 8–23)
CO2: 26 mmol/L (ref 22–32)
Calcium: 8.9 mg/dL (ref 8.9–10.3)
Chloride: 103 mmol/L (ref 98–111)
Creatinine, Ser: 0.63 mg/dL (ref 0.44–1.00)
GFR calc Af Amer: >60 mL/min (ref >60)
GFR calc non Af Amer: >60 mL/min (ref >60)
Glucose, Bld: 127 mg/dL — ABNORMAL HIGH (ref 70–99)
Potassium: 3.5 mmol/L (ref 3.5–5.1)
Sodium: 141 mmol/L (ref 135–145)

## 2019-02-08 LAB — CBC WITH DIFFERENTIAL/PLATELET
Abs Immature Granulocytes: 0.1 10*3/uL — ABNORMAL HIGH (ref 0.00–0.07)
Basophils Absolute: 0 10*3/uL (ref 0.0–0.1)
Basophils Relative: 0 %
Eosinophils Absolute: 0.1 10*3/uL (ref 0.0–0.5)
Eosinophils Relative: 1 %
HCT: 28.7 % — ABNORMAL LOW (ref 36.0–46.0)
Hemoglobin: 9.4 g/dL — ABNORMAL LOW (ref 12.0–15.0)
Immature Granulocytes: 1 %
Lymphocytes Relative: 15 %
Lymphs Abs: 1.3 10*3/uL (ref 0.7–4.0)
MCH: 31.8 pg (ref 26.0–34.0)
MCHC: 32.8 g/dL (ref 30.0–36.0)
MCV: 97 fL (ref 80.0–100.0)
Monocytes Absolute: 1 10*3/uL (ref 0.1–1.0)
Monocytes Relative: 12 %
Neutro Abs: 5.8 10*3/uL (ref 1.7–7.7)
Neutrophils Relative %: 71 %
Platelets: 437 10*3/uL — ABNORMAL HIGH (ref 150–400)
RBC: 2.96 MIL/uL — ABNORMAL LOW (ref 3.87–5.11)
RDW: 13.5 % (ref 11.5–15.5)
WBC: 8.3 10*3/uL (ref 4.0–10.5)
nRBC: 0.2 % (ref 0.0–0.2)

## 2019-02-08 NOTE — Progress Notes (Signed)
Physical Therapy Treatment Patient Details Name: [name] MRN: [id number] DOB: May 20, 1934 Today's Date: 02/08/2019    History of Present Illness Pt adm after fall with displaced segmental fracture of the rt femur. Pt found to be Covid+. Pt had fall earlier in the day suffering a displaced olecranon fx on the rt and was splinted and dc'd home from the ED before suffering second fall. Pt underwent ORIF of rt femur on 1/29. Pt to have operative repair of rt olecranon at later date. PMH - dementia, craniotomy for aneurysm clipping.    PT Comments    Pt admitted with above diagnosis. Pt was able to sit EOB for 5 minutes with min guard to min assist. Pt also performed some LE exercises.  Pt limited by cognition and pain in right LE.  Spent time at end of treatment positioning pt in midline as well as trying to get right LE in midline position and not internally rotated and adducted.   Pt currently with functional limitations due to balance and endurance deficits as well as cognition issues.  Pt will benefit from skilled PT to increase their independence and safety with mobility to allow discharge to the venue listed below.     Follow Up Recommendations  SNF;Supervision/Assistance - 24 hour     Equipment Recommendations  Other (comment)(To be determined)    Recommendations for Other Services       Precautions / Restrictions Precautions Precautions: Fall;Other (comment) Precaution Comments: NWB RUE, NO ROM, splint - surgery for 2/3 and MD says likely status will not change after surgery.  WBAT RLE Required Braces or Orthoses: Splint/Cast Splint/Cast: RUE Restrictions Weight Bearing Restrictions: Yes RUE Weight Bearing: Non weight bearing RLE Weight Bearing: Weight bearing as tolerated Other Position/Activity Restrictions: NO ROM to RUE    Mobility  Bed Mobility Overal bed mobility: Needs Assistance Bed Mobility: Rolling;Supine to Sit;Sit to Supine Rolling: Max assist;+2 for  physical assistance;+2 for safety/equipment;Total assist   Supine to sit: Max assist;Mod assist Sit to supine: Total assist;+2 for physical assistance;+2 for safety/equipment   General bed mobility comments: Assist with all aspects due to pain in RLE. Did use helicopter technqiue with use of pad to get pt to EOB. Then pt pulled up on PT and was actively assisting to sit up.   Transfers Overall transfer level: Needs assistance Equipment used: 2 person hand held assist Transfers: Lateral/Scoot Transfers Sit to Stand: Total assist;+2 physical assistance;From elevated surface   Squat pivot transfers: Total assist;+2 physical assistance;From elevated surface    Lateral/Scoot Transfers: Max assist;From elevated surface General transfer comment: Scooted pt to her right with use of pad to drop arm recliner with pt assisting by leaning forward and unweighting buttocks.   Ambulation/Gait                 Stairs             Wheelchair Mobility    Modified Rankin (Stroke Patients Only)       Balance Overall balance assessment: Needs assistance;History of Falls Sitting-balance support: Feet supported;Single extremity supported Sitting balance-Leahy Scale: Fair Sitting balance - Comments: minguard-minA for static sitting balance sitting 5 min and did some LE exercise. Tends to lean right, requires max multimodal cues to correct and only able to maintain midline for short periods of time  Postural control: Right lateral lean;Posterior lean Standing balance support: Bilateral upper extremity supported Standing balance-Leahy Scale: Zero  Cognition Arousal/Alertness: Awake/alert Behavior During Therapy: WFL for tasks assessed/performed Overall Cognitive Status: History of cognitive impairments - at baseline                                 General Comments: pt with history of dementia, pleasantly confused during session,  oriented to name and able to state DOB, required frequent reminders about her fall/hip injury . Pt not oriented to place, time or situation      Exercises General Exercises - Lower Extremity Quad Sets: AROM;Both;5 reps;Supine Long Arc Quad: AAROM;Both;5 reps;Seated Hip ABduction/ADduction: AAROM;Both;5 reps;Supine Other Exercises Other Exercises: functional use of LUE for overhead reach.  Pt NWB, NO ROM and splint for RUE.  Surgery tomorrow and MD states status unlikely to change post op    General Comments General comments (skin integrity, edema, etc.): BP 112/67 initially, 83/49 once in chair with pt stating she was a little dizzy, 93/64 at end of treatment. Other VSS      Pertinent Vitals/Pain Pain Assessment: Faces Faces Pain Scale: Hurts whole lot Pain Location: R hip with certain movements Pain Descriptors / Indicators: Discomfort;Grimacing;Sore Pain Intervention(s): Limited activity within patient's tolerance;Monitored during session;Repositioned    Home Living                      Prior Function            PT Goals (current goals can now be found in the care plan section) Acute Rehab PT Goals Patient Stated Goal: Pt didn't state, agreeable to moving with therapies PT Goal Formulation: Patient unable to participate in goal setting Time For Goal Achievement: 02/19/19 Potential to Achieve Goals: Fair Progress towards PT goals: Progressing toward goals    Frequency    Min 3X/week      PT Plan Current plan remains appropriate    Co-evaluation       OT goals addressed during session: ADL's and self-care;Other (comment)(basic orientation)      AM-PAC PT "6 Clicks" Mobility   Outcome Measure  Help needed turning from your back to your side while in a flat bed without using bedrails?: Total Help needed moving from lying on your back to sitting on the side of a flat bed without using bedrails?: A Lot Help needed moving to and from a bed to a chair  (including a wheelchair)?: Total Help needed standing up from a chair using your arms (e.g., wheelchair or bedside chair)?: Total Help needed to walk in hospital room?: Total Help needed climbing 3-5 steps with a railing? : Total 6 Click Score: 7    End of Session Equipment Utilized During Treatment: Gait belt Activity Tolerance: Patient limited by pain;Patient limited by fatigue Patient left: with call bell/phone within reach;in chair Nurse Communication: Mobility status PT Visit Diagnosis: Other abnormalities of gait and mobility (R26.89);History of falling (Z91.81);Muscle weakness (generalized) (M62.81);Pain Pain - Right/Left: Right Pain - part of body: Hip;Leg     Time: [id number] PT Time Calculation (min) (ACUTE ONLY): 23 min  Charges:  $Therapeutic Exercise: 8-22 mins $Therapeutic Activity: 8-22 mins                     Rafael Quesada W,PT Acute Rehabilitation Services Pager:  [phone]  Office:  [place] 02/08/2019, 11:15 AM

## 2019-02-08 NOTE — Progress Notes (Signed)
ORTHOPAEDIC PROGRESS NOTE  s/p Procedure(s): Right hip nail 02/04/19 ORIF Olecranon 02/09/19 SCHEDULED  SUBJECTIVE: Pleasantly confused, no complaints of pain.  OBJECTIVE: Pe: right lower extremity: incision CDI, leg lengths equal, warm well perfused foot, intact EHL/TA/GSC RUE: difficult exam due to patient cooperation, wiggling fingers, splint in place.  wwp hand.  Unable to fully test nerves due to confusion.    Vitals:   02/07/19 2100 02/08/19 0500  BP: (!) 95/58 130/85  Pulse:    Resp: 19 (!) 24  Temp: 98.3 F (36.8 C)   SpO2:       ASSESSMENT: [name] is a 85 y.o. female doing well postoperatively.  Plan for ORIF olecranon 02/09/19  Discussed case with daughter vicki sledge at length.  Plan for ORIF 2/3, all questions answered.  Consent filled out today, RN will have to get daugthers signature via phone consent.    PLAN: Weightbearing: WBAT RLE , NWB in splint RUE Insicional and dressing care: OK to remove dressings 7 days postop, leave steri strips and leave open to air with dry gauze PRN Orthopedic device(s): none Showering: PRN VTE prophylaxis: Lovenox 40mg  qd x6 weeks, if ambulating well will transition to aspirin in clinic Pain control: PRN meds, minimize narcs Follow - up plan: 2 weeks with XR in clinic Contact information:  Weekdays 8-5 [name] PA-C [phone], After hours and holidays please check [email or web address] for group call information for [facility]

## 2019-02-08 NOTE — Anesthesia Postprocedure Evaluation (Signed)
Anesthesia Post Note  Patient: [name]  Procedure(s) Performed: INTRAMEDULLARY (IM) NAIL INTERTROCHANTRIC (Right Hip)     Patient location during evaluation: Other Anesthesia Type: Spinal Level of consciousness: confused and awake Pain management: pain level controlled Vital Signs Assessment: post-procedure vital signs reviewed and stable Respiratory status: spontaneous breathing and respiratory function stable Cardiovascular status: blood pressure returned to baseline and stable Postop Assessment: no headache, no backache, no apparent nausea or vomiting, spinal receding and patient able to bend at knees Anesthetic complications: no    Last Vitals:  Vitals:   02/08/19 0500 02/08/19 0834  BP: 130/85 112/67  Pulse:  93  Resp: (!) 24 (!) 22  Temp:  36.7 C  SpO2:  98%    Last Pain:  Vitals:   02/08/19 0834  TempSrc: Oral  PainSc:                  Nayara Taplin,W. EDMOND

## 2019-02-08 NOTE — Progress Notes (Signed)
Occupational Therapy Treatment Patient Details Name: [name] MRN: [id number] DOB: 13-Oct-1934 Today's Date: 02/08/2019    History of present illness Pt adm after fall with displaced segmental fracture of the rt femur. Pt found to be Covid+. Pt had fall earlier in the day suffering a displaced olecranon fx on the rt and was splinted and dc'd home from the ED before suffering second fall. Pt underwent ORIF of rt femur on 1/29. Pt to have operative repair of rt olecranon at later date. PMH - dementia, craniotomy for aneurysm clipping.   OT comments  Pt pleasant and confused today.  Pt not oriented to time, place or situation - pt has h/o of dementia.  Contact MD for clarification orders for RUE. Pt is NWB, NO ROM and splint and scheduled for surgery RUE 2/3.  MD states weight bearing and ROM status unlikely to change after surgery.  Will defer RUE ROM/strengthening goal for now.  Pt able to participate in grooming activities with min a, mod cues and set up and UB bathing simulation with mod a . D/c plan remains appropriate.   Follow Up Recommendations  SNF;Supervision/Assistance - 24 hour    Equipment Recommendations  Other (comment)(defer to next venue)    Recommendations for Other Services      Precautions / Restrictions Precautions Precautions: Fall;Other (comment) Precaution Comments: NWB RUE, NO ROM, splint - surgery for 2/3 and MD says likely status will not change after surgery.  WBAT RLE Required Braces or Orthoses: Splint/Cast Splint/Cast: RUE Restrictions Weight Bearing Restrictions: Yes RUE Weight Bearing: Non weight bearing RLE Weight Bearing: Weight bearing as tolerated Other Position/Activity Restrictions: NO ROM to RUE       Mobility Bed Mobility Overal bed mobility: Needs Assistance Bed Mobility: Rolling;Supine to Sit;Sit to Supine Rolling: Max assist;+2 for physical assistance;+2 for safety/equipment;Total assist   Supine to sit: Max assist;Mod  assist Sit to supine: Total assist;+2 for physical assistance;+2 for safety/equipment   General bed mobility comments: pt seated EOB with PT upon arrival  Transfers Overall transfer level: Needs assistance Equipment used: 2 person hand held assist Transfers: Sit to/from [employer] Sit to Stand: Total assist;+2 physical assistance;From elevated surface   Squat pivot transfers: Total assist;+2 physical assistance;From elevated surface     General transfer comment: Stood with +2 total using bed pad under hips and able to squat pivot pt to chair.     Balance Overall balance assessment: Needs assistance;History of Falls Sitting-balance support: Feet supported;Single extremity supported Sitting balance-Leahy Scale: Fair Sitting balance - Comments: minguard-minA for static sitting balance sitting 20 min and pt combed hair as well as did some LE exercise. Tends to lean right, requires max multimodal cues to correct and only able to maintain midline for short periods of time  Postural control: Right lateral lean;Posterior lean Standing balance support: Bilateral upper extremity supported Standing balance-Leahy Scale: Zero                             ADL either performed or assessed with clinical judgement   ADL Overall ADL's : Needs assistance/impaired Eating/Feeding: Supervision/ safety Eating/Feeding Details (indicate cue type and reason): needs encouragement Grooming: Wash/dry face;Brushing hair;Set up;Minimal assistance Grooming Details (indicate cue type and reason): Pt needed min a to comb hair on R side - pt too confused to follow direction initially to use L hand.  Used hand over hand and then pt able to take over Upper Body  Bathing: Moderate assistance;Sitting                                   Vision       Perception     Praxis      Cognition Arousal/Alertness: Awake/alert Behavior During Therapy: WFL for tasks  assessed/performed Overall Cognitive Status: History of cognitive impairments - at baseline                                 General Comments: pt with history of dementia, pleasantly confused during session, oriented to name and able to state DOB, required frequent reminders about her fall/hip injury . Pt not oriented to place, time or situation        Exercises Exercises: General Lower Extremity General Exercises - Lower Extremity Long Arc Quad: AAROM;Both;5 reps;Seated Hip ABduction/ADduction: AAROM;Both;5 reps;Supine Other Exercises Other Exercises: functional use of LUE for overhead reach.  Pt NWB, NO ROM and splint for RUE.  Surgery tomorrow and MD states status unlikely to change post op   Shoulder Instructions       General Comments      Pertinent Vitals/ Pain       Pain Assessment: Faces Faces Pain Scale: Hurts whole lot Pain Location: R hip with certain movements Pain Descriptors / Indicators: Discomfort;Grimacing;Sore Pain Intervention(s): Limited activity within patient's tolerance;Monitored during session  Home Living                                          Prior Functioning/Environment              Frequency  Min 2X/week        Progress Toward Goals  OT Goals(current goals can now be found in the care plan section)  Progress towards OT goals: Progressing toward goals  Acute Rehab OT Goals Patient Stated Goal: Pt didn't state, agreeable to moving with therapies OT Goal Formulation: With patient Time For Goal Achievement: 02/19/19 Potential to Achieve Goals: Good  Plan Discharge plan remains appropriate    Co-evaluation          OT goals addressed during session: ADL's and self-care;Other (comment)(basic orientation)      AM-PAC OT "6 Clicks" Daily Activity     Outcome Measure   Help from another person eating meals?: A Little Help from another person taking care of personal grooming?: A Lot Help from  another person toileting, which includes using toliet, bedpan, or urinal?: Total Help from another person bathing (including washing, rinsing, drying)?: A Lot Help from another person to put on and taking off regular upper body clothing?: A Lot Help from another person to put on and taking off regular lower body clothing?: Total 6 Click Score: 11    End of Session    OT Visit Diagnosis: Pain;Other symptoms and signs involving cognitive function;Other abnormalities of gait and mobility (R26.89) Pain - Right/Left: Right Pain - part of body: Hip;Leg   Activity Tolerance Patient tolerated treatment well;Patient limited by pain   Patient Left in chair;with call bell/phone within reach   Nurse Communication          Time: 1027-1040 OT Time Calculation (min): 13 min  Charges: OT General Charges $OT Visit: 1 Visit OT Treatments $Self Care/Home Management :  8-22 mins    Quay Burow, OTR/L 02/08/2019, 10:55 AM

## 2019-02-08 NOTE — Progress Notes (Signed)
PROGRESS NOTE    [name]  MRN:[id number] DOB: 01/28/1934 DOA: 02/03/2019 PCP: Fagan, Roy, MD    Brief Narrative:  84-year-old lady prior history of dementia, cerebral artery aneurysm presented to ER with of right leg pain following a mechanical fall.  On arrival to ED she underwent x-rays showing a right intra-articular fracture and was given a posterior splint and discharged home.  She had another fall after returning home and sustained injury to the right leg.  X-rays of the right leg show acute fracture of the proximal right femur with lateral angulation just below the intertrochanteric region. Patient also tested positive for Covid on admission. Pt underwent surgical repair of the right femur  By Dr Varkey. She is scheduled for repair of the right elbow tomorrow. Hospital course complicated by post op hypoxia and anemia of blood loss. We were able to wean her off the oxygen. She received 1 unit of prbc transfusion for the anemia of blood loss from the surgery.  Meanwhile palliative care consulted for goals of care, discussions underway.    Assessment & Plan:   Principal Problem:   Displaced segmental fracture of shaft of right femur, initial encounter for closed fracture (HCC) Active Problems:   Distal radius fracture, left   Dementia (HCC)   Palliative care by specialist   Goals of care, counseling/discussion   Right hip pain   Muscular weakness   Delirium   Right femur fracture: X -rays/CT of the hip on the right shows Unstable comminuted extracapsular right femur fracture with predominantly intertrochanteric extension but with complete separation of the greater and lesser trochanters from both the femoral neck and the subtrochanteric region  Orthopedics consulted and underwent  Right cephallomedullary nail placement on 02/04/19 by Dr Varkey.  Plan for OR on Wed for ORIF R Olecranon fx with Dr. Varkey. PT evaluation recommending SNF.  TOC consult placed and discussed  with the daughter. The daughter reports there is limited facilities to choose from due to her covid positive status. She is requesting to get another covid test on Thursday or Friday after surgery .  WBAT ont he RLE, NWB In splint RUE. lovenox  40 mg daily for 6 weeks, and if ambulating , transition to oral aspirin as per orthopedics.  Pain control. and she is working with PT and is up in the chair.   Distal radius fracture on the right Splint placed. X-rays show Distracted intra-articular olecranon fracture with significant overlying soft tissue swelling. OR tomorrow.  for ORIF R Olecranon fx with Dr. Varkey Pain control NWB of the RUE while in splint.   COVID-19 positive No respiratory or  gastrointestinal symptoms.  But post op pt was hypoxic and required about 2l it of [place] oxygen.  But we were able to wean her off oxygen and her sats remained greater than 90%.  Chest x-ray do not show acute infection. No indication of steroids or remdesivir.  Follow inflammatory markers. Afebrile and WBC counts wnl.    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 No new complaints.    Dementia Pleasantly confused.  No agitation this morning  Anemia of blood loss from surgery:  Baseline hemoglobin is between 10 to 12. Post op hemoglobin dropped to 8 and to 7.6 to 6.7.  Anemia panel shows low iron levels.  Iron supplementation added.  Transfuse to keep hemoglobin greater than 7.  S/p 1 unit of prbc transfusion.  Repeat hemoglobin around 9.4. No   obvious signs of bleeding.   POOR oral intake:  Get nutrition consult.      In view of her multiple medical issues, poor functional status, poor progression, multiple fractures, family would like to talk to palliative care for goals of care. Recommend outpatient palliative care follow up.    DVT prophylaxis: Lovenox Code Status: DNR.  Family Communication: None at bedside, spoke to  daughter over the phone.  On 02/08/2019 Disposition Plan:  . Patient came from: Home            . Anticipated d/c place: SNF . Barriers to d/c OR conditions which need to be met to effect a safe d/c: Pending surgery   Consultants:   Orthopedics  Procedures: Surgical repair of the femur Antimicrobials: None  Subjective: No new complaints.sitting in the chair, pleasantly confused.   Objective: Vitals:   02/07/19 2100 02/08/19 0500 02/08/19 0834 02/08/19 1232  BP: (!) 95/58 130/85 112/67 106/86  Pulse:   93 96  Resp: 19 (!) 24 (!) 22 (!) 21  Temp: 98.3 F (36.8 C)  98 F (36.7 C) 99.6 F (37.6 C)  TempSrc: Oral  Oral Oral  SpO2:   98% 96%  Weight:      Height:        Intake/Output Summary (Last 24 hours) at 02/08/2019 1351 Last data filed at 02/08/2019 1135 Gross per 24 hour  Intake 225 ml  Output 925 ml  Net -700 ml   Filed Weights   02/03/19 1757  Weight: 41 kg    Examination:  General exam: comfortable, not in distress, on RA.  Respiratory system: bilateral air entry fair, no wheezing or rhonchi.  Cardiovascular system:S1S2, RRR, no JVD, no pedal edema.  Gastrointestinal system: abdomen is soft, non tender non distended, bowel sounds normal.  Central nervous system:  She is alert but pleasantly confused.  Extremities: painful ROM of the lower extremities. No pedal edema.  Skin: no rashes seen.  Psychiatry: pleasantly confused.     Data Reviewed: I have personally reviewed following labs and imaging studies  CBC: Recent Labs  Lab 02/03/19 1026 02/03/19 1831 02/04/19 1825 02/05/19 0352 02/06/19 0140 02/07/19 0222 02/08/19 1147  WBC 8.5   < > 10.5 8.3 8.1 8.5 8.3  NEUTROABS 6.9  --   --   --   --   --  5.8  HGB 13.0   < > 8.0* 7.6* 6.7* 8.7* 9.4*  HCT 41.0   < > 24.4* 22.9* 20.1* 25.6* 28.7*  MCV 98.3   < > 96.8 96.6 96.2 94.8 97.0  PLT 298   < > 249 231 295 307 437*   < > = values in this interval not displayed.   Basic Metabolic Panel: Recent  Labs  Lab 02/04/19 0421 02/04/19 0421 02/04/19 1825 02/05/19 0352 02/06/19 0140 02/06/19 0614 02/07/19 0222 02/08/19 1147  NA 137  --   --  135 137  --  138 141  K 4.4  --   --  3.8 3.4*  --  3.7 3.5  CL 102  --   --  102 102  --  104 103  CO2 27  --   --  23 25  --  26 26  GLUCOSE 114*  --   --  115* 91  --  116* 127*  BUN 19  --   --  15 12  --  19 16  CREATININE 0.66   < > 0.88 0.75 0.70  --  0.50  0.63  CALCIUM 8.8*  --   --  8.3* 8.1*  --  8.2* 8.9  MG  --   --   --   --   --  2.1  --   --    < > = values in this interval not displayed.   GFR: Estimated Creatinine Clearance: 35.1 mL/min (by C-G formula based on SCr of 0.63 mg/dL). Liver Function Tests: Recent Labs  Lab 02/03/19 1026  AST 30  ALT 18  ALKPHOS 80  BILITOT 0.9  PROT 7.1  ALBUMIN 3.8   No results for input(s): LIPASE, AMYLASE in the last 168 hours. No results for input(s): AMMONIA in the last 168 hours. Coagulation Profile: No results for input(s): INR, PROTIME in the last 168 hours. Cardiac Enzymes: Recent Labs  Lab 02/03/19 1026  CKTOTAL 154   BNP (last 3 results) No results for input(s): PROBNP in the last 8760 hours. HbA1C: No results for input(s): HGBA1C in the last 72 hours. CBG: No results for input(s): GLUCAP in the last 168 hours. Lipid Profile: No results for input(s): CHOL, HDL, LDLCALC, TRIG, CHOLHDL, LDLDIRECT in the last 72 hours. Thyroid Function Tests: No results for input(s): TSH, T4TOTAL, FREET4, T3FREE, THYROIDAB in the last 72 hours. Anemia Panel: No results for input(s): VITAMINB12, FOLATE, FERRITIN, TIBC, IRON, RETICCTPCT in the last 72 hours. Sepsis Labs: No results for input(s): PROCALCITON, LATICACIDVEN in the last 168 hours.  Recent Results (from the past 240 hour(s))  Respiratory Panel by RT PCR (Flu A&B, Covid) - Nasopharyngeal Swab     Status: Abnormal   Collection Time: 02/03/19  7:19 PM   Specimen: Nasopharyngeal Swab  Result Value Ref Range Status   SARS  Coronavirus 2 by RT PCR POSITIVE (A) NEGATIVE Final    Comment: RESULT CALLED TO, READ BACK BY AND VERIFIED WITH: T WALKER,RN @2058 02/03/19 MKELLY (NOTE) SARS-CoV-2 target nucleic acids are DETECTED. SARS-CoV-2 RNA is generally detectable in upper respiratory specimens  during the acute phase of infection. Positive results are indicative of the presence of the identified virus, but do not rule out bacterial infection or co-infection with other pathogens not detected by the test. Clinical correlation with patient history and other diagnostic information is necessary to determine patient infection status. The expected result is Negative. Fact Sheet for Patients:  https://www.fda.gov/media/142436/download Fact Sheet for Healthcare Providers: https://www.fda.gov/media/142435/download This test is not yet approved or cleared by the [place] FDA and  has been authorized for detection and/or diagnosis of SARS-CoV-2 by FDA under an Emergency Use Authorization (EUA).  This EUA will remain in effect (meaning this test can be used) for  the duration of  the COVID-19 declaration under Section 564(b)(1) of the Act, 21 U.S.C. section 360bbb-3(b)(1), unless the authorization is terminated or revoked sooner.    Influenza A by PCR NEGATIVE NEGATIVE Final   Influenza B by PCR NEGATIVE NEGATIVE Final    Comment: (NOTE) The Xpert Xpress SARS-CoV-2/FLU/RSV assay is intended as an aid in  the diagnosis of influenza from Nasopharyngeal swab specimens and  should not be used as a sole basis for treatment. Nasal washings and  aspirates are unacceptable for Xpert Xpress SARS-CoV-2/FLU/RSV  testing. Fact Sheet for Patients: https://www.fda.gov/media/142436/download Fact Sheet for Healthcare Providers: https://www.fda.gov/media/142435/download This test is not yet approved or cleared by the [place] FDA and  has been authorized for detection and/or diagnosis of SARS-CoV-2 by  FDA under an  Emergency Use Authorization (EUA). This EUA will remain  in effect (meaning this   test can be used) for the duration of the  Covid-19 declaration under Section 564(b)(1) of the Act, 21  U.S.C. section 360bbb-3(b)(1), unless the authorization is  terminated or revoked. Performed at [facility], [address] [place], [place] [address]   Surgical PCR screen     Status: None   Collection Time: 02/04/19  1:12 PM   Specimen: Nasal Mucosa; Nasal Swab  Result Value Ref Range Status   MRSA, PCR NEGATIVE NEGATIVE Final   Staphylococcus aureus NEGATIVE NEGATIVE Final    Comment: (NOTE) The Xpert SA Assay (FDA approved for NASAL specimens in patients 48 years of age and older), is one component of a comprehensive surveillance program. It is not intended to diagnose infection nor to guide or monitor treatment. Performed at [facility], [place] [address], [place], [place] [address]          Radiology Studies: CT ELBOW RIGHT WO CONTRAST  Result Date: 02/07/2019 CLINICAL DATA:  Right elbow pain status post fall, preop 02/09/2019. EXAM: CT OF THE UPPER RIGHT EXTREMITY WITHOUT CONTRAST TECHNIQUE: Multidetector CT imaging of the upper right extremity was performed according to the standard protocol. COMPARISON:  None. FINDINGS: Bones/Joint/Cartilage Limited evaluation secondary to positioning. Comminuted fracture of the olecranon with 2 cm of distraction. Mild cortical irregularity along the posterior aspect of the capitellum concerning for small marginal osteophyte versus a subtle nondisplaced fracture (image 31/series 4). Large joint effusion. No other acute fracture or dislocation. No aggressive osseous lesion. Ligaments Suboptimally assessed by CT. Muscles and Tendons Muscles are normal. No muscle atrophy. Biceps tendon is intact. Triceps tendon is intact. Soft tissues No fluid collection or hematoma. IMPRESSION: Comminuted fracture of the olecranon with 2 cm of distraction. Mild cortical  irregularity along the posterior aspect of the capitellum concerning for small marginal osteophyte versus a subtle nondisplaced fracture (image 31/series 4). Electronically Signed   By: Kathreen Devoid   On: 02/07/2019 16:39        Scheduled Meds: . acetaminophen  650 mg Oral TID  . vitamin C  500 mg Oral BID  . Chlorhexidine Gluconate Cloth  6 each Topical Daily  . enoxaparin (LOVENOX) injection  40 mg Subcutaneous Q24H  . feeding supplement (ENSURE ENLIVE)  237 mL Oral BID BM  . ferrous sulfate  325 mg Oral BID WC  . multivitamin with minerals  1 tablet Oral Daily  . mupirocin ointment  1 application Nasal BID  . povidone-iodine  2 application Topical Once  . senna-docusate  2 tablet Oral BID  . cholecalciferol  1,000 Units Oral Daily  . zinc sulfate  220 mg Oral Daily   Continuous Infusions: . [START ON 02/09/2019]  ceFAZolin (ANCEF) IV       LOS: 5 days        Hosie Poisson, MD [facility]   To contact the attending provider between 7A-7P or the covering provider during after hours 7P-7A, please log into the web site [email or web address] and access using universal [place] password for that web site. If you do not have the password, please call the hospital operator.  02/08/2019, 1:51 PM

## 2019-02-09 ENCOUNTER — Inpatient Hospital Stay (HOSPITAL_COMMUNITY): Payer: MEDICARE

## 2019-02-09 ENCOUNTER — Encounter (HOSPITAL_COMMUNITY): Payer: Self-pay | Admitting: Internal Medicine

## 2019-02-09 ENCOUNTER — Inpatient Hospital Stay (HOSPITAL_COMMUNITY): Payer: MEDICARE | Admitting: Anesthesiology

## 2019-02-09 ENCOUNTER — Encounter (HOSPITAL_COMMUNITY)
Admission: EM | Disposition: A | Discharge status: Skilled Nursing Facility | Payer: Self-pay | Source: Home / Self Care | Attending: Internal Medicine

## 2019-02-09 ENCOUNTER — Ambulatory Visit: Payer: MEDICARE | Admitting: Orthopedic Surgery

## 2019-02-09 DIAGNOSIS — S52502A Unspecified fracture of the lower end of left radius, initial encounter for closed fracture: Secondary | ICD-10-CM

## 2019-02-09 HISTORY — PX: ORIF ELBOW FRACTURE: SHX5031

## 2019-02-09 SURGERY — OPEN REDUCTION INTERNAL FIXATION (ORIF) ELBOW/OLECRANON FRACTURE
Anesthesia: General | Site: Elbow | Laterality: Right

## 2019-02-09 MED ORDER — SUGAMMADEX SODIUM 200 MG/2ML IV SOLN
INTRAVENOUS | Status: DC | PRN
  Administered 2019-02-09: 100 mg via INTRAVENOUS

## 2019-02-09 MED ORDER — VANCOMYCIN HCL 1000 MG IV SOLR
INTRAVENOUS | Status: AC
  Filled 2019-02-09: qty 1000

## 2019-02-09 MED ORDER — ROCURONIUM BROMIDE 100 MG/10ML IV SOLN
INTRAVENOUS | Status: DC | PRN
  Administered 2019-02-09: 50 mg via INTRAVENOUS

## 2019-02-09 MED ORDER — PROMETHAZINE HCL 25 MG/ML IJ SOLN: 6.2500 mg | INTRAMUSCULAR | Status: DC | PRN

## 2019-02-09 MED ORDER — CEFAZOLIN SODIUM-DEXTROSE 2-3 GM-%(50ML) IV SOLR
INTRAVENOUS | Status: DC | PRN
  Administered 2019-02-09: 2 g via INTRAVENOUS

## 2019-02-09 MED ORDER — DEXAMETHASONE SODIUM PHOSPHATE 10 MG/ML IJ SOLN
INTRAMUSCULAR | Status: AC
  Filled 2019-02-09: qty 1

## 2019-02-09 MED ORDER — EPHEDRINE 5 MG/ML INJ
INTRAVENOUS | Status: AC
  Filled 2019-02-09: qty 10

## 2019-02-09 MED ORDER — BUPIVACAINE HCL (PF) 0.25 % IJ SOLN
INTRAMUSCULAR | Status: DC | PRN
  Administered 2019-02-09: 10 mL

## 2019-02-09 MED ORDER — DOCUSATE SODIUM 100 MG PO CAPS
100.0000 mg | ORAL_CAPSULE | Freq: Two times a day (BID) | ORAL | Status: DC
  Administered 2019-02-09 – 2019-02-11 (×3): 100 mg via ORAL
  Filled 2019-02-09 (×4): qty 1

## 2019-02-09 MED ORDER — CEFAZOLIN SODIUM-DEXTROSE 1-4 GM/50ML-% IV SOLN
1.0000 g | Freq: Four times a day (QID) | INTRAVENOUS | Status: AC
  Administered 2019-02-09 – 2019-02-10 (×3): 1 g via INTRAVENOUS
  Filled 2019-02-09 (×3): qty 50

## 2019-02-09 MED ORDER — BUPIVACAINE HCL (PF) 0.25 % IJ SOLN
INTRAMUSCULAR | Status: AC
  Filled 2019-02-09: qty 30

## 2019-02-09 MED ORDER — PHENYLEPHRINE HCL-NACL 10-0.9 MG/250ML-% IV SOLN
INTRAVENOUS | Status: DC | PRN
  Administered 2019-02-09: 25 ug/min via INTRAVENOUS

## 2019-02-09 MED ORDER — ONDANSETRON HCL 4 MG/2ML IJ SOLN
INTRAMUSCULAR | Status: DC | PRN
  Administered 2019-02-09: 4 mg via INTRAVENOUS

## 2019-02-09 MED ORDER — ONDANSETRON HCL 4 MG/2ML IJ SOLN: 4.0000 mg | Freq: Four times a day (QID) | INTRAMUSCULAR | Status: DC | PRN

## 2019-02-09 MED ORDER — FENTANYL CITRATE (PF) 100 MCG/2ML IJ SOLN: 25.0000 ug | INTRAMUSCULAR | Status: DC | PRN

## 2019-02-09 MED ORDER — LIDOCAINE 2% (20 MG/ML) 5 ML SYRINGE
INTRAMUSCULAR | Status: AC
  Filled 2019-02-09: qty 5

## 2019-02-09 MED ORDER — CEFAZOLIN SODIUM 1 G IJ SOLR
INTRAMUSCULAR | Status: AC
  Filled 2019-02-09: qty 20

## 2019-02-09 MED ORDER — ONDANSETRON HCL 4 MG/2ML IJ SOLN
INTRAMUSCULAR | Status: AC
  Filled 2019-02-09: qty 2

## 2019-02-09 MED ORDER — VANCOMYCIN HCL 1000 MG IV SOLR
INTRAVENOUS | Status: DC | PRN
  Administered 2019-02-09: 1000 mg via TOPICAL

## 2019-02-09 MED ORDER — MEPERIDINE HCL 25 MG/ML IJ SOLN: 6.2500 mg | INTRAMUSCULAR | Status: DC | PRN

## 2019-02-09 MED ORDER — TAMSULOSIN HCL 0.4 MG PO CAPS
0.4000 mg | ORAL_CAPSULE | Freq: Every day | ORAL | Status: DC
  Administered 2019-02-10 – 2019-02-14 (×5): 0.4 mg via ORAL
  Filled 2019-02-09 (×5): qty 1

## 2019-02-09 MED ORDER — LIDOCAINE 2% (20 MG/ML) 5 ML SYRINGE
INTRAMUSCULAR | Status: DC | PRN
  Administered 2019-02-09: 100 mg via INTRAVENOUS

## 2019-02-09 MED ORDER — ACETAMINOPHEN 10 MG/ML IV SOLN
1000.0000 mg | Freq: Once | INTRAVENOUS | Status: DC | PRN
  Filled 2019-02-09: qty 100

## 2019-02-09 MED ORDER — SODIUM CHLORIDE 0.9 % IV SOLN
INTRAVENOUS | Status: DC | PRN
  Administered 2019-02-09: 21:00:00 250 mL via INTRAVENOUS
  Administered 2019-02-10: 23:00:00 1000 mL via INTRAVENOUS

## 2019-02-09 MED ORDER — PHENYLEPHRINE 40 MCG/ML (10ML) SYRINGE FOR IV PUSH (FOR BLOOD PRESSURE SUPPORT)
PREFILLED_SYRINGE | INTRAVENOUS | Status: AC
  Filled 2019-02-09: qty 10

## 2019-02-09 MED ORDER — ROCURONIUM BROMIDE 10 MG/ML (PF) SYRINGE
PREFILLED_SYRINGE | INTRAVENOUS | Status: AC
  Filled 2019-02-09: qty 10

## 2019-02-09 MED ORDER — FENTANYL CITRATE (PF) 100 MCG/2ML IJ SOLN
INTRAMUSCULAR | Status: AC
  Filled 2019-02-09: qty 2

## 2019-02-09 MED ORDER — DEXAMETHASONE SODIUM PHOSPHATE 10 MG/ML IJ SOLN
INTRAMUSCULAR | Status: DC | PRN
  Administered 2019-02-09: 4 mg via INTRAVENOUS

## 2019-02-09 MED ORDER — CHLORHEXIDINE GLUCONATE CLOTH 2 % EX PADS
6.0000 | MEDICATED_PAD | Freq: Every day | CUTANEOUS | Status: DC
  Administered 2019-02-10 – 2019-02-14 (×5): 6 via TOPICAL

## 2019-02-09 MED ORDER — FENTANYL CITRATE (PF) 250 MCG/5ML IJ SOLN
INTRAMUSCULAR | Status: AC
  Filled 2019-02-09: qty 5

## 2019-02-09 MED ORDER — PROPOFOL 10 MG/ML IV BOLUS
INTRAVENOUS | Status: DC | PRN
  Administered 2019-02-09: 130 mg via INTRAVENOUS

## 2019-02-09 MED ORDER — ENOXAPARIN SODIUM 40 MG/0.4ML ~~LOC~~ SOLN
40.0000 mg | SUBCUTANEOUS | Status: DC
  Administered 2019-02-10 – 2019-02-14 (×5): 40 mg via SUBCUTANEOUS
  Filled 2019-02-09 (×5): qty 0.4

## 2019-02-09 MED ORDER — FENTANYL CITRATE (PF) 100 MCG/2ML IJ SOLN
INTRAMUSCULAR | Status: DC | PRN
  Administered 2019-02-09: 25 ug via INTRAVENOUS
  Administered 2019-02-09: 100 ug via INTRAVENOUS
  Administered 2019-02-09: 25 ug via INTRAVENOUS

## 2019-02-09 MED ORDER — ONDANSETRON HCL 4 MG PO TABS: 4.0000 mg | ORAL_TABLET | Freq: Four times a day (QID) | ORAL | Status: DC | PRN

## 2019-02-09 MED ORDER — ESMOLOL HCL 100 MG/10ML IV SOLN
INTRAVENOUS | Status: AC
  Filled 2019-02-09: qty 10

## 2019-02-09 MED ORDER — 0.9 % SODIUM CHLORIDE (POUR BTL) OPTIME
TOPICAL | Status: DC | PRN
  Administered 2019-02-09: 1000 mL

## 2019-02-09 MED ORDER — SUCCINYLCHOLINE CHLORIDE 200 MG/10ML IV SOSY
PREFILLED_SYRINGE | INTRAVENOUS | Status: AC
  Filled 2019-02-09: qty 10

## 2019-02-09 MED ORDER — LACTATED RINGERS IV SOLN: INTRAVENOUS | Status: DC | PRN

## 2019-02-09 MED ORDER — PHENYLEPHRINE 40 MCG/ML (10ML) SYRINGE FOR IV PUSH (FOR BLOOD PRESSURE SUPPORT)
PREFILLED_SYRINGE | INTRAVENOUS | Status: DC | PRN
  Administered 2019-02-09: 120 ug via INTRAVENOUS
  Administered 2019-02-09: 20 ug via INTRAVENOUS

## 2019-02-09 SURGICAL SUPPLY — 68 items
APL PRP STRL LF DISP 70% ISPRP (MISCELLANEOUS) ×1
BIT DRILL CANNULATED 5.0 (BIT) ×2 IMPLANT
BLADE CLIPPER SURG (BLADE) IMPLANT
BNDG CMPR 9X4 STRL LF SNTH (GAUZE/BANDAGES/DRESSINGS) ×1
BNDG COHESIVE 4X5 TAN STRL (GAUZE/BANDAGES/DRESSINGS) ×3 IMPLANT
BNDG ELASTIC 3X5.8 VLCR STR LF (GAUZE/BANDAGES/DRESSINGS) ×1 IMPLANT
BNDG ELASTIC 4X5.8 VLCR STR LF (GAUZE/BANDAGES/DRESSINGS) ×3 IMPLANT
BNDG ESMARK 4X9 LF (GAUZE/BANDAGES/DRESSINGS) ×3 IMPLANT
BNDG GAUZE ELAST 4 BULKY (GAUZE/BANDAGES/DRESSINGS) ×1 IMPLANT
CHLORAPREP W/TINT 26 (MISCELLANEOUS) ×3 IMPLANT
CLOSURE STERI-STRIP 1/2X4 (GAUZE/BANDAGES/DRESSINGS) ×1
CLSR STERI-STRIP ANTIMIC 1/2X4 (GAUZE/BANDAGES/DRESSINGS) ×1 IMPLANT
COVER SURGICAL LIGHT HANDLE (MISCELLANEOUS) ×3 IMPLANT
COVER WAND RF STERILE (DRAPES) ×1 IMPLANT
CUFF TOURN SGL QUICK 18X4 (TOURNIQUET CUFF) ×3 IMPLANT
CUFF TOURN SGL QUICK 24 (TOURNIQUET CUFF)
CUFF TRNQT CYL 24X4X16.5-23 (TOURNIQUET CUFF) IMPLANT
DRAPE INCISE IOBAN 66X45 STRL (DRAPES) ×3 IMPLANT
DRAPE OEC MINIVIEW 54X84 (DRAPES) ×2 IMPLANT
DRAPE ORTHO SPLIT 77X108 STRL (DRAPES) ×3
DRAPE SURG ORHT 6 SPLT 77X108 (DRAPES) ×1 IMPLANT
DRAPE U-SHAPE 47X51 STRL (DRAPES) ×3 IMPLANT
ELECT REM PT RETURN 9FT ADLT (ELECTROSURGICAL)
ELECTRODE REM PT RTRN 9FT ADLT (ELECTROSURGICAL) IMPLANT
GAUZE SPONGE 4X4 12PLY STRL (GAUZE/BANDAGES/DRESSINGS) ×3 IMPLANT
GAUZE XEROFORM 5X9 LF (GAUZE/BANDAGES/DRESSINGS) ×1 IMPLANT
GLOVE BIO SURGEON STRL SZ7 (GLOVE) ×3 IMPLANT
GLOVE BIO SURGEON STRL SZ7.5 (GLOVE) ×3 IMPLANT
GLOVE BIOGEL PI IND STRL 8 (GLOVE) ×1 IMPLANT
GLOVE BIOGEL PI INDICATOR 8 (GLOVE) ×2
GOWN STRL REUS W/ TWL LRG LVL3 (GOWN DISPOSABLE) ×4 IMPLANT
GOWN STRL REUS W/ TWL XL LVL3 (GOWN DISPOSABLE) ×1 IMPLANT
GOWN STRL REUS W/TWL LRG LVL3 (GOWN DISPOSABLE) ×12
GOWN STRL REUS W/TWL XL LVL3 (GOWN DISPOSABLE) ×3
GUIDEPIN 3.2MM DIA 12INL (PIN) ×2 IMPLANT
KIT BASIN OR (CUSTOM PROCEDURE TRAY) ×3 IMPLANT
KIT TURNOVER KIT B (KITS) ×3 IMPLANT
LOOP VESSEL MAXI BLUE (MISCELLANEOUS) IMPLANT
MANIFOLD NEPTUNE II (INSTRUMENTS) ×5 IMPLANT
NEEDLE 22X1 1/2 (OR ONLY) (NEEDLE) IMPLANT
NS IRRIG 1000ML POUR BTL (IV SOLUTION) ×3 IMPLANT
PACK ORTHO EXTREMITY (CUSTOM PROCEDURE TRAY) ×3 IMPLANT
PAD ARMBOARD 7.5X6 YLW CONV (MISCELLANEOUS) ×4 IMPLANT
PAD CAST 4YDX4 CTTN HI CHSV (CAST SUPPLIES) ×1 IMPLANT
PADDING CAST COTTON 4X4 STRL (CAST SUPPLIES) ×9
SCREW CANN PT SD 7X95 (Screw) ×2 IMPLANT
SLING ARM FOAM STRAP MED (SOFTGOODS) ×2 IMPLANT
SPONGE LAP 18X18 RF (DISPOSABLE) ×3 IMPLANT
SPONGE LAP 4X18 RFD (DISPOSABLE) ×3 IMPLANT
STAPLER VISISTAT 35W (STAPLE) ×1 IMPLANT
STOCKINETTE IMPERVIOUS 9X36 MD (GAUZE/BANDAGES/DRESSINGS) ×3 IMPLANT
SUCTION FRAZIER HANDLE 10FR (MISCELLANEOUS) ×2
SUCTION TUBE FRAZIER 10FR DISP (MISCELLANEOUS) ×1 IMPLANT
SUT ETHILON 4 0 PS 2 18 (SUTURE) ×1 IMPLANT
SUT MAXBRAID #2 CVD NDL (SUTURE) ×4 IMPLANT
SUT MNCRL AB 4-0 PS2 18 (SUTURE) ×2 IMPLANT
SUT VIC AB 2-0 CT1 36 (SUTURE) ×1 IMPLANT
SUT VIC AB 3-0 SH 27 (SUTURE) ×3
SUT VIC AB 3-0 SH 27X BRD (SUTURE) IMPLANT
SUT VICRYL 0 27 CT2 27 ABS (SUTURE) ×2 IMPLANT
SYR CONTROL 10ML LL (SYRINGE) IMPLANT
TOWEL GREEN STERILE (TOWEL DISPOSABLE) ×3 IMPLANT
TOWEL GREEN STERILE FF (TOWEL DISPOSABLE) ×5 IMPLANT
TUBE CONNECTING 12'X1/4 (SUCTIONS) ×2
TUBE CONNECTING 12X1/4 (SUCTIONS) ×3 IMPLANT
WASHER 5.5MM STAINLESS STEEL (Washer) ×2 IMPLANT
WATER STERILE IRR 1000ML POUR (IV SOLUTION) ×1 IMPLANT
YANKAUER SUCT BULB TIP NO VENT (SUCTIONS) ×3 IMPLANT

## 2019-02-09 NOTE — Plan of Care (Signed)
  Problem: Education: Goal: Knowledge of General Education information will improve Description Including pain rating scale, medication(s)/side effects and non-pharmacologic comfort measures Outcome: Progressing   Problem: Clinical Measurements: Goal: Ability to maintain clinical measurements within normal limits will improve Outcome: Progressing   Problem: Clinical Measurements: Goal: Will remain free from infection Outcome: Progressing   

## 2019-02-09 NOTE — Interval H&P Note (Signed)
History and Physical Interval Note:  02/09/2019 10:11 AM  [name]  has presented today for surgery, with the diagnosis of displaced olecranon fx.  The various methods of treatment have been discussed with the patient and family. After consideration of risks, benefits and other options for treatment, the patient has consented to  Procedure(s): OPEN REDUCTION INTERNAL FIXATION (ORIF) ELBOW/OLECRANON FRACTURE (Right) as a surgical intervention.  The patient's history has been reviewed, patient examined, no change in status, stable for surgery.  I have reviewed the patient's chart and labs.  Questions were answered to the patient's satisfaction.     Hiram Gash

## 2019-02-09 NOTE — Op Note (Signed)
Orthopaedic Surgery Operative Note (CSN: [id number])  [name]  13-May-1934 Date of Surgery: 02/09/2019   Diagnoses:  Displaced right olecranon fracture  Procedure: Right olecranon fracture open reduction internal fixation with tension banding and backup triceps fixation through bone tunnel   Operative Finding Successful completion of the planned procedure.  Patient's bone quality was relatively poor.  In the setting of her dementia as well we felt that backing up her fixation with a Krakw type suture in the triceps and passing through bone tunnel would be appropriate.  Make sure not to over tension this to avoid decreasing the tension band effect of normal suture.  Good bite with our screw however it is a relatively comminuted fracture patient's fixation was overall reasonable and stable to an arc of motion.  If she fails to be a candidate for an excision and triceps advancement.  Post-operative plan: The patient will be nonweightbearing in a splint for 2 weeks with range of motion to start gently after that and consideration for a removable posterior splint.  The patient will be readmitted to the hospitalist due to COVID-19.  DVT prophylaxis per the hospitalist baseline, we had already had the patient on Lovenox secondary to her hip fracture.   Pain control with PRN pain medication preferring oral medicines.  Follow up plan will be scheduled in approximately 14 days for incision check and XR  Post-Op Diagnosis: Same Surgeons:Primary: Hiram Gash, MD Assistants:Caroline McBane PA-C Location: [facility] OR ROOM 05 Anesthesia: General with local anesthesia Antibiotics: Ancef 2 g with local vancomycin powder 1 g at the surgical site Tourniquet time:  Total Tourniquet Time Documented: Upper Arm (Right) - 38 minutes Total: Upper Arm (Right) - 38 minutes  Estimated Blood Loss: Minimal Complications: None Specimens: None Implants: * No implants in log *  Indications for Surgery:   [name] is a 85 y.o. female with fall resulting in multiple injuries including a hip fracture which we provided care for 4 days ago and a displaced olecranon fracture.  Due to the patient's Covid status it was not feasible to easily transition from the spinal anesthesia to a different bed and general anesthesia so we came back for secondary surgery when patient was more stable.  Benefits and risks of operative and nonoperative management were discussed prior to surgery with patient/guardian(s) and informed consent form was completed.   we discussed with the patient's daughter risk benefits alternatives surgery and plan for open reduction trial fixation of the olecranon.  They understood that there are risks of infection, loss of hardware fixation, stiffness and neurovascular injury as well as periprosthetic fracture.   Procedure:   The patient was identified properly. Informed consent was obtained and the surgical site was marked. The patient was taken up to suite where general anesthesia was induced.  The patient was positioned lateral on a beanbag over sure foot positioner.  The right elbow was prepped and draped in the usual sterile fashion.  Timeout was performed before the beginning of the case.  Tourniquet was used for the above duration.  Began with a longitudinal posterior approach to the olecranon.  We went through skin sharply achieving hemostasis we progressed.  We stayed away from the medial structures to avoid damage to all her medial antebrachial cutaneous nerve.  That point we encountered fracture hematoma and the fracture site.  We cleared all hematoma and were able to assess that there was some mild comminution but we had a relatively large fragment that  was amenable to intramedullary screw fixation with tension banding.  We then reduced our fracture anatomically holding with a Weber clamp.  We checked fluoroscopic images to ensure that we like to reduction at that point placed a Zimmer 7  mm cannulated screw wire intramedullary check and position on fluoroscopy.  We measured and selected a 95 length screw with a washer and after drilling the proximal bone we started the screw into the shaft.  Before we had set the screw completely we made a transverse 2 mm drill tunnel and passed to max braid sutures through this tunnel.  One was used for figure-of-eight tension band around the screw itself and the other was whipstitched into the distal triceps in a Krakw fashion.  We advanced the cannulated screw and obtained robust purchase and compression at the fracture site.  At that point we checked on fluoroscopic images and were happy with our reduction.  We then tied our figure-of-eight tension band.  This provided additional compression at the fracture site.  We then tied our biceps backup suture taking care not to put the elbow into much extension in order to avoid capturing the elbow.  This was merely backup fixation we do not want to overload the bone tunnel.  We took the elbow through range of motion check final fluoroscopic images demonstrating an reduction and adequate placement of her screw.  We irrigated the wound copiously before placing local antibiotic as listed above.  Close the incision in a multilayer fashion with absorbable suture.  Sterile dressing was placed.  Well molded well-padded splint was placed.  Patient was awoken taken to PACU in stable condition.  [name], PA-C, present and scrubbed throughout the case, critical for completion in a timely fashion, and for retraction, instrumentation, closure.

## 2019-02-09 NOTE — Transfer of Care (Signed)
Immediate Anesthesia Transfer of Care Note  Patient: [name]  Procedure(s) Performed: OPEN REDUCTION INTERNAL FIXATION (ORIF) ELBOW/OLECRANON FRACTURE (Right Elbow)  Patient Location: Remains in OR to recover due to covid + status. report given to PACU RN.  Anesthesia Type:General  Level of Consciousness: awake  Airway & Oxygen Therapy: Patient Spontanous Breathing and Patient connected to face mask oxygen  Post-op Assessment: Report given to RN and Post -op Vital signs reviewed and stable  Post vital signs: Reviewed and stable  Last Vitals:  Vitals Value Taken Time  BP    Temp    Pulse    Resp    SpO2      Last Pain:  Vitals:   02/09/19 0600  TempSrc:   PainSc: 5          Complications: No apparent anesthesia complications

## 2019-02-09 NOTE — Anesthesia Preprocedure Evaluation (Signed)
Anesthesia Evaluation  Patient identified by MRN, date of birth, ID band Patient confused    Reviewed: Allergy & Precautions, NPO status , Patient's Chart, lab work & pertinent test results  History of Anesthesia Complications Negative for: history of anesthetic complications  Airway Mallampati: III  TM Distance: >3 FB     Dental  (+) Edentulous Upper   Pulmonary  Covid positive   Pulmonary exam normal        Cardiovascular negative cardio ROS Normal cardiovascular exam     Neuro/Psych PSYCHIATRIC DISORDERS Dementia negative neurological ROS     GI/Hepatic negative GI ROS, Neg liver ROS,   Endo/Other  negative endocrine ROS  Renal/GU   negative genitourinary   Musculoskeletal negative musculoskeletal ROS (+)   Abdominal Normal abdominal exam  (+)   Peds  Hematology negative hematology ROS (+) anemia ,   Anesthesia Other Findings   Reproductive/Obstetrics                             Anesthesia Physical  Anesthesia Plan  ASA: III  Anesthesia Plan: General   Post-op Pain Management:    Induction: Intravenous and Rapid sequence  PONV Risk Score and Plan: 2 and Ondansetron  Airway Management Planned: Oral ETT  Additional Equipment: None  Intra-op Plan:   Post-operative Plan:   Informed Consent: I have reviewed the patients History and Physical, chart, labs and discussed the procedure including the risks, benefits and alternatives for the proposed anesthesia with the patient or authorized representative who has indicated his/her understanding and acceptance.     Dental advisory given and Consent reviewed with POA  Plan Discussed with: CRNA  Anesthesia Plan Comments:         Anesthesia Quick Evaluation

## 2019-02-09 NOTE — Anesthesia Postprocedure Evaluation (Signed)
Anesthesia Post Note  Patient: [name]  Procedure(s) Performed: OPEN REDUCTION INTERNAL FIXATION (ORIF) ELBOW/OLECRANON FRACTURE (Right Elbow)     Patient location during evaluation: PACU Anesthesia Type: General Level of consciousness: awake Pain management: pain level controlled Vital Signs Assessment: post-procedure vital signs reviewed and stable Respiratory status: spontaneous breathing, nonlabored ventilation, respiratory function stable and patient connected to nasal cannula oxygen Cardiovascular status: blood pressure returned to baseline and stable Postop Assessment: no apparent nausea or vomiting Anesthetic complications: no    Last Vitals:  Vitals:   02/09/19 2000 02/09/19 2020  BP: 95/62   Pulse: 97 98  Resp: (!) 21 18  Temp: 36.7 C   SpO2: 94% 95%    Last Pain:  Vitals:   02/09/19 2020  TempSrc:   PainSc: 0-No pain                 Reality Dejonge P Aqib Lough

## 2019-02-09 NOTE — Progress Notes (Signed)
PROGRESS NOTE                                                                                                                                                                                                             Patient Demographics:    [name], is a 85 y.o. female, DOB - 12-12-1934, [id number]  Outpatient Primary MD for the patient is [name], MD   Admit date - 02/03/2019   LOS - 6  Chief Complaint  Patient presents with   Leg Pain       Brief Narrative: Patient is a 85 y.o. 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  See below for further details.   Subjective:    [name] today remains pleasantly confused-no major issues overnight per RN at bedside.   Assessment  & Plan :   COVID-19 infection-appears to be asymptomatic-continue to monitor-if patient starts developing symptoms-we will need to start patient on remdesivir.  Chest x-ray on 1/29 without any pneumonia.  Fever: afebrile  O2 requirements:  SpO2: 95 % O2 Flow Rate (L/min): 2 L/min   COVID-19 Labs: No results for input(s): DDIMER, FERRITIN, LDH, CRP in the last 72 hours.  No results found for: BNP  No results for input(s): PROCALCITON in the last 168 hours.  Lab Results  Component Value Date   SARSCOV2NAA POSITIVE (A) 02/03/2019     COVID-19 Medications: None  Prone/Incentive Spirometry: encouraged  incentive spirometry use 3-4/hour.  DVT Prophylaxis  :  Lovenox   Right proximal femur fracture: S/p right cephalomedullary nail on 1/29.  Orthopedics following-recommendations are for WBAT RLE-Lovenox 40 mg daily x6 weeks.  Comminuted fracture of the right olecranon: Scheduled for ORIF on 2/3-orthopedics  following.  NWB to RUE while in splint.  Acute blood loss anemia from perioperative blood loss: Hemoglobin stable-s/p 1 unit of PRBC so far.  Follow.  Dementia: At risk for delirium-currently pleasantly confused-maintain delirium precautions.  Other issues: Has Foley catheter-apparently was placed a few days back due to acute urinary retention-we will need to remove postoperatively.  Start Flomax today.  Nutrition Problem: Nutrition Problem: Increased nutrient needs Etiology: post-op healing Signs/Symptoms: estimated needs  Interventions: MVI, Ensure Enlive (each supplement provides 350kcal and 20 grams of protein), Magic cup     Consults  :  Orthopedics  Procedures  :  1/29>> right cephalomedullary nail   ABG: No results found for: PHART, PCO2ART, PO2ART, HCO3, TCO2, ACIDBASEDEF, O2SAT  Condition -  Guarded  Family Communication  :  Daughter updated over the phone  Code Status :DNR  Diet :  Diet Order            Diet NPO time specified  Diet effective midnight               Disposition Plan  :  SNF on discharge  Barriers to discharge: Scheduled for ORIF right wrist on 2/3-following which-hospital course needs to be followed to determine optimal timing of discharge.  Antimicorbials  :    Anti-infectives (From admission, onward)   Start     Dose/Rate Route Frequency Ordered Stop   02/09/19 0600  ceFAZolin (ANCEF) IVPB 2g/100 mL premix     2 g 200 mL/hr over 30 Minutes Intravenous On call to O.R. 02/04/19 1802 02/09/19 0623   02/07/19 1000  ceFAZolin (ANCEF) IVPB 2g/100 mL premix     2 g 200 mL/hr over 30 Minutes Intravenous To Short Stay 02/07/19 0800 02/08/19 1000   02/04/19 2200  ceFAZolin (ANCEF) IVPB 2g/100 mL premix     2 g 200 mL/hr over 30 Minutes Intravenous Every 8 hours 02/04/19 1802 02/05/19 0727      Inpatient Medications  Scheduled Meds:  acetaminophen  650 mg Oral TID   vitamin C  500 mg Oral BID   Chlorhexidine Gluconate Cloth  6 each  Topical Daily   enoxaparin (LOVENOX) injection  40 mg Subcutaneous Q24H   feeding supplement (ENSURE ENLIVE)  237 mL Oral BID BM   ferrous sulfate  325 mg Oral BID WC   multivitamin with minerals  1 tablet Oral Daily   povidone-iodine  2 application Topical Once   senna-docusate  2 tablet Oral BID   cholecalciferol  1,000 Units Oral Daily   zinc sulfate  220 mg Oral Daily   Continuous Infusions: PRN Meds:.bisacodyl, HYDROmorphone (DILAUDID) injection, lip balm, magnesium citrate, menthol-cetylpyridinium **OR** phenol, ondansetron **OR** ondansetron (ZOFRAN) IV, oxyCODONE, polyethylene glycol   Time Spent in minutes  25  See all Orders from today for further details   Oren Binet M.D on 02/09/2019 at 10:34 AM  To page go to [email or web address] - use universal password  [facility] -  Office  [phone]    Objective:   Vitals:   02/09/19 0400 02/09/19 0500 02/09/19 0600 02/09/19 0800  BP:    95/61  Pulse: 86 86  91  Resp: 17 20 (!) 23 17  Temp: 99 F (37.2 C)   98.4 F (36.9 C)  TempSrc: Oral     SpO2: 95% 94%  95%  Weight:      Height:        Wt Readings from Last 3 Encounters:  02/03/19 41 kg  02/03/19 41.7 kg  09/06/18 40.4 kg     Intake/Output Summary (Last 24 hours) at 02/09/2019 1034 Last data filed at 02/09/2019 0800 Gross per 24 hour  Intake --  Output 1575 ml  Net -1575 ml     Physical Exam Gen Exam: Pleasantly confused-not in any distress. HEENT:atraumatic, normocephalic Chest: B/L clear to auscultation anteriorly CVS:S1S2 regular Abdomen:soft non tender, non distended Extremities:no edema Neurology: Moves all 4 extremities. Skin: no rash   Data Review:  CBC Recent Labs  Lab 02/03/19 1026 02/03/19 1831 02/04/19 1825 02/05/19 0352 02/06/19 0140 02/07/19 0222 02/08/19 1147  WBC 8.5   < > 10.5 8.3 8.1 8.5 8.3  HGB 13.0   < > 8.0* 7.6* 6.7* 8.7* 9.4*  HCT 41.0   < > 24.4* 22.9* 20.1* 25.6* 28.7*  PLT 298   < > 249 231  295 307 437*  MCV 98.3   < > 96.8 96.6 96.2 94.8 97.0  MCH 31.2   < > 31.7 32.1 32.1 32.2 31.8  MCHC 31.7   < > 32.8 33.2 33.3 34.0 32.8  RDW 12.3   < > 12.6 12.6 12.3 13.2 13.5  LYMPHSABS 0.7  --   --   --   --   --  1.3  MONOABS 0.8  --   --   --   --   --  1.0  EOSABS 0.0  --   --   --   --   --  0.1  BASOSABS 0.0  --   --   --   --   --  0.0   < > = values in this interval not displayed.    Chemistries  Recent Labs  Lab 02/03/19 1026 02/03/19 1831 02/04/19 0421 02/04/19 0421 02/04/19 1825 02/05/19 0352 02/06/19 0140 02/06/19 0614 02/07/19 0222 02/08/19 1147  NA 138   < > 137  --   --  135 137  --  138 141  K 4.2   < > 4.4  --   --  3.8 3.4*  --  3.7 3.5  CL 101   < > 102  --   --  102 102  --  104 103  CO2 29   < > 27  --   --  23 25  --  26 26  GLUCOSE 116*   < > 114*  --   --  115* 91  --  116* 127*  BUN 19   < > 19  --   --  15 12  --  19 16  CREATININE 0.71   < > 0.66   < > 0.88 0.75 0.70  --  0.50 0.63  CALCIUM 9.1   < > 8.8*  --   --  8.3* 8.1*  --  8.2* 8.9  MG  --   --   --   --   --   --   --  2.1  --   --   AST 30  --   --   --   --   --   --   --   --   --   ALT 18  --   --   --   --   --   --   --   --   --   ALKPHOS 80  --   --   --   --   --   --   --   --   --   BILITOT 0.9  --   --   --   --   --   --   --   --   --    < > = values in this interval not displayed.   ------------------------------------------------------------------------------------------------------------------ No results for input(s): CHOL, HDL, LDLCALC, TRIG, CHOLHDL, LDLDIRECT in the last 72 hours.  No results found for: HGBA1C ------------------------------------------------------------------------------------------------------------------ No results for input(s): TSH, T4TOTAL, T3FREE, THYROIDAB in the last 72 hours.  Invalid  input(s): FREET3 ------------------------------------------------------------------------------------------------------------------ No results for  input(s): VITAMINB12, FOLATE, FERRITIN, TIBC, IRON, RETICCTPCT in the last 72 hours.  Coagulation profile No results for input(s): INR, PROTIME in the last 168 hours.  No results for input(s): DDIMER in the last 72 hours.  Cardiac Enzymes No results for input(s): CKMB, TROPONINI, MYOGLOBIN in the last 168 hours.  Invalid input(s): CK ------------------------------------------------------------------------------------------------------------------ No results found for: BNP  Micro Results Recent Results (from the past 240 hour(s))  Respiratory Panel by RT PCR (Flu A&B, Covid) - Nasopharyngeal Swab     Status: Abnormal   Collection Time: 02/03/19  7:19 PM   Specimen: Nasopharyngeal Swab  Result Value Ref Range Status   SARS Coronavirus 2 by RT PCR POSITIVE (A) NEGATIVE Final    Comment: RESULT CALLED TO, READ BACK BY AND VERIFIED WITH: T WALKER,RN @2058  02/03/19 MKELLY (NOTE) SARS-CoV-2 target nucleic acids are DETECTED. SARS-CoV-2 RNA is generally detectable in upper respiratory specimens  during the acute phase of infection. Positive results are indicative of the presence of the identified virus, but do not rule out bacterial infection or co-infection with other pathogens not detected by the test. Clinical correlation with patient history and other diagnostic information is necessary to determine patient infection status. The expected result is Negative. Fact Sheet for Patients:  PinkCheek.be Fact Sheet for Healthcare Providers: GravelBags.it This test is not yet approved or cleared by the [place] FDA and  has been authorized for detection and/or diagnosis of SARS-CoV-2 by FDA under an Emergency Use Authorization (EUA).  This EUA will remain in effect (meaning this test can be used) for  the duration of  the COVID-19 declaration under Section 564(b)(1) of the Act, 21 U.S.C. section 360bbb-3(b)(1), unless the  authorization is terminated or revoked sooner.    Influenza A by PCR NEGATIVE NEGATIVE Final   Influenza B by PCR NEGATIVE NEGATIVE Final    Comment: (NOTE) The Xpert Xpress SARS-CoV-2/FLU/RSV assay is intended as an aid in  the diagnosis of influenza from Nasopharyngeal swab specimens and  should not be used as a sole basis for treatment. Nasal washings and  aspirates are unacceptable for Xpert Xpress SARS-CoV-2/FLU/RSV  testing. Fact Sheet for Patients: PinkCheek.be Fact Sheet for Healthcare Providers: GravelBags.it This test is not yet approved or cleared by the [place] FDA and  has been authorized for detection and/or diagnosis of SARS-CoV-2 by  FDA under an Emergency Use Authorization (EUA). This EUA will remain  in effect (meaning this test can be used) for the duration of the  Covid-19 declaration under Section 564(b)(1) of the Act, 21  U.S.C. section 360bbb-3(b)(1), unless the authorization is  terminated or revoked. Performed at [facility], [address] [place], [place] [address]   Surgical PCR screen     Status: None   Collection Time: 02/04/19  1:12 PM   Specimen: Nasal Mucosa; Nasal Swab  Result Value Ref Range Status   MRSA, PCR NEGATIVE NEGATIVE Final   Staphylococcus aureus NEGATIVE NEGATIVE Final    Comment: (NOTE) The Xpert SA Assay (FDA approved for NASAL specimens in patients 67 years of age and older), is one component of a comprehensive surveillance program. It is not intended to diagnose infection nor to guide or monitor treatment. Performed at [facility], [place] [address], [place], [place] [address]     Radiology Reports DG Chest 2 View  Result Date: 02/03/2019 CLINICAL DATA:  Fall, confusion at baseline EXAM: CHEST - 2 VIEW COMPARISON:  In a 01/23/2012,  01/27/2008 FINDINGS: Normal heart size, mediastinal contours, and pulmonary vascularity. Atherosclerotic calcification aorta.  Emphysematous and bronchitic changes consistent with COPD. Interstitial prominence, chronic. No infiltrate, pleural effusion or pneumothorax. Bones demineralized with mark compression deformity of a lower thoracic vertebra, age indeterminate but new since 2010. IMPRESSION: COPD changes without acute infiltrate. Age-indeterminate compression fracture of a lower thoracic vertebra. Electronically Signed   By: Lavonia Dana M.D.   On: 02/03/2019 10:59   DG Pelvis 1-2 Views  Result Date: 02/03/2019 CLINICAL DATA:  Status post fall. EXAM: PELVIS - 1-2 VIEW COMPARISON:  None. FINDINGS: An acute fracture deformity is seen involving the proximal right femoral shaft. This is just below the intertrochanteric region. Lateral angulation of the fracture site is seen. Mild to moderate severity degenerative changes seen involving the bilateral hips. IMPRESSION: Acute fracture of the proximal right femur. Electronically Signed   By: Virgina Norfolk M.D.   On: 02/03/2019 19:11   DG Pelvis 1-2 Views  Result Date: 02/03/2019 CLINICAL DATA:  Fall, baseline confusion, pain in pelvis when moving for imaging EXAM: PELVIS - 1-2 VIEW COMPARISON:  None FINDINGS: Osseous demineralization. RIGHT SI joint poorly profiled. At LEFT SI joint and hip joint spaces preserved. No definite fracture, dislocation, or bone destruction. IMPRESSION: No definite acute osseous abnormalities. Electronically Signed   By: Lavonia Dana M.D.   On: 02/03/2019 11:00   DG Elbow Complete Right  Result Date: 02/03/2019 CLINICAL DATA:  Unwitnessed fall, swelling and bruising RIGHT elbow EXAM: RIGHT ELBOW - COMPLETE 3+ VIEW COMPARISON:  None FINDINGS: Osseous demineralization. Soft tissue swelling overlying the elbow and proximal forearm. Intra-articular olecranon fracture, proximal fragment distracted 2.3 cm. Humerus and radius intact. IMPRESSION: Distracted intra-articular olecranon fracture with significant overlying soft tissue swelling. Electronically  Signed   By: Lavonia Dana M.D.   On: 02/03/2019 10:57   CT Head Wo Contrast  Result Date: 02/03/2019 CLINICAL DATA:  Trauma to the head and neck. EXAM: CT HEAD WITHOUT CONTRAST CT CERVICAL SPINE WITHOUT CONTRAST TECHNIQUE: Multidetector CT imaging of the head and cervical spine was performed following the standard protocol without intravenous contrast. Multiplanar CT image reconstructions of the cervical spine were also generated. COMPARISON:  01/27/2012 FINDINGS: CT HEAD FINDINGS Brain: The brain does not show accelerated generalized atrophy. No focal finding affects the brainstem or cerebellum. 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 Mild chronic small-vessel change of the hemispheric white matter. No hydrocephalus, hemorrhage or extra-axial collection. Vascular: There is atherosclerotic calcification of the major vessels at the base of the brain. Skull: Left pterional craniotomy for previous aneurysm clipping. Otherwise negative. Sinuses/Orbits: Clear/normal Other: None CT CERVICAL SPINE FINDINGS Alignment: Normal except for 2 mm of degenerative anterolisthesis at C4-5. Skull base and vertebrae: No skull base fracture. No cervical spine fracture. Compression fracture at the superior endplate of T1 with loss of height of 10% or less. This is age indeterminate but could be recent. Soft tissues and spinal canal: Negative Disc levels: Chronic spondylosis at C5-6. Mild bilateral bony foraminal narrowing. Chronic facet osteoarthritis on the left at C4-5. Left bony foraminal encroachment because of this. Upper chest: Negative Other: None IMPRESSION: Head CT: No acute or traumatic finding. Previous left pterional craniotomy for aneurysm clipping. Cervical spine CT: No cervical spine fracture. Minimal superior endplate compression fracture at T1 with loss of height of 10%. This is age indeterminate but could possibly be recent. Degenerative  spondylosis at C5-6.  Degenerative facet arthritis on the left at C4-5. Electronically Signed   By: Nelson Chimes M.D.   On: 02/03/2019 11:13   CT Cervical Spine Wo Contrast  Result Date: 02/03/2019 CLINICAL DATA:  Trauma to the head and neck. EXAM: CT HEAD WITHOUT CONTRAST CT CERVICAL SPINE WITHOUT CONTRAST TECHNIQUE: Multidetector CT imaging of the head and cervical spine was performed following the standard protocol without intravenous contrast. Multiplanar CT image reconstructions of the cervical spine were also generated. COMPARISON:  01/27/2012 FINDINGS: CT HEAD FINDINGS Brain: The brain does not show accelerated generalized atrophy. No focal finding affects the brainstem or cerebellum. 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 Mild chronic small-vessel change of the hemispheric white matter. No hydrocephalus, hemorrhage or extra-axial collection. Vascular: There is atherosclerotic calcification of the major vessels at the base of the brain. Skull: Left pterional craniotomy for previous aneurysm clipping. Otherwise negative. Sinuses/Orbits: Clear/normal Other: None CT CERVICAL SPINE FINDINGS Alignment: Normal except for 2 mm of degenerative anterolisthesis at C4-5. Skull base and vertebrae: No skull base fracture. No cervical spine fracture. Compression fracture at the superior endplate of T1 with loss of height of 10% or less. This is age indeterminate but could be recent. Soft tissues and spinal canal: Negative Disc levels: Chronic spondylosis at C5-6. Mild bilateral bony foraminal narrowing. Chronic facet osteoarthritis on the left at C4-5. Left bony foraminal encroachment because of this. Upper chest: Negative Other: None IMPRESSION: Head CT: No acute or traumatic finding. Previous left pterional craniotomy for aneurysm clipping. Cervical spine CT: No cervical spine fracture. Minimal superior endplate compression fracture at T1  with loss of height of 10%. This is age indeterminate but could possibly be recent. Degenerative spondylosis at C5-6. Degenerative facet arthritis on the left at C4-5. Electronically Signed   By: Nelson Chimes M.D.   On: 02/03/2019 11:13   CT Hip Right Wo Contrast  Result Date: 02/03/2019 CLINICAL DATA:  Hip fracture EXAM: CT OF THE RIGHT HIP WITHOUT CONTRAST TECHNIQUE: Multidetector CT imaging of the right hip was performed according to the standard protocol. Multiplanar CT image reconstructions were also generated. COMPARISON:  Femur fracture 02/03/2019 FINDINGS: Bones/Joint/Cartilage The osseous structures appear diffusely demineralized which may limit detection of small or nondisplaced fractures. Comminuted extracapsular right femur fracture with predominantly intertrochanteric extension but with complete separation of the greater and lesser trochanters from both the femoral neck and the subtrochanteric region (AO/OTA A3.3). This is an unstable femur fracture. Hyperdense hemorrhage is noted within the bony trabecula a about the fracture site. The femoral head remains normally located. There are chronic degenerative changes of the acetabuli. Periacetabular spurring and os acetabuli likely reflects sequela of prior glenoid degenerative change. Remaining imaged portions of the pelvic bones are intact. Ligaments Suboptimally assessed by CT. Muscles and Tendons There is soft tissue swelling and edematous changes throughout the musculature of the proximal thigh. No large joint effusion is seen likely as this fracture is extracapsular in nature. Soft tissues Extensive right hip soft tissue swelling and edematous changes. Hemorrhage about the fracture line within the bony trabecula, as detailed above. Included portions of the pelvis demonstrates some atherosclerosis and un inflamed colonic diverticula but are otherwise unremarkable. IMPRESSION: Unstable comminuted extracapsular right femur fracture with predominantly  intertrochanteric extension but with complete separation of the greater and lesser trochanters from both the femoral neck and the subtrochanteric region (AO/OTA A3.3). Hyperdense hemorrhage is noted within the bony  trabecula about the fracture site. No large joint effusion likely as this fracture is extracapsular in nature. Associated right hip soft tissue swelling and edema. More chronic degenerative changes the right Electronically Signed   By: Lovena Le M.D.   On: 02/03/2019 22:47   CT ELBOW RIGHT WO CONTRAST  Result Date: 02/07/2019 CLINICAL DATA:  Right elbow pain status post fall, preop 02/09/2019. EXAM: CT OF THE UPPER RIGHT EXTREMITY WITHOUT CONTRAST TECHNIQUE: Multidetector CT imaging of the upper right extremity was performed according to the standard protocol. COMPARISON:  None. FINDINGS: Bones/Joint/Cartilage Limited evaluation secondary to positioning. Comminuted fracture of the olecranon with 2 cm of distraction. Mild cortical irregularity along the posterior aspect of the capitellum concerning for small marginal osteophyte versus a subtle nondisplaced fracture (image 31/series 4). Large joint effusion. No other acute fracture or dislocation. No aggressive osseous lesion. Ligaments Suboptimally assessed by CT. Muscles and Tendons Muscles are normal. No muscle atrophy. Biceps tendon is intact. Triceps tendon is intact. Soft tissues No fluid collection or hematoma. IMPRESSION: Comminuted fracture of the olecranon with 2 cm of distraction. Mild cortical irregularity along the posterior aspect of the capitellum concerning for small marginal osteophyte versus a subtle nondisplaced fracture (image 31/series 4). Electronically Signed   By: Kathreen Devoid   On: 02/07/2019 16:39   DG CHEST PORT 1 VIEW  Result Date: 02/04/2019 CLINICAL DATA:  Oxygen desaturation. EXAM: PORTABLE CHEST 1 VIEW COMPARISON:  February 03, 2019 FINDINGS: The heart, hila, and mediastinum are normal. No pneumothorax. No nodules or  masses. No focal infiltrates. IMPRESSION: No active disease. Electronically Signed   By: Dorise Bullion III M.D   On: 02/04/2019 19:52   DG C-Arm 1-60 Min  Result Date: 02/04/2019 CLINICAL DATA:  Femur fracture fixation EXAM: DG C-ARM 1-60 MIN; RIGHT FEMUR 2 VIEWS FLUOROSCOPY TIME:  Fluoroscopy Time:  1 minutes 50 seconds Number of Acquired Spot Images: 4 COMPARISON:  None. FINDINGS: Intraoperative radiographs demonstrate trochanteric femoral nail and intramedullary rod fixation proximal right femur shaft fracture. There are 2 distal interlocking screws. Alignment appears improved. IMPRESSION: Fluoroscopic guidance for fixation of right femur fracture with improved alignment Electronically Signed   By: Macy Mis M.D.   On: 02/04/2019 16:51   DG FEMUR, MIN 2 VIEWS RIGHT  Result Date: 02/04/2019 CLINICAL DATA:  Femur fracture fixation EXAM: DG C-ARM 1-60 MIN; RIGHT FEMUR 2 VIEWS FLUOROSCOPY TIME:  Fluoroscopy Time:  1 minutes 50 seconds Number of Acquired Spot Images: 4 COMPARISON:  None. FINDINGS: Intraoperative radiographs demonstrate trochanteric femoral nail and intramedullary rod fixation proximal right femur shaft fracture. There are 2 distal interlocking screws. Alignment appears improved. IMPRESSION: Fluoroscopic guidance for fixation of right femur fracture with improved alignment Electronically Signed   By: Macy Mis M.D.   On: 02/04/2019 16:51   DG FEMUR PORT, MIN 2 VIEWS RIGHT  Result Date: 02/04/2019 CLINICAL DATA:  Postoperative evaluation. EXAM: RIGHT FEMUR PORTABLE 2 VIEW COMPARISON:  None. FINDINGS: A radiopaque intramedullary rod is seen along the length of the right femur. A radiopaque compression screw device is noted within the right femoral head and neck. An inter trochanteric fracture of the proximal right femur is seen with gross anatomic alignment. Soft tissues are unremarkable. IMPRESSION: Status post open reduction and internal fixation of the proximal right femur.  Electronically Signed   By: Virgina Norfolk M.D.   On: 02/04/2019 18:07   DG Femur Portable Min 2 Views Right  Result Date: 02/03/2019 CLINICAL DATA:  Status post fall.  EXAM: RIGHT FEMUR PORTABLE 2 VIEW COMPARISON:  None. FINDINGS: Acute fracture deformity is seen involving shaft of the proximal right femur. This extends to the level just below the intertrochanteric region. Superolateral angulation of the fracture site is seen. There is no evidence of dislocation. IMPRESSION: Acute fracture of the proximal right femur. Electronically Signed   By: Virgina Norfolk M.D.   On: 02/03/2019 19:12

## 2019-02-09 NOTE — Anesthesia Procedure Notes (Signed)
Procedure Name: Intubation Date/Time: 02/09/2019 2:44 PM Performed by: Candis Shine, CRNA Pre-anesthesia Checklist: Patient identified, Emergency Drugs available, Suction available and Patient being monitored Patient Re-evaluated:Patient Re-evaluated prior to induction Oxygen Delivery Method: Circle System Utilized Preoxygenation: Pre-oxygenation with 100% oxygen Induction Type: IV induction Laryngoscope Size: Mac and 3 Grade View: Grade I Tube type: Oral Tube size: 6.5 mm Number of attempts: 1 Airway Equipment and Method: Stylet Placement Confirmation: ETT inserted through vocal cords under direct vision,  positive ETCO2 and breath sounds checked- equal and bilateral Secured at: 21 cm Tube secured with: Tape Dental Injury: Teeth and Oropharynx as per pre-operative assessment

## 2019-02-09 NOTE — Progress Notes (Signed)
Procedure discussed with Daughter, patient marked, consent signed and ready for OR today.

## 2019-02-09 NOTE — Progress Notes (Signed)
Pt am care done, small bm noted, foley emptied, amber urine w/sediment, sufficient amt, cream to inner groin area, foley secured to right thigh, bandages continue on right hip, turned per hip prt/precautions, medicated for pain with good releif noted, right arm/elbow stabilized and raised on pillow, pt has been NPO since midnight as scheduled for surgical intervention on right elbow today around noon. Atb (Ancef) infusing in left arm PIV

## 2019-02-09 NOTE — Progress Notes (Signed)
Pts MEWS addressed this am, no change (acutely) in pts status, pt had rec'd dilaudid IV and this tends to drop her RR and BP, pt is arousable and follows commands, but states pain is much better after medicated. This helping w/turning and positioning pt, which is done q2h. This information address above also relayed to charge nurse, Bing Neighbors.

## 2019-02-09 NOTE — TOC Progression Note (Signed)
Transition of Care [facility]) - Progression Note    Patient Details  Name: [name] MRN: [id number] Date of Birth: [birth year]  [facility]) CM/SW [place], [place] Phone Number: 02/09/2019, 11:11 AM  Clinical Narrative:    CSW spoke with patient's daughter. Patient's daughter is requesting [facility] choice. Will follow-up with patient's daughter once bed availability is confirmed.   Expected Discharge Plan: [place] Barriers to Discharge: Continued Medical Work up  Expected Discharge Plan and Services Expected Discharge Plan: [place] arrangements for the past 2 months: Single Family Home                                       Social Determinants of Health (SDOH) Interventions    Readmission Risk Interventions No flowsheet data found.

## 2019-02-09 NOTE — H&P (View-Only) (Signed)
Procedure discussed with Daughter, patient marked, consent signed and ready for OR today.

## 2019-02-10 ENCOUNTER — Encounter: Payer: Self-pay | Admitting: *Deleted

## 2019-02-10 LAB — D-DIMER, QUANTITATIVE: D-Dimer, Quant: 4.37 ug/mL-FEU — ABNORMAL HIGH (ref 0.00–0.50)

## 2019-02-10 LAB — COMPREHENSIVE METABOLIC PANEL
ALT: 22 U/L (ref 0–44)
AST: 51 U/L — ABNORMAL HIGH (ref 15–41)
Albumin: 2.6 g/dL — ABNORMAL LOW (ref 3.5–5.0)
Alkaline Phosphatase: 59 U/L (ref 38–126)
Anion gap: 10 (ref 5–15)
BUN: 22 mg/dL (ref 8–23)
CO2: 25 mmol/L (ref 22–32)
Calcium: 8.5 mg/dL — ABNORMAL LOW (ref 8.9–10.3)
Chloride: 103 mmol/L (ref 98–111)
Creatinine, Ser: 0.73 mg/dL (ref 0.44–1.00)
GFR calc Af Amer: >60 mL/min (ref >60)
GFR calc non Af Amer: >60 mL/min (ref >60)
Glucose, Bld: 125 mg/dL — ABNORMAL HIGH (ref 70–99)
Potassium: 4.3 mmol/L (ref 3.5–5.1)
Sodium: 138 mmol/L (ref 135–145)
Total Bilirubin: 1.1 mg/dL (ref 0.3–1.2)
Total Protein: 5.6 g/dL — ABNORMAL LOW (ref 6.5–8.1)

## 2019-02-10 LAB — CBC
HCT: 28.8 % — ABNORMAL LOW (ref 36.0–46.0)
Hemoglobin: 9.2 g/dL — ABNORMAL LOW (ref 12.0–15.0)
MCH: 32.5 pg (ref 26.0–34.0)
MCHC: 31.9 g/dL (ref 30.0–36.0)
MCV: 101.8 fL — ABNORMAL HIGH (ref 80.0–100.0)
Platelets: 457 10*3/uL — ABNORMAL HIGH (ref 150–400)
RBC: 2.83 MIL/uL — ABNORMAL LOW (ref 3.87–5.11)
RDW: 14 % (ref 11.5–15.5)
WBC: 12.6 10*3/uL — ABNORMAL HIGH (ref 4.0–10.5)
nRBC: 0 % (ref 0.0–0.2)

## 2019-02-10 LAB — FERRITIN: Ferritin: 295 ng/mL (ref 11–307)

## 2019-02-10 LAB — C-REACTIVE PROTEIN: CRP: 8 mg/dL — ABNORMAL HIGH (ref <1.0)

## 2019-02-10 MED ORDER — SODIUM CHLORIDE 0.9 % IV BOLUS
500.0000 mL | Freq: Once | INTRAVENOUS | Status: AC
  Administered 2019-02-10: 500 mL via INTRAVENOUS

## 2019-02-10 MED ORDER — ORAL CARE MOUTH RINSE
15.0000 mL | Freq: Two times a day (BID) | OROMUCOSAL | Status: DC
  Administered 2019-02-10 – 2019-02-14 (×6): 15 mL via OROMUCOSAL

## 2019-02-10 NOTE — TOC Progression Note (Signed)
Transition of Care [facility]) - Progression Note    Patient Details  Name: [name] MRN: [id number] Date of Birth: 11/27/1934  Transition of Care [facility]) CM/SW [place], [place] Phone Number: 02/10/2019, 3:51 PM  Clinical Narrative:    [facility] spoke with [facility] to see if they could offer a bed to patient Friday or Saturday when stable. They request CSW touch base tomorrow to determine if beds available.    Expected Discharge Plan: [place] Barriers to Discharge: Continued Medical Work up  Expected Discharge Plan and Services Expected Discharge Plan: [place] arrangements for the past 2 months: Single Family Home                                       Social Determinants of Health (SDOH) Interventions    Readmission Risk Interventions No flowsheet data found.

## 2019-02-10 NOTE — Progress Notes (Signed)
@  1130 patient has not urinated 4 hrs post removal of foley. Bladder scan perform and obtained 6 ml of urine; Dr Sloan Leiter informed and advised to re-scan in few hrs. - Pt . Was encourage to drink fluids.  @1430  Still no urine output & with low PO intake, pt. currently sitting on the chair and refusing to transfer back to bed for a more accurate scan.  - Bladder scan shows 143 ml of urine, informed Dr. Sloan Leiter and advise to offer [facility] and to do a straight cath only if the volume is close to 374ml. - Offered to use the [facility] but pt. Refused. - Will repeat scan in few hrs - no further interventions at this time.

## 2019-02-10 NOTE — Progress Notes (Signed)
PROGRESS NOTE                                                                                                                                                                                                             Patient Demographics:    [name], is a 85 y.o. female, DOB - [birth year], [id number]  Outpatient Primary MD for the patient is [name], MD   Admit date - 02/03/2019   LOS - 7  Chief Complaint  Patient presents with  . Leg Pain       Brief Narrative: Patient is a 85 y.o. 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  See below for further details.   Subjective:   Confused-but comfortable-no major events overnight per RN.   Assessment  & Plan :   COVID-19 infection-no respiratory symptoms-on room air this morning.  Chest x-ray on 1/29 without pneumonia.  CRP elevated-but patient is s/p hip and olecranon fracture repair.  Fever: afebrile  O2 requirements:  SpO2: 96 % O2 Flow Rate (L/min): 2 L/min   COVID-19 Labs: Recent Labs    02/10/19 0231  DDIMER 4.37*  FERRITIN 295  CRP 8.0*    No results found for: BNP  No results for input(s): PROCALCITON in the last 168 hours.  Lab Results  Component Value Date   SARSCOV2NAA POSITIVE (A) 02/03/2019     COVID-19 Medications: None  Prone/Incentive Spirometry: encouraged  incentive spirometry use 3-4/hour.  DVT Prophylaxis  :  Lovenox   Right proximal femur fracture: S/p right cephalomedullary nail on 1/29.  Orthopedics following-recommendations are for WBAT RLE-Lovenox 40 mg daily x6 weeks.  Comminuted fracture of the right olecranon: S/p ORIF on 2/3-orthopedics following.  NWB to RUE while in splint for 2 weeks, ROM to start  gently after that.  Acute blood loss anemia from perioperative blood loss: Hemoglobin stable-s/p 1 unit of PRBC so far.  Follow.  Dementia: At risk for delirium-currently pleasantly confused-maintain delirium precautions.  Other issues: Has Foley catheter-apparently was placed a few days back due to acute urinary retention-today-on Flomax-we will need to see if patient is started voiding spontaneously.   Nutrition Problem: Nutrition Problem:  Increased nutrient needs Etiology: post-op healing Signs/Symptoms: estimated needs Interventions: MVI, Ensure Enlive (each supplement provides 350kcal and 20 grams of protein), Magic cup     Consults  :  Orthopedics  Procedures  :  1/29>> right cephalomedullary nail-right hip 2/3>> ORIF-right olecranon  ABG: No results found for: PHART, PCO2ART, PO2ART, HCO3, TCO2, ACIDBASEDEF, O2SAT  Condition -  Guarded  Family Communication  :  Daughter updated over the phone 2/4  Code Status :DNR  Diet :  Diet Order            Diet regular Room service appropriate? Yes; Fluid consistency: Thin  Diet effective now               Disposition Plan  :  SNF on discharge  Barriers to discharge: Scheduled for ORIF right wrist on 2/3-following which-hospital course needs to be followed to determine optimal timing of discharge.  Antimicorbials  :    Anti-infectives (From admission, onward)   Start     Dose/Rate Route Frequency Ordered Stop   02/09/19 1830  ceFAZolin (ANCEF) IVPB 1 g/50 mL premix     1 g 100 mL/hr over 30 Minutes Intravenous Every 6 hours 02/09/19 1829 02/10/19 0910   02/09/19 1535  vancomycin (VANCOCIN) powder  Status:  Discontinued       As needed 02/09/19 1617 02/09/19 1620   02/09/19 0600  ceFAZolin (ANCEF) IVPB 2g/100 mL premix     2 g 200 mL/hr over 30 Minutes Intravenous On call to O.R. 02/04/19 1802 02/09/19 0623   02/07/19 1000  ceFAZolin (ANCEF) IVPB 2g/100 mL premix     2 g 200 mL/hr over 30 Minutes Intravenous To  Short Stay 02/07/19 0800 02/08/19 1000   02/04/19 2200  ceFAZolin (ANCEF) IVPB 2g/100 mL premix     2 g 200 mL/hr over 30 Minutes Intravenous Every 8 hours 02/04/19 1802 02/05/19 0727      Inpatient Medications  Scheduled Meds: . acetaminophen  650 mg Oral TID  . vitamin C  500 mg Oral BID  . Chlorhexidine Gluconate Cloth  6 each Topical Daily  . docusate sodium  100 mg Oral BID  . enoxaparin (LOVENOX) injection  40 mg Subcutaneous Q24H  . feeding supplement (ENSURE ENLIVE)  237 mL Oral BID BM  . ferrous sulfate  325 mg Oral BID WC  . mouth rinse  15 mL Mouth Rinse BID  . multivitamin with minerals  1 tablet Oral Daily  . senna-docusate  2 tablet Oral BID  . tamsulosin  0.4 mg Oral Daily  . cholecalciferol  1,000 Units Oral Daily  . zinc sulfate  220 mg Oral Daily   Continuous Infusions: . sodium chloride 10 mL/hr at 02/10/19 0349   PRN Meds:.sodium chloride, bisacodyl, HYDROmorphone (DILAUDID) injection, lip balm, magnesium citrate, menthol-cetylpyridinium **OR** phenol, ondansetron **OR** ondansetron (ZOFRAN) IV, oxyCODONE, polyethylene glycol   Time Spent in minutes  25  See all Orders from today for further details   Oren Binet M.D on 02/10/2019 at 1:43 PM  To page go to [email or web address] - use universal password  [facility] -  Office  [phone]    Objective:   Vitals:   02/10/19 1306 02/10/19 1307 02/10/19 1309 02/10/19 1318  BP: (!) 94/58   (!) 102/53  Pulse: 82 88 86   Resp: (!) 23 (!) 21 19   Temp: 97.6 F (36.4 C)     TempSrc: Oral     SpO2: 96%     Weight:  Height:        Wt Readings from Last 3 Encounters:  02/03/19 41 kg  02/03/19 41.7 kg  09/06/18 40.4 kg     Intake/Output Summary (Last 24 hours) at 02/10/2019 1343 Last data filed at 02/10/2019 0700 Gross per 24 hour  Intake 1162.59 ml  Output 920 ml  Net 242.59 ml     Physical Exam Gen Exam:Pleasantly confused-not in distress HEENT:atraumatic, normocephalic Chest: B/L  clear to auscultation anteriorly CVS:S1S2 regular Abdomen:soft non tender, non distended Extremities:no edema Neurology: Moving all 4 ext Skin: no rash   Data Review:    CBC Recent Labs  Lab 02/05/19 0352 02/06/19 0140 02/07/19 0222 02/08/19 1147 02/10/19 0231  WBC 8.3 8.1 8.5 8.3 12.6*  HGB 7.6* 6.7* 8.7* 9.4* 9.2*  HCT 22.9* 20.1* 25.6* 28.7* 28.8*  PLT 231 295 307 437* 457*  MCV 96.6 96.2 94.8 97.0 101.8*  MCH 32.1 32.1 32.2 31.8 32.5  MCHC 33.2 33.3 34.0 32.8 31.9  RDW 12.6 12.3 13.2 13.5 14.0  LYMPHSABS  --   --   --  1.3  --   MONOABS  --   --   --  1.0  --   EOSABS  --   --   --  0.1  --   BASOSABS  --   --   --  0.0  --     Chemistries  Recent Labs  Lab 02/05/19 0352 02/06/19 0140 02/06/19 0614 02/07/19 0222 02/08/19 1147 02/10/19 0231  NA 135 137  --  138 141 138  K 3.8 3.4*  --  3.7 3.5 4.3  CL 102 102  --  104 103 103  CO2 23 25  --  26 26 25   GLUCOSE 115* 91  --  116* 127* 125*  BUN 15 12  --  19 16 22   CREATININE 0.75 0.70  --  0.50 0.63 0.73  CALCIUM 8.3* 8.1*  --  8.2* 8.9 8.5*  MG  --   --  2.1  --   --   --   AST  --   --   --   --   --  51*  ALT  --   --   --   --   --  22  ALKPHOS  --   --   --   --   --  59  BILITOT  --   --   --   --   --  1.1   ------------------------------------------------------------------------------------------------------------------ No results for input(s): CHOL, HDL, LDLCALC, TRIG, CHOLHDL, LDLDIRECT in the last 72 hours.  No results found for: HGBA1C ------------------------------------------------------------------------------------------------------------------ No results for input(s): TSH, T4TOTAL, T3FREE, THYROIDAB in the last 72 hours.  Invalid input(s): FREET3 ------------------------------------------------------------------------------------------------------------------ Recent Labs    02/10/19 0231  FERRITIN 295    Coagulation profile No results for input(s): INR, PROTIME in the last 168  hours.  Recent Labs    02/10/19 0231  DDIMER 4.37*    Cardiac Enzymes No results for input(s): CKMB, TROPONINI, MYOGLOBIN in the last 168 hours.  Invalid input(s): CK ------------------------------------------------------------------------------------------------------------------ No results found for: BNP  Micro Results Recent Results (from the past 240 hour(s))  Respiratory Panel by RT PCR (Flu A&B, Covid) - Nasopharyngeal Swab     Status: Abnormal   Collection Time: 02/03/19  7:19 PM   Specimen: Nasopharyngeal Swab  Result Value Ref Range Status   SARS Coronavirus 2 by RT PCR POSITIVE (A) NEGATIVE Final    Comment: RESULT CALLED TO,  READ BACK BY AND VERIFIED WITH: Andres Ege @2058  02/03/19 MKELLY (NOTE) SARS-CoV-2 target nucleic acids are DETECTED. SARS-CoV-2 RNA is generally detectable in upper respiratory specimens  during the acute phase of infection. Positive results are indicative of the presence of the identified virus, but do not rule out bacterial infection or co-infection with other pathogens not detected by the test. Clinical correlation with patient history and other diagnostic information is necessary to determine patient infection status. The expected result is Negative. Fact Sheet for Patients:  PinkCheek.be Fact Sheet for Healthcare Providers: GravelBags.it This test is not yet approved or cleared by the [place] FDA and  has been authorized for detection and/or diagnosis of SARS-CoV-2 by FDA under an Emergency Use Authorization (EUA).  This EUA will remain in effect (meaning this test can be used) for  the duration of  the COVID-19 declaration under Section 564(b)(1) of the Act, 21 U.S.C. section 360bbb-3(b)(1), unless the authorization is terminated or revoked sooner.    Influenza A by PCR NEGATIVE NEGATIVE Final   Influenza B by PCR NEGATIVE NEGATIVE Final    Comment: (NOTE) The Xpert  Xpress SARS-CoV-2/FLU/RSV assay is intended as an aid in  the diagnosis of influenza from Nasopharyngeal swab specimens and  should not be used as a sole basis for treatment. Nasal washings and  aspirates are unacceptable for Xpert Xpress SARS-CoV-2/FLU/RSV  testing. Fact Sheet for Patients: PinkCheek.be Fact Sheet for Healthcare Providers: GravelBags.it This test is not yet approved or cleared by the [place] FDA and  has been authorized for detection and/or diagnosis of SARS-CoV-2 by  FDA under an Emergency Use Authorization (EUA). This EUA will remain  in effect (meaning this test can be used) for the duration of the  Covid-19 declaration under Section 564(b)(1) of the Act, 21  U.S.C. section 360bbb-3(b)(1), unless the authorization is  terminated or revoked. Performed at [facility], [address] [place], [place] [address]   Surgical PCR screen     Status: None   Collection Time: 02/04/19  1:12 PM   Specimen: Nasal Mucosa; Nasal Swab  Result Value Ref Range Status   MRSA, PCR NEGATIVE NEGATIVE Final   Staphylococcus aureus NEGATIVE NEGATIVE Final    Comment: (NOTE) The Xpert SA Assay (FDA approved for NASAL specimens in patients 17 years of age and older), is one component of a comprehensive surveillance program. It is not intended to diagnose infection nor to guide or monitor treatment. Performed at [facility], [place] [address] [place], [place] [address]     Radiology Reports DG Chest 2 View  Result Date: 02/03/2019 CLINICAL DATA:  Fall, confusion at baseline EXAM: CHEST - 2 VIEW COMPARISON:  In a 01/23/2012, 01/27/2008 FINDINGS: Normal heart size, mediastinal contours, and pulmonary vascularity. Atherosclerotic calcification aorta. Emphysematous and bronchitic changes consistent with COPD. Interstitial prominence, chronic. No infiltrate, pleural effusion or pneumothorax. Bones demineralized with mark  compression deformity of a lower thoracic vertebra, age indeterminate but new since 2010. IMPRESSION: COPD changes without acute infiltrate. Age-indeterminate compression fracture of a lower thoracic vertebra. Electronically Signed   By: Lavonia Dana M.D.   On: 02/03/2019 10:59   DG Pelvis 1-2 Views  Result Date: 02/03/2019 CLINICAL DATA:  Status post fall. EXAM: PELVIS - 1-2 VIEW COMPARISON:  None. FINDINGS: An acute fracture deformity is seen involving the proximal right femoral shaft. This is just below the intertrochanteric region. Lateral angulation of the fracture site is seen. Mild to moderate severity degenerative changes seen involving  the bilateral hips. IMPRESSION: Acute fracture of the proximal right femur. Electronically Signed   By: Virgina Norfolk M.D.   On: 02/03/2019 19:11   DG Pelvis 1-2 Views  Result Date: 02/03/2019 CLINICAL DATA:  Fall, baseline confusion, pain in pelvis when moving for imaging EXAM: PELVIS - 1-2 VIEW COMPARISON:  None FINDINGS: Osseous demineralization. RIGHT SI joint poorly profiled. At LEFT SI joint and hip joint spaces preserved. No definite fracture, dislocation, or bone destruction. IMPRESSION: No definite acute osseous abnormalities. Electronically Signed   By: Lavonia Dana M.D.   On: 02/03/2019 11:00   DG Elbow 2 Views Right  Result Date: 02/09/2019 CLINICAL DATA:  Postoperative evaluation. EXAM: RIGHT ELBOW - 2 VIEW COMPARISON:  February 03, 2019 FINDINGS: It should be noted that the right elbow was imaged in a plaster cast with subsequently obscured osseous and soft tissue detail. A large radiopaque fixation screw is seen within the proximal right ulna. An acute fracture is not identified. There is no evidence of arthropathy or other focal bone abnormality. Soft tissues are unremarkable. IMPRESSION: Prior open reduction internal fixation of the proximal right ulna. Electronically Signed   By: Virgina Norfolk M.D.   On: 02/09/2019 18:58   DG Elbow Complete  Right  Result Date: 02/03/2019 CLINICAL DATA:  Unwitnessed fall, swelling and bruising RIGHT elbow EXAM: RIGHT ELBOW - COMPLETE 3+ VIEW COMPARISON:  None FINDINGS: Osseous demineralization. Soft tissue swelling overlying the elbow and proximal forearm. Intra-articular olecranon fracture, proximal fragment distracted 2.3 cm. Humerus and radius intact. IMPRESSION: Distracted intra-articular olecranon fracture with significant overlying soft tissue swelling. Electronically Signed   By: Lavonia Dana M.D.   On: 02/03/2019 10:57   CT Head Wo Contrast  Result Date: 02/03/2019 CLINICAL DATA:  Trauma to the head and neck. EXAM: CT HEAD WITHOUT CONTRAST CT CERVICAL SPINE WITHOUT CONTRAST TECHNIQUE: Multidetector CT imaging of the head and cervical spine was performed following the standard protocol without intravenous contrast. Multiplanar CT image reconstructions of the cervical spine were also generated. COMPARISON:  01/27/2012 FINDINGS: CT HEAD FINDINGS Brain: The brain does not show accelerated generalized atrophy. No focal finding affects the brainstem or cerebellum. 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 Mild chronic small-vessel change of the hemispheric white matter. No hydrocephalus, hemorrhage or extra-axial collection. Vascular: There is atherosclerotic calcification of the major vessels at the base of the brain. Skull: Left pterional craniotomy for previous aneurysm clipping. Otherwise negative. Sinuses/Orbits: Clear/normal Other: None CT CERVICAL SPINE FINDINGS Alignment: Normal except for 2 mm of degenerative anterolisthesis at C4-5. Skull base and vertebrae: No skull base fracture. No cervical spine fracture. Compression fracture at the superior endplate of T1 with loss of height of 10% or less. This is age indeterminate but could be recent. Soft tissues and spinal canal: Negative Disc levels: Chronic spondylosis at C5-6. Mild  bilateral bony foraminal narrowing. Chronic facet osteoarthritis on the left at C4-5. Left bony foraminal encroachment because of this. Upper chest: Negative Other: None IMPRESSION: Head CT: No acute or traumatic finding. Previous left pterional craniotomy for aneurysm clipping. Cervical spine CT: No cervical spine fracture. Minimal superior endplate compression fracture at T1 with loss of height of 10%. This is age indeterminate but could possibly be recent. Degenerative spondylosis at C5-6. Degenerative facet arthritis on the left at C4-5. Electronically Signed   By: Nelson Chimes M.D.   On: 02/03/2019 11:13   CT Cervical Spine  Wo Contrast  Result Date: 02/03/2019 CLINICAL DATA:  Trauma to the head and neck. EXAM: CT HEAD WITHOUT CONTRAST CT CERVICAL SPINE WITHOUT CONTRAST TECHNIQUE: Multidetector CT imaging of the head and cervical spine was performed following the standard protocol without intravenous contrast. Multiplanar CT image reconstructions of the cervical spine were also generated. COMPARISON:  01/27/2012 FINDINGS: CT HEAD FINDINGS Brain: The brain does not show accelerated generalized atrophy. No focal finding affects the brainstem or cerebellum. 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 Mild chronic small-vessel change of the hemispheric white matter. No hydrocephalus, hemorrhage or extra-axial collection. Vascular: There is atherosclerotic calcification of the major vessels at the base of the brain. Skull: Left pterional craniotomy for previous aneurysm clipping. Otherwise negative. Sinuses/Orbits: Clear/normal Other: None CT CERVICAL SPINE FINDINGS Alignment: Normal except for 2 mm of degenerative anterolisthesis at C4-5. Skull base and vertebrae: No skull base fracture. No cervical spine fracture. Compression fracture at the superior endplate of T1 with loss of height of 10% or less. This is age indeterminate but could  be recent. Soft tissues and spinal canal: Negative Disc levels: Chronic spondylosis at C5-6. Mild bilateral bony foraminal narrowing. Chronic facet osteoarthritis on the left at C4-5. Left bony foraminal encroachment because of this. Upper chest: Negative Other: None IMPRESSION: Head CT: No acute or traumatic finding. Previous left pterional craniotomy for aneurysm clipping. Cervical spine CT: No cervical spine fracture. Minimal superior endplate compression fracture at T1 with loss of height of 10%. This is age indeterminate but could possibly be recent. Degenerative spondylosis at C5-6. Degenerative facet arthritis on the left at C4-5. Electronically Signed   By: Nelson Chimes M.D.   On: 02/03/2019 11:13   CT Hip Right Wo Contrast  Result Date: 02/03/2019 CLINICAL DATA:  Hip fracture EXAM: CT OF THE RIGHT HIP WITHOUT CONTRAST TECHNIQUE: Multidetector CT imaging of the right hip was performed according to the standard protocol. Multiplanar CT image reconstructions were also generated. COMPARISON:  Femur fracture 02/03/2019 FINDINGS: Bones/Joint/Cartilage The osseous structures appear diffusely demineralized which may limit detection of small or nondisplaced fractures. Comminuted extracapsular right femur fracture with predominantly intertrochanteric extension but with complete separation of the greater and lesser trochanters from both the femoral neck and the subtrochanteric region (AO/OTA A3.3). This is an unstable femur fracture. Hyperdense hemorrhage is noted within the bony trabecula a about the fracture site. The femoral head remains normally located. There are chronic degenerative changes of the acetabuli. Periacetabular spurring and os acetabuli likely reflects sequela of prior glenoid degenerative change. Remaining imaged portions of the pelvic bones are intact. Ligaments Suboptimally assessed by CT. Muscles and Tendons There is soft tissue swelling and edematous changes throughout the musculature of the  proximal thigh. No large joint effusion is seen likely as this fracture is extracapsular in nature. Soft tissues Extensive right hip soft tissue swelling and edematous changes. Hemorrhage about the fracture line within the bony trabecula, as detailed above. Included portions of the pelvis demonstrates some atherosclerosis and un inflamed colonic diverticula but are otherwise unremarkable. IMPRESSION: Unstable comminuted extracapsular right femur fracture with predominantly intertrochanteric extension but with complete separation of the greater and lesser trochanters from both the femoral neck and the subtrochanteric region (AO/OTA A3.3). Hyperdense hemorrhage is noted within the bony trabecula about the fracture site. No large joint effusion likely as this fracture is extracapsular in nature. Associated right hip soft tissue swelling and edema. More chronic  degenerative changes the right Electronically Signed   By: Lovena Le M.D.   On: 02/03/2019 22:47   CT ELBOW RIGHT WO CONTRAST  Result Date: 02/07/2019 CLINICAL DATA:  Right elbow pain status post fall, preop 02/09/2019. EXAM: CT OF THE UPPER RIGHT EXTREMITY WITHOUT CONTRAST TECHNIQUE: Multidetector CT imaging of the upper right extremity was performed according to the standard protocol. COMPARISON:  None. FINDINGS: Bones/Joint/Cartilage Limited evaluation secondary to positioning. Comminuted fracture of the olecranon with 2 cm of distraction. Mild cortical irregularity along the posterior aspect of the capitellum concerning for small marginal osteophyte versus a subtle nondisplaced fracture (image 31/series 4). Large joint effusion. No other acute fracture or dislocation. No aggressive osseous lesion. Ligaments Suboptimally assessed by CT. Muscles and Tendons Muscles are normal. No muscle atrophy. Biceps tendon is intact. Triceps tendon is intact. Soft tissues No fluid collection or hematoma. IMPRESSION: Comminuted fracture of the olecranon with 2 cm of  distraction. Mild cortical irregularity along the posterior aspect of the capitellum concerning for small marginal osteophyte versus a subtle nondisplaced fracture (image 31/series 4). Electronically Signed   By: Kathreen Devoid   On: 02/07/2019 16:39   DG CHEST PORT 1 VIEW  Result Date: 02/04/2019 CLINICAL DATA:  Oxygen desaturation. EXAM: PORTABLE CHEST 1 VIEW COMPARISON:  February 03, 2019 FINDINGS: The heart, hila, and mediastinum are normal. No pneumothorax. No nodules or masses. No focal infiltrates. IMPRESSION: No active disease. Electronically Signed   By: Dorise Bullion III M.D   On: 02/04/2019 19:52   DG C-Arm 1-60 Min  Result Date: 02/04/2019 CLINICAL DATA:  Femur fracture fixation EXAM: DG C-ARM 1-60 MIN; RIGHT FEMUR 2 VIEWS FLUOROSCOPY TIME:  Fluoroscopy Time:  1 minutes 50 seconds Number of Acquired Spot Images: 4 COMPARISON:  None. FINDINGS: Intraoperative radiographs demonstrate trochanteric femoral nail and intramedullary rod fixation proximal right femur shaft fracture. There are 2 distal interlocking screws. Alignment appears improved. IMPRESSION: Fluoroscopic guidance for fixation of right femur fracture with improved alignment Electronically Signed   By: Macy Mis M.D.   On: 02/04/2019 16:51   DG FEMUR, MIN 2 VIEWS RIGHT  Result Date: 02/04/2019 CLINICAL DATA:  Femur fracture fixation EXAM: DG C-ARM 1-60 MIN; RIGHT FEMUR 2 VIEWS FLUOROSCOPY TIME:  Fluoroscopy Time:  1 minutes 50 seconds Number of Acquired Spot Images: 4 COMPARISON:  None. FINDINGS: Intraoperative radiographs demonstrate trochanteric femoral nail and intramedullary rod fixation proximal right femur shaft fracture. There are 2 distal interlocking screws. Alignment appears improved. IMPRESSION: Fluoroscopic guidance for fixation of right femur fracture with improved alignment Electronically Signed   By: Macy Mis M.D.   On: 02/04/2019 16:51   DG FEMUR PORT, MIN 2 VIEWS RIGHT  Result Date: 02/04/2019 CLINICAL  DATA:  Postoperative evaluation. EXAM: RIGHT FEMUR PORTABLE 2 VIEW COMPARISON:  None. FINDINGS: A radiopaque intramedullary rod is seen along the length of the right femur. A radiopaque compression screw device is noted within the right femoral head and neck. An inter trochanteric fracture of the proximal right femur is seen with gross anatomic alignment. Soft tissues are unremarkable. IMPRESSION: Status post open reduction and internal fixation of the proximal right femur. Electronically Signed   By: Virgina Norfolk M.D.   On: 02/04/2019 18:07   DG Femur Portable Min 2 Views Right  Result Date: 02/03/2019 CLINICAL DATA:  Status post fall. EXAM: RIGHT FEMUR PORTABLE 2 VIEW COMPARISON:  None. FINDINGS: Acute fracture deformity is seen involving shaft of the proximal right femur. This extends to the level  just below the intertrochanteric region. Superolateral angulation of the fracture site is seen. There is no evidence of dislocation. IMPRESSION: Acute fracture of the proximal right femur. Electronically Signed   By: Virgina Norfolk M.D.   On: 02/03/2019 19:12

## 2019-02-10 NOTE — Progress Notes (Signed)
Physical Therapy Treatment Patient Details Name: [name] MRN: [id number] DOB: [birth year] Today's Date: 02/10/2019    History of Present Illness Pt adm after fall with displaced segmental fracture of the rt femur. Pt found to be Covid+. Pt had fall earlier in the day suffering a displaced olecranon fx on the rt and was splinted and dc'd home from the ED before suffering second fall. Pt underwent ORIF of rt femur on 1/29. Pt to have operative repair of rt olecranon at later date. PMH - dementia, craniotomy for aneurysm clipping.    PT Comments    Pt admitted with above diagnosis. Pt was able to sit EOB with min guard assist to min assist for 10 minutes. Able to stand to [facility] with max to total assist of 2.  Pt confusion limits progress. Pt appeared to be hallucinating as well.   Pt currently with functional limitations due to balance and endurance deficits. Pt will benefit from skilled PT to increase their independence and safety with mobility to allow discharge to the venue listed below.     Follow Up Recommendations  SNF;Supervision/Assistance - 24 hour     Equipment Recommendations  Other (comment)(To be determined)    Recommendations for Other Services       Precautions / Restrictions Precautions Precautions: Fall;Other (comment) Precaution Comments: NWB RUE, NO ROM, splint - surgery for 2/3 and MD says likely status will not change after surgery.  WBAT RLE Required Braces or Orthoses: Splint/Cast Splint/Cast: RUE Restrictions Weight Bearing Restrictions: Yes RUE Weight Bearing: Non weight bearing RLE Weight Bearing: Weight bearing as tolerated Other Position/Activity Restrictions: NO ROM to RUE    Mobility  Bed Mobility Overal bed mobility: Needs Assistance Bed Mobility: Rolling;Supine to Sit;Sit to Supine Rolling: Max assist;+2 for physical assistance;+2 for safety/equipment;Total assist   Supine to sit: Max assist;Mod assist;+2 for physical assistance      General bed mobility comments: Assist with all aspects due to pain in RLE. Did use helicopter technqiue with use of pad to get pt to EOB. Then pt pulled up on PT and was actively assisting to sit up.   Transfers Overall transfer level: Needs assistance   Transfers: Sit to/from Stand;Stand Pivot Transfers Sit to Stand: +2 physical assistance;From elevated surface;Max assist Stand pivot transfers: Total assist;+2 safety/equipment       General transfer comment: max A+2 to attempt to stand at [facility], pt with increased pain with attempts to place weight through RLE.  Pt did stand fully upright however for a brief time but was able to stand on feet and used the left UE to pull up on STedy.  Pt sat on St[place] and was moved via [place] to the chair.   Ambulation/Gait                 Stairs             Wheelchair Mobility    Modified Rankin (Stroke Patients Only)       Balance Overall balance assessment: Needs assistance;History of Falls Sitting-balance support: Feet supported;Single extremity supported Sitting balance-Leahy Scale: Fair Sitting balance - Comments: minguard-minA for static sitting balance sitting 10 min and did some LE exercise. Tends to lean right, requires max multimodal cues to correct and only able to maintain midline for short periods of time  Postural control: Right lateral lean;Posterior lean Standing balance support: Single extremity supported;During functional activity Standing balance-Leahy Scale: Zero Standing balance comment: Needs max to total assist to come to partial stand.  Cognition Arousal/Alertness: Awake/alert Behavior During Therapy: WFL for tasks assessed/performed Overall Cognitive Status: History of cognitive impairments - at baseline                                 General Comments: pt with history of dementia, pleasantly confused during session, oriented to name and able to state  DOB, required frequent reminders about her fall/hip injury . Pt not oriented to place, time or situation      Exercises General Exercises - Lower Extremity Quad Sets: AROM;Both;5 reps;Supine Long Arc Quad: AAROM;Both;5 reps;Seated Other Exercises Other Exercises: functional use of LUE for overhead reach.  Pt NWB, NO ROM and splint for RUE.  Surgery tomorrow and MD states status unlikely to change post op    General Comments General comments (skin integrity, edema, etc.): VSS      Pertinent Vitals/Pain Pain Assessment: Faces Faces Pain Scale: Hurts whole lot Pain Location: R hip with certain movements Pain Descriptors / Indicators: Discomfort;Grimacing;Sore Pain Intervention(s): Limited activity within patient's tolerance;Monitored during session;Repositioned    Home Living                      Prior Function            PT Goals (current goals can now be found in the care plan section) Progress towards PT goals: Progressing toward goals    Frequency    Min 3X/week      PT Plan Current plan remains appropriate    Co-evaluation              AM-PAC PT "6 Clicks" Mobility   Outcome Measure  Help needed turning from your back to your side while in a flat bed without using bedrails?: Total Help needed moving from lying on your back to sitting on the side of a flat bed without using bedrails?: A Lot Help needed moving to and from a bed to a chair (including a wheelchair)?: Total Help needed standing up from a chair using your arms (e.g., wheelchair or bedside chair)?: Total Help needed to walk in hospital room?: Total Help needed climbing 3-5 steps with a railing? : Total 6 Click Score: 7    End of Session Equipment Utilized During Treatment: Gait belt Activity Tolerance: Patient limited by pain;Patient limited by fatigue Patient left: with call bell/phone within reach;in chair;with chair alarm set Nurse Communication: Mobility status PT Visit  Diagnosis: Other abnormalities of gait and mobility (R26.89);History of falling (Z91.81);Muscle weakness (generalized) (M62.81);Pain Pain - Right/Left: Right Pain - part of body: Hip;Leg     Time: [id number] PT Time Calculation (min) (ACUTE ONLY): 17 min  Charges:  $Therapeutic Activity: 8-22 mins                     Ruffin Lada W,PT Acute Rehabilitation Services Pager:  [phone]  Office:  [place] 02/10/2019, 1:03 PM

## 2019-02-10 NOTE — Discharge Instructions (Signed)
Ophelia Charter MD, MPH [name], PA-C [place] [address] [phone] (tel)   [phone] (fax)   POST-OPERATIVE INSTRUCTIONS  WOUND CARE - Right leg - You may shower on Post-Op Day #2.  - You may leave steri-strips in place.  - Keep incision clean and dry.  - Dry dressing can be placed as you see fit if there is any drainage.  EXERCISES - Right leg - You may bear weight on your operative leg. - Follow the instructions of your therapist.  No specific exercises necessary outside of this.  WOUND CARE - Right arm ? Please keep splint clean dry and intact until followup.  ? You may shower on Post-Op Day #2.  ? You must keep splint dry during this process and may find that a plastic bag taped around the extremity or alternatively a towel based bath may be a better option.   ? If you get your splint wet or if it is damaged please contact our clinic.  EXERCISES - Right arm ? Due to your splint being in place you will not be able to bear weight through your extremity.    ? You may use your sling for comfort ? Please continue to work on range of motion of your fingers and stretch these multiple times a day to prevent stiffness. ? Please continue to ambulate and do not stay sitting or lying for too long. Perform foot and wrist pumps to assist in circulation.  FOLLOW-UP ? If you develop a Fever (>101.5), Redness or Drainage from the surgical incision site, please call our office to arrange for an evaluation. ? Please call the office to schedule a follow-up appointment for your incision check if you do not already have one, 7-10 days post-operatively.  HELPFUL INFORMATION  . You may be more comfortable sleeping in a semi-seated position the first few nights following surgery.  Keep a pillow propped under the elbow and forearm for comfort.  If you have a recliner type of chair it might be beneficial.  If not that is fine too, but it would be  helpful to sleep propped up with pillows behind your operated shoulder as well under your elbow and forearm.  This will reduce pulling on the suture lines.  . When dressing, put your operative arm in the sleeve first.  When getting undressed, take your operative arm out last.  Loose fitting, button-down shirts are recommended.  Often in the first days after surgery you may be more comfortable keeping your operative arm under your shirt and not through the sleeve.  . You should wean off your narcotic medicines as soon as you are able.  Most patients will be off or using minimal narcotics before their first postop appointment.   . Do not drink alcoholic beverages or take illicit drugs when taking pain medications.  . It is against the law to drive while taking narcotics.  In some states it is against the law to drive while your arm is in a sling.   . Pain medication may make you constipated.  Below are a few solutions to try in this order:   - Decrease the amount of pain medication if you aren't having pain.   - Drink lots of decaffeinated fluids.   - Drink prune juice and/or each dried prunes  . If the first 3 don't work start with additional solutions   - Take Colace - an over-the-counter stool softener   - Take Senokot -  an over-the-counter laxative   - Take Miralax - a stronger over-the-counter laxative

## 2019-02-10 NOTE — Progress Notes (Signed)
   ORTHOPAEDIC PROGRESS NOTE  s/p Procedure(s): Right hip nail 02/04/19 ORIF Olecranon 02/09/19  SUBJECTIVE: Patient is pleasantly confused this morning. She does not complain of any pain. According to patient's nurse, she has been complaining of pain with any movement earlier this morning.   OBJECTIVE: PE: Right lower extremity: incision CDI - steri-strips in place, leg lengths equal, intact EHL/TA/GSC, she endorses distal sensation, warm well perfused foot Right upper extremity: Splint CDI. Skin intact though cannot assess fully beneath splint. Nontender to palpation proximally. Examination limited due to patient's cooperation. She is able to wiggle her fingers. She endorses sensation distally. Unable to fully test nerves due to patient's confusion. Well perfused digits.   Vitals:   02/10/19 0640 02/10/19 0748  BP:    Pulse: 94 93  Resp: 20 18  Temp:    SpO2: 96% 99%    ASSESSMENT: [name] is a 85 y.o. female doing reasonable postoperatively.  PLAN: Weightbearing:  - RUE: NWB in splint for 2 weeks. ROM to start gently after that. May consider removable posterior splint at that time. - RLE: WBAT Insicional and dressing care:  - RUE: keep splint clean and dry - RLE: leave steri strips, leave clean and open to air, dry gauze PRN Orthopedic device(s): none Showering: PRN with assistance VTE prophylaxis: Lovenox 40mg  qd x6 weeks, if ambulating well will transition to aspirin in clinic Pain control: PRN meds, minimize narcotics as able Follow - up plan: 2 weeks for incision check and XR in clinic Contact information: [name], PA-C. Dr. Ophelia Charter. Dispo: SNF   [name], [place] 02/10/2019

## 2019-02-11 ENCOUNTER — Inpatient Hospital Stay (HOSPITAL_COMMUNITY): Payer: MEDICARE

## 2019-02-11 ENCOUNTER — Encounter: Payer: Self-pay | Admitting: *Deleted

## 2019-02-11 DIAGNOSIS — U071 COVID-19: Secondary | ICD-10-CM

## 2019-02-11 DIAGNOSIS — R7989 Other specified abnormal findings of blood chemistry: Secondary | ICD-10-CM

## 2019-02-11 LAB — CBC
HCT: 28.5 % — ABNORMAL LOW (ref 36.0–46.0)
Hemoglobin: 8.9 g/dL — ABNORMAL LOW (ref 12.0–15.0)
MCH: 32 pg (ref 26.0–34.0)
MCHC: 31.2 g/dL (ref 30.0–36.0)
MCV: 102.5 fL — ABNORMAL HIGH (ref 80.0–100.0)
Platelets: 534 10*3/uL — ABNORMAL HIGH (ref 150–400)
RBC: 2.78 MIL/uL — ABNORMAL LOW (ref 3.87–5.11)
RDW: 14.6 % (ref 11.5–15.5)
WBC: 11.5 10*3/uL — ABNORMAL HIGH (ref 4.0–10.5)
nRBC: 0 % (ref 0.0–0.2)

## 2019-02-11 LAB — COMPREHENSIVE METABOLIC PANEL
ALT: 16 U/L (ref 0–44)
AST: 59 U/L — ABNORMAL HIGH (ref 15–41)
Albumin: 2.6 g/dL — ABNORMAL LOW (ref 3.5–5.0)
Alkaline Phosphatase: 71 U/L (ref 38–126)
Anion gap: 10 (ref 5–15)
BUN: 20 mg/dL (ref 8–23)
CO2: 29 mmol/L (ref 22–32)
Calcium: 8.8 mg/dL — ABNORMAL LOW (ref 8.9–10.3)
Chloride: 102 mmol/L (ref 98–111)
Creatinine, Ser: 0.66 mg/dL (ref 0.44–1.00)
GFR calc Af Amer: >60 mL/min (ref >60)
GFR calc non Af Amer: >60 mL/min (ref >60)
Glucose, Bld: 131 mg/dL — ABNORMAL HIGH (ref 70–99)
Potassium: 4 mmol/L (ref 3.5–5.1)
Sodium: 141 mmol/L (ref 135–145)
Total Bilirubin: 1.1 mg/dL (ref 0.3–1.2)
Total Protein: 5.5 g/dL — ABNORMAL LOW (ref 6.5–8.1)

## 2019-02-11 LAB — CREATININE, SERUM
Creatinine, Ser: 0.64 mg/dL (ref 0.44–1.00)
GFR calc Af Amer: >60 mL/min (ref >60)
GFR calc non Af Amer: >60 mL/min (ref >60)

## 2019-02-11 LAB — D-DIMER, QUANTITATIVE: D-Dimer, Quant: 5.54 ug/mL-FEU — ABNORMAL HIGH (ref 0.00–0.50)

## 2019-02-11 LAB — C-REACTIVE PROTEIN: CRP: 4.4 mg/dL — ABNORMAL HIGH (ref <1.0)

## 2019-02-11 MED ORDER — OXYCODONE HCL 5 MG PO TABS: ORAL_TABLET | ORAL | 0 refills | Status: DC

## 2019-02-11 MED ORDER — ENOXAPARIN SODIUM 40 MG/0.4ML ~~LOC~~ SOLN: 40.0000 mg | SUBCUTANEOUS | 0 refills | Status: DC

## 2019-02-11 MED ORDER — ENSURE ENLIVE PO LIQD
237.0000 mL | Freq: Three times a day (TID) | ORAL | Status: DC
  Administered 2019-02-11 – 2019-02-14 (×9): 237 mL via ORAL

## 2019-02-11 NOTE — TOC Progression Note (Signed)
Transition of Care [facility]) - Progression Note    Patient Details  Name: [name] MRN: [id number] Date of Birth: [birth year]  Transition of Care [facility]) CM/SW [place], LCSW Phone Number: 02/11/2019, 3:14 PM  Clinical Narrative:    CSW spoke with patient's daughter to provide update. [place] aware that patient is not ready for discharge.    Expected Discharge Plan: [place] Barriers to Discharge: Continued Medical Work up  Expected Discharge Plan and Services Expected Discharge Plan: [place] arrangements for the past 2 months: Single Family Home                                       Social Determinants of Health (SDOH) Interventions    Readmission Risk Interventions No flowsheet data found.

## 2019-02-11 NOTE — Progress Notes (Signed)
Nutrition Follow-up   RD working remotely.  DOCUMENTATION CODES:   Underweight  INTERVENTION:  Provide Ensure Enlive po TID, each supplement provides 350 kcal and 20 grams of protein  Provide Magic cup TID with meals, each supplement provides 290 kcal and 9 grams of protein  Encourage adequate PO intake.   NUTRITION DIAGNOSIS:   Increased nutrient needs related to post-op healing as evidenced by estimated needs; ongoing  GOAL:   Patient will meet greater than or equal to 90% of their needs; progressin  MONITOR:   PO intake, Supplement acceptance, Labs, Weight trends, Skin, I & O's  REASON FOR ASSESSMENT:   Consult Assessment of nutrition requirement/status  ASSESSMENT:   85 y/o female with h/o dementia, DM, sickle cell anemia, CHF, multiple myeloma and recent COVID infection admitted s/p fall and found to have femur and elbow fractures now s/p femur fracture repair 1/29.  Procedure 2/3: Right olecranon fracture open reduction internal fixation with tension banding and backup triceps fixation through bone tunnel   Meal completion has been 5-10%. Pt currently has Ensure ordered and has been consuming them. RD to increase Ensure to TID to aid in caloric and protein needs. Will continue to provide magic cup at meals. Per Palliative care, plans for SNF for rehabilitation, however if declines, likely transition to a hospice home.   Labs and medications reviewed.  Diet Order:   Diet Order            Diet regular Room service appropriate? No; Fluid consistency: Thin  Diet effective now              EDUCATION NEEDS:   Not appropriate for education at this time  Skin:  Skin Assessment: Skin Integrity Issues: Skin Integrity Issues:: Incisions Incisions: R hip, R elbow  Last BM:  2/3  Height:   Ht Readings from Last 1 Encounters:  02/03/19 5' 6"  (1.676 m)    Weight:   Wt Readings from Last 1 Encounters:  02/11/19 45.9 kg    Ideal Body Weight:  59  kg  BMI:  Body mass index is 16.33 kg/m.  Estimated Nutritional Needs:   Kcal:  1450-1600  Protein:  65-75 grams  Fluid:  >/= 1.5 L/day   Manus Rudd MS, RD, LDN RD pager number/after hours weekend pager on Amion.

## 2019-02-11 NOTE — Progress Notes (Addendum)
PROGRESS NOTE                                                                                                                                                                                                             Patient Demographics:    [name], is a 85 y.o. female, DOB - September 05, 1934, [id number]  Outpatient Primary MD for the patient is [name], MD   Admit date - 02/03/2019   LOS - 8  Chief Complaint  Patient presents with  . Leg Pain       Brief Narrative: Patient is a 85 y.o. 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  See below for further details.   Subjective:   Pleasantly confused-required in and out catheterization last night-as bladder scan showed more than 600 cc of urine.  No other events overnight-per RN still with very poor oral intake.   Assessment  & Plan :   COVID-19 infection-no respiratory symptoms-on room air this morning.  Appears to be relatively asymptomatic-chest x-ray on 1/29 did not show pneumonia.  CRP was elevated but now downtrending-could be from orthopedic issues rather than pneumonia.  Fever: afebrile  O2 requirements:  SpO2: 97 % O2 Flow Rate (L/min): 2 L/min   COVID-19 Labs: Recent Labs    02/10/19 0231 02/11/19 0500 02/11/19 1039  DDIMER 4.37* 5.54*  --   FERRITIN 295  --   --   CRP 8.0*  --  4.4*    No results found for: BNP  No results for input(s): PROCALCITON in the last 168 hours.  Lab Results  Component Value Date   SARSCOV2NAA POSITIVE (A) 02/03/2019     COVID-19 Medications: None  Prone/Incentive Spirometry: encouraged  incentive spirometry use 3-4/hour.  DVT Prophylaxis  :  Lovenox   Elevated D-dimer: Await lower extremity  Doppler-remains on Lovenox for prophylaxis.  Not hypoxic-hence do not think patient requires a CT angio of the chest.  Right proximal femur fracture: S/p right cephalomedullary nail on 1/29.  Orthopedics following-recommendations are for WBAT RLE-Lovenox 40 mg daily x 6 weeks.  Comminuted fracture of the right olecranon: S/p ORIF on 2/3-orthopedics following.  NWB to RUE  while in splint for 2 weeks, ROM to start gently after that.  Acute blood loss anemia from perioperative blood loss: Hemoglobin stable-s/p 1 unit of PRBC so far.  Follow.  Dementia: At risk for delirium-currently pleasantly confused-maintain delirium precautions.  Acute urinary retention: Foley catheter discontinued on 2/4-apparently was placed due to retention-voiding trial unsuccessful so far-we will continue to follow closely and if patient again has significant urinary retention-we will need a Foley catheter to be reinserted.  Continue Flomax.  Failure to thrive syndrome: Poor oral intake continues-per RN-hardly any oral intake over the past few days continue supportive care-maximize supplements as much as possible. Volume status is stable on exam-lytes stable-may need IVF if no oral intake continues.See goals of care discussion below  Nutrition Problem: Nutrition Problem: Increased nutrient needs Etiology: post-op healing Signs/Symptoms: estimated needs Interventions: MVI, Ensure Enlive (each supplement provides 350kcal and 20 grams of protein), Magic cup    Goals of care: DNR in place-Long discussion with patient's daughter-family is aware that patient now has severe failure to thrive syndrome-very poor oral intake-plan is to watch for a few days to see if she will start eating-if not we may have to consider hospice at some point.  Consults  :  Orthopedics  Procedures  :  1/29>> right cephalomedullary nail-right hip 2/3>> ORIF-right olecranon  ABG: No results found for: PHART, PCO2ART, PO2ART, HCO3, TCO2,  ACIDBASEDEF, O2SAT  Condition -  Guarded  Family Communication  :  Daughter updated over the phone 2/5  Code Status :DNR  Diet :  Diet Order            Diet regular Room service appropriate? No; Fluid consistency: Thin  Diet effective now               Disposition Plan  :  SNF on discharge-if no improvement in severe failure to thrive syndrome then may need residential hospice  Barriers to discharge: Severe failure to thrive syndrome-no oral intake for the past several days-acute urinary retention issues-social work following for SNF-May need hospice depending on clinical trajectory over the next few days.  Antimicorbials  :    Anti-infectives (From admission, onward)   Start     Dose/Rate Route Frequency Ordered Stop   02/09/19 1830  ceFAZolin (ANCEF) IVPB 1 g/50 mL premix     1 g 100 mL/hr over 30 Minutes Intravenous Every 6 hours 02/09/19 1829 02/10/19 0910   02/09/19 1535  vancomycin (VANCOCIN) powder  Status:  Discontinued       As needed 02/09/19 1617 02/09/19 1620   02/09/19 0600  ceFAZolin (ANCEF) IVPB 2g/100 mL premix     2 g 200 mL/hr over 30 Minutes Intravenous On call to O.R. 02/04/19 1802 02/09/19 0623   02/07/19 1000  ceFAZolin (ANCEF) IVPB 2g/100 mL premix     2 g 200 mL/hr over 30 Minutes Intravenous To Short Stay 02/07/19 0800 02/08/19 1000   02/04/19 2200  ceFAZolin (ANCEF) IVPB 2g/100 mL premix     2 g 200 mL/hr over 30 Minutes Intravenous Every 8 hours 02/04/19 1802 02/05/19 0727      Inpatient Medications  Scheduled Meds: . acetaminophen  650 mg Oral TID  . vitamin C  500 mg Oral BID  . Chlorhexidine Gluconate Cloth  6 each Topical Daily  . enoxaparin (LOVENOX) injection  40 mg Subcutaneous Q24H  . feeding supplement (ENSURE ENLIVE)  237 mL Oral TID BM  . ferrous sulfate  325 mg Oral BID WC  .  mouth rinse  15 mL Mouth Rinse BID  . multivitamin with minerals  1 tablet Oral Daily  . senna-docusate  2 tablet Oral BID  . tamsulosin  0.4 mg Oral  Daily  . cholecalciferol  1,000 Units Oral Daily  . zinc sulfate  220 mg Oral Daily   Continuous Infusions: . sodium chloride 1,000 mL (02/10/19 2254)   PRN Meds:.sodium chloride, bisacodyl, HYDROmorphone (DILAUDID) injection, lip balm, magnesium citrate, menthol-cetylpyridinium **OR** phenol, ondansetron **OR** ondansetron (ZOFRAN) IV, oxyCODONE, polyethylene glycol   Time Spent in minutes  25  See all Orders from today for further details   Oren Binet M.D on 02/11/2019 at 2:49 PM  To page go to [email or web address] - use universal password  [facility] -  Office  [phone]    Objective:   Vitals:   02/11/19 0517 02/11/19 0742 02/11/19 1200 02/11/19 1443  BP:  98/62 (!) 98/47   Pulse: 89 81 85   Resp: 18 17 20    Temp:  97.8 F (36.6 C)  98.9 F (37.2 C)  TempSrc:  Oral  Axillary  SpO2: 97% 97%    Weight: 45.9 kg     Height:        Wt Readings from Last 3 Encounters:  02/11/19 45.9 kg  02/03/19 41.7 kg  09/06/18 40.4 kg     Intake/Output Summary (Last 24 hours) at 02/11/2019 1449 Last data filed at 02/11/2019 1320 Gross per 24 hour  Intake 658.17 ml  Output 628 ml  Net 30.17 ml     Physical Exam Gen Exam: Pleasantly confused-not in distress. HEENT:atraumatic, normocephalic Chest: B/L clear to auscultation anteriorly CVS:S1S2 regular Abdomen:soft non tender, non distended Extremities:no edema Neurology: Moves all 4 extremities.   Skin: no rash   Data Review:    CBC Recent Labs  Lab 02/06/19 0140 02/07/19 0222 02/08/19 1147 02/10/19 0231 02/11/19 1039  WBC 8.1 8.5 8.3 12.6* 11.5*  HGB 6.7* 8.7* 9.4* 9.2* 8.9*  HCT 20.1* 25.6* 28.7* 28.8* 28.5*  PLT 295 307 437* 457* 534*  MCV 96.2 94.8 97.0 101.8* 102.5*  MCH 32.1 32.2 31.8 32.5 32.0  MCHC 33.3 34.0 32.8 31.9 31.2  RDW 12.3 13.2 13.5 14.0 14.6  LYMPHSABS  --   --  1.3  --   --   MONOABS  --   --  1.0  --   --   EOSABS  --   --  0.1  --   --   BASOSABS  --   --  0.0  --   --      Chemistries  Recent Labs  Lab 02/06/19 0140 02/06/19 0140 02/06/19 0614 02/07/19 0222 02/08/19 1147 02/10/19 0231 02/11/19 0500 02/11/19 1039  NA 137  --   --  138 141 138  --  141  K 3.4*  --   --  3.7 3.5 4.3  --  4.0  CL 102  --   --  104 103 103  --  102  CO2 25  --   --  26 26 25   --  29  GLUCOSE 91  --   --  116* 127* 125*  --  131*  BUN 12  --   --  19 16 22   --  20  CREATININE 0.70   < >  --  0.50 0.63 0.73 0.64 0.66  CALCIUM 8.1*  --   --  8.2* 8.9 8.5*  --  8.8*  MG  --   --  2.1  --   --   --   --   --  AST  --   --   --   --   --  51*  --  59*  ALT  --   --   --   --   --  22  --  16  ALKPHOS  --   --   --   --   --  59  --  71  BILITOT  --   --   --   --   --  1.1  --  1.1   < > = values in this interval not displayed.   ------------------------------------------------------------------------------------------------------------------ No results for input(s): CHOL, HDL, LDLCALC, TRIG, CHOLHDL, LDLDIRECT in the last 72 hours.  No results found for: HGBA1C ------------------------------------------------------------------------------------------------------------------ No results for input(s): TSH, T4TOTAL, T3FREE, THYROIDAB in the last 72 hours.  Invalid input(s): FREET3 ------------------------------------------------------------------------------------------------------------------ Recent Labs    02/10/19 0231  FERRITIN 295    Coagulation profile No results for input(s): INR, PROTIME in the last 168 hours.  Recent Labs    02/10/19 0231 02/11/19 0500  DDIMER 4.37* 5.54*    Cardiac Enzymes No results for input(s): CKMB, TROPONINI, MYOGLOBIN in the last 168 hours.  Invalid input(s): CK ------------------------------------------------------------------------------------------------------------------ No results found for: BNP  Micro Results Recent Results (from the past 240 hour(s))  Respiratory Panel by RT PCR (Flu A&B, Covid) -  Nasopharyngeal Swab     Status: Abnormal   Collection Time: 02/03/19  7:19 PM   Specimen: Nasopharyngeal Swab  Result Value Ref Range Status   SARS Coronavirus 2 by RT PCR POSITIVE (A) NEGATIVE Final    Comment: RESULT CALLED TO, READ BACK BY AND VERIFIED WITH: T WALKER,RN @2058  02/03/19 MKELLY (NOTE) SARS-CoV-2 target nucleic acids are DETECTED. SARS-CoV-2 RNA is generally detectable in upper respiratory specimens  during the acute phase of infection. Positive results are indicative of the presence of the identified virus, but do not rule out bacterial infection or co-infection with other pathogens not detected by the test. Clinical correlation with patient history and other diagnostic information is necessary to determine patient infection status. The expected result is Negative. Fact Sheet for Patients:  PinkCheek.be Fact Sheet for Healthcare Providers: GravelBags.it This test is not yet approved or cleared by the [place] FDA and  has been authorized for detection and/or diagnosis of SARS-CoV-2 by FDA under an Emergency Use Authorization (EUA).  This EUA will remain in effect (meaning this test can be used) for  the duration of  the COVID-19 declaration under Section 564(b)(1) of the Act, 21 U.S.C. section 360bbb-3(b)(1), unless the authorization is terminated or revoked sooner.    Influenza A by PCR NEGATIVE NEGATIVE Final   Influenza B by PCR NEGATIVE NEGATIVE Final    Comment: (NOTE) The Xpert Xpress SARS-CoV-2/FLU/RSV assay is intended as an aid in  the diagnosis of influenza from Nasopharyngeal swab specimens and  should not be used as a sole basis for treatment. Nasal washings and  aspirates are unacceptable for Xpert Xpress SARS-CoV-2/FLU/RSV  testing. Fact Sheet for Patients: PinkCheek.be Fact Sheet for Healthcare Providers: GravelBags.it This test  is not yet approved or cleared by the [place] FDA and  has been authorized for detection and/or diagnosis of SARS-CoV-2 by  FDA under an Emergency Use Authorization (EUA). This EUA will remain  in effect (meaning this test can be used) for the duration of the  Covid-19 declaration under Section 564(b)(1) of the Act, 21  U.S.C. section 360bbb-3(b)(1), unless the authorization is  terminated or revoked. Performed at [facility], [address], [place], [place] [address]   Surgical PCR screen     Status: None   Collection Time: 02/04/19  1:12 PM   Specimen: Nasal Mucosa; Nasal Swab  Result Value Ref Range Status   MRSA, PCR NEGATIVE NEGATIVE Final   Staphylococcus aureus NEGATIVE NEGATIVE Final    Comment: (NOTE) The Xpert SA Assay (FDA approved for NASAL specimens in patients 34 years of age and older), is one component of a comprehensive surveillance program. It is not intended to diagnose infection nor to guide or monitor treatment. Performed at [facility], [place] [address] [place], [place] [address]     Radiology Reports DG Chest 2 View  Result Date: 02/03/2019 CLINICAL DATA:  Fall, confusion at baseline EXAM: CHEST - 2 VIEW COMPARISON:  In a 01/23/2012, 01/27/2008 FINDINGS: Normal heart size, mediastinal contours, and pulmonary vascularity. Atherosclerotic calcification aorta. Emphysematous and bronchitic changes consistent with COPD. Interstitial prominence, chronic. No infiltrate, pleural effusion or pneumothorax. Bones demineralized with mark compression deformity of a lower thoracic vertebra, age indeterminate but new since 2010. IMPRESSION: COPD changes without acute infiltrate. Age-indeterminate compression fracture of a lower thoracic vertebra. Electronically Signed   By: Lavonia Dana M.D.   On: 02/03/2019 10:59   DG Pelvis 1-2 Views  Result Date: 02/03/2019 CLINICAL DATA:  Status post fall. EXAM: PELVIS - 1-2 VIEW COMPARISON:  None. FINDINGS: An acute fracture  deformity is seen involving the proximal right femoral shaft. This is just below the intertrochanteric region. Lateral angulation of the fracture site is seen. Mild to moderate severity degenerative changes seen involving the bilateral hips. IMPRESSION: Acute fracture of the proximal right femur. Electronically Signed   By: Virgina Norfolk M.D.   On: 02/03/2019 19:11   DG Pelvis 1-2 Views  Result Date: 02/03/2019 CLINICAL DATA:  Fall, baseline confusion, pain in pelvis when moving for imaging EXAM: PELVIS - 1-2 VIEW COMPARISON:  None FINDINGS: Osseous demineralization. RIGHT SI joint poorly profiled. At LEFT SI joint and hip joint spaces preserved. No definite fracture, dislocation, or bone destruction. IMPRESSION: No definite acute osseous abnormalities. Electronically Signed   By: Lavonia Dana M.D.   On: 02/03/2019 11:00   DG Elbow 2 Views Right  Result Date: 02/09/2019 CLINICAL DATA:  Postoperative evaluation. EXAM: RIGHT ELBOW - 2 VIEW COMPARISON:  February 03, 2019 FINDINGS: It should be noted that the right elbow was imaged in a plaster cast with subsequently obscured osseous and soft tissue detail. A large radiopaque fixation screw is seen within the proximal right ulna. An acute fracture is not identified. There is no evidence of arthropathy or other focal bone abnormality. Soft tissues are unremarkable. IMPRESSION: Prior open reduction internal fixation of the proximal right ulna. Electronically Signed   By: Virgina Norfolk M.D.   On: 02/09/2019 18:58   DG Elbow Complete Right  Result Date: 02/03/2019 CLINICAL DATA:  Unwitnessed fall, swelling and bruising RIGHT elbow EXAM: RIGHT ELBOW - COMPLETE 3+ VIEW COMPARISON:  None FINDINGS: Osseous demineralization. Soft tissue swelling overlying the elbow and proximal forearm. Intra-articular olecranon fracture, proximal fragment distracted 2.3 cm. Humerus and radius intact. IMPRESSION: Distracted intra-articular olecranon fracture with significant  overlying soft tissue swelling. Electronically Signed   By: Lavonia Dana M.D.   On: 02/03/2019 10:57   CT Head Wo Contrast  Result Date: 02/03/2019 CLINICAL DATA:  Trauma to the head and neck. EXAM: CT HEAD WITHOUT CONTRAST CT CERVICAL SPINE WITHOUT CONTRAST TECHNIQUE: Multidetector CT imaging of the head and  cervical spine was performed following the standard protocol without intravenous contrast. Multiplanar CT image reconstructions of the cervical spine were also generated. COMPARISON:  01/27/2012 FINDINGS: CT HEAD FINDINGS Brain: The brain does not show accelerated generalized atrophy. No focal finding affects the brainstem or cerebellum. 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 Mild chronic small-vessel change of the hemispheric white matter. No hydrocephalus, hemorrhage or extra-axial collection. Vascular: There is atherosclerotic calcification of the major vessels at the base of the brain. Skull: Left pterional craniotomy for previous aneurysm clipping. Otherwise negative. Sinuses/Orbits: Clear/normal Other: None CT CERVICAL SPINE FINDINGS Alignment: Normal except for 2 mm of degenerative anterolisthesis at C4-5. Skull base and vertebrae: No skull base fracture. No cervical spine fracture. Compression fracture at the superior endplate of T1 with loss of height of 10% or less. This is age indeterminate but could be recent. Soft tissues and spinal canal: Negative Disc levels: Chronic spondylosis at C5-6. Mild bilateral bony foraminal narrowing. Chronic facet osteoarthritis on the left at C4-5. Left bony foraminal encroachment because of this. Upper chest: Negative Other: None IMPRESSION: Head CT: No acute or traumatic finding. Previous left pterional craniotomy for aneurysm clipping. Cervical spine CT: No cervical spine fracture. Minimal superior endplate compression fracture at T1 with loss of height of 10%. This is age  indeterminate but could possibly be recent. Degenerative spondylosis at C5-6. Degenerative facet arthritis on the left at C4-5. Electronically Signed   By: Nelson Chimes M.D.   On: 02/03/2019 11:13   CT Cervical Spine Wo Contrast  Result Date: 02/03/2019 CLINICAL DATA:  Trauma to the head and neck. EXAM: CT HEAD WITHOUT CONTRAST CT CERVICAL SPINE WITHOUT CONTRAST TECHNIQUE: Multidetector CT imaging of the head and cervical spine was performed following the standard protocol without intravenous contrast. Multiplanar CT image reconstructions of the cervical spine were also generated. COMPARISON:  01/27/2012 FINDINGS: CT HEAD FINDINGS Brain: The brain does not show accelerated generalized atrophy. No focal finding affects the brainstem or cerebellum. 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 Mild chronic small-vessel change of the hemispheric white matter. No hydrocephalus, hemorrhage or extra-axial collection. Vascular: There is atherosclerotic calcification of the major vessels at the base of the brain. Skull: Left pterional craniotomy for previous aneurysm clipping. Otherwise negative. Sinuses/Orbits: Clear/normal Other: None CT CERVICAL SPINE FINDINGS Alignment: Normal except for 2 mm of degenerative anterolisthesis at C4-5. Skull base and vertebrae: No skull base fracture. No cervical spine fracture. Compression fracture at the superior endplate of T1 with loss of height of 10% or less. This is age indeterminate but could be recent. Soft tissues and spinal canal: Negative Disc levels: Chronic spondylosis at C5-6. Mild bilateral bony foraminal narrowing. Chronic facet osteoarthritis on the left at C4-5. Left bony foraminal encroachment because of this. Upper chest: Negative Other: None IMPRESSION: Head CT: No acute or traumatic finding. Previous left pterional craniotomy for aneurysm clipping. Cervical spine CT: No cervical spine  fracture. Minimal superior endplate compression fracture at T1 with loss of height of 10%. This is age indeterminate but could possibly be recent. Degenerative spondylosis at C5-6. Degenerative facet arthritis on the left at C4-5. Electronically Signed   By: Nelson Chimes M.D.   On: 02/03/2019 11:13   CT Hip Right Wo Contrast  Result Date: 02/03/2019 CLINICAL DATA:  Hip fracture EXAM: CT OF THE RIGHT HIP  WITHOUT CONTRAST TECHNIQUE: Multidetector CT imaging of the right hip was performed according to the standard protocol. Multiplanar CT image reconstructions were also generated. COMPARISON:  Femur fracture 02/03/2019 FINDINGS: Bones/Joint/Cartilage The osseous structures appear diffusely demineralized which may limit detection of small or nondisplaced fractures. Comminuted extracapsular right femur fracture with predominantly intertrochanteric extension but with complete separation of the greater and lesser trochanters from both the femoral neck and the subtrochanteric region (AO/OTA A3.3). This is an unstable femur fracture. Hyperdense hemorrhage is noted within the bony trabecula a about the fracture site. The femoral head remains normally located. There are chronic degenerative changes of the acetabuli. Periacetabular spurring and os acetabuli likely reflects sequela of prior glenoid degenerative change. Remaining imaged portions of the pelvic bones are intact. Ligaments Suboptimally assessed by CT. Muscles and Tendons There is soft tissue swelling and edematous changes throughout the musculature of the proximal thigh. No large joint effusion is seen likely as this fracture is extracapsular in nature. Soft tissues Extensive right hip soft tissue swelling and edematous changes. Hemorrhage about the fracture line within the bony trabecula, as detailed above. Included portions of the pelvis demonstrates some atherosclerosis and un inflamed colonic diverticula but are otherwise unremarkable. IMPRESSION: Unstable  comminuted extracapsular right femur fracture with predominantly intertrochanteric extension but with complete separation of the greater and lesser trochanters from both the femoral neck and the subtrochanteric region (AO/OTA A3.3). Hyperdense hemorrhage is noted within the bony trabecula about the fracture site. No large joint effusion likely as this fracture is extracapsular in nature. Associated right hip soft tissue swelling and edema. More chronic degenerative changes the right Electronically Signed   By: Lovena Le M.D.   On: 02/03/2019 22:47   CT ELBOW RIGHT WO CONTRAST  Result Date: 02/07/2019 CLINICAL DATA:  Right elbow pain status post fall, preop 02/09/2019. EXAM: CT OF THE UPPER RIGHT EXTREMITY WITHOUT CONTRAST TECHNIQUE: Multidetector CT imaging of the upper right extremity was performed according to the standard protocol. COMPARISON:  None. FINDINGS: Bones/Joint/Cartilage Limited evaluation secondary to positioning. Comminuted fracture of the olecranon with 2 cm of distraction. Mild cortical irregularity along the posterior aspect of the capitellum concerning for small marginal osteophyte versus a subtle nondisplaced fracture (image 31/series 4). Large joint effusion. No other acute fracture or dislocation. No aggressive osseous lesion. Ligaments Suboptimally assessed by CT. Muscles and Tendons Muscles are normal. No muscle atrophy. Biceps tendon is intact. Triceps tendon is intact. Soft tissues No fluid collection or hematoma. IMPRESSION: Comminuted fracture of the olecranon with 2 cm of distraction. Mild cortical irregularity along the posterior aspect of the capitellum concerning for small marginal osteophyte versus a subtle nondisplaced fracture (image 31/series 4). Electronically Signed   By: Kathreen Devoid   On: 02/07/2019 16:39   DG CHEST PORT 1 VIEW  Result Date: 02/04/2019 CLINICAL DATA:  Oxygen desaturation. EXAM: PORTABLE CHEST 1 VIEW COMPARISON:  February 03, 2019 FINDINGS: The heart,  hila, and mediastinum are normal. No pneumothorax. No nodules or masses. No focal infiltrates. IMPRESSION: No active disease. Electronically Signed   By: Dorise Bullion III M.D   On: 02/04/2019 19:52   DG C-Arm 1-60 Min  Result Date: 02/04/2019 CLINICAL DATA:  Femur fracture fixation EXAM: DG C-ARM 1-60 MIN; RIGHT FEMUR 2 VIEWS FLUOROSCOPY TIME:  Fluoroscopy Time:  1 minutes 50 seconds Number of Acquired Spot Images: 4 COMPARISON:  None. FINDINGS: Intraoperative radiographs demonstrate trochanteric femoral nail and intramedullary rod fixation proximal right femur shaft fracture. There are 2 distal interlocking  screws. Alignment appears improved. IMPRESSION: Fluoroscopic guidance for fixation of right femur fracture with improved alignment Electronically Signed   By: Macy Mis M.D.   On: 02/04/2019 16:51   DG HIP PORT UNILAT WITH PELVIS 1V RIGHT  Result Date: 02/11/2019 CLINICAL DATA:  Postoperative evaluation. EXAM: DG HIP (WITH OR WITHOUT PELVIS) 1V PORT RIGHT COMPARISON:  02/04/2019. FINDINGS: Patient status post ORIF right femur. Hardware intact and in stable position. Stable alignment. Diffuse osteopenia. Degenerative changes both hips. Pelvic calcifications consistent phleboliths. IMPRESSION: ORIF right femur.  Hardware intact.  Stable alignment. Electronically Signed   By: Marcello Moores  Register   On: 02/11/2019 08:44   DG FEMUR, MIN 2 VIEWS RIGHT  Result Date: 02/04/2019 CLINICAL DATA:  Femur fracture fixation EXAM: DG C-ARM 1-60 MIN; RIGHT FEMUR 2 VIEWS FLUOROSCOPY TIME:  Fluoroscopy Time:  1 minutes 50 seconds Number of Acquired Spot Images: 4 COMPARISON:  None. FINDINGS: Intraoperative radiographs demonstrate trochanteric femoral nail and intramedullary rod fixation proximal right femur shaft fracture. There are 2 distal interlocking screws. Alignment appears improved. IMPRESSION: Fluoroscopic guidance for fixation of right femur fracture with improved alignment Electronically Signed   By:  Macy Mis M.D.   On: 02/04/2019 16:51   DG FEMUR PORT, MIN 2 VIEWS RIGHT  Result Date: 02/04/2019 CLINICAL DATA:  Postoperative evaluation. EXAM: RIGHT FEMUR PORTABLE 2 VIEW COMPARISON:  None. FINDINGS: A radiopaque intramedullary rod is seen along the length of the right femur. A radiopaque compression screw device is noted within the right femoral head and neck. An inter trochanteric fracture of the proximal right femur is seen with gross anatomic alignment. Soft tissues are unremarkable. IMPRESSION: Status post open reduction and internal fixation of the proximal right femur. Electronically Signed   By: Virgina Norfolk M.D.   On: 02/04/2019 18:07   DG Femur Portable Min 2 Views Right  Result Date: 02/03/2019 CLINICAL DATA:  Status post fall. EXAM: RIGHT FEMUR PORTABLE 2 VIEW COMPARISON:  None. FINDINGS: Acute fracture deformity is seen involving shaft of the proximal right femur. This extends to the level just below the intertrochanteric region. Superolateral angulation of the fracture site is seen. There is no evidence of dislocation. IMPRESSION: Acute fracture of the proximal right femur. Electronically Signed   By: Virgina Norfolk M.D.   On: 02/03/2019 19:12

## 2019-02-11 NOTE — Progress Notes (Signed)
Occupational Therapy Treatment Patient Details Name: [name] MRN: [id number] DOB: [birth year] Today's Date: 02/11/2019    History of present illness Pt adm after fall with displaced segmental fracture of the rt femur. Pt found to be Covid+. Pt had fall earlier in the day suffering a displaced olecranon fx on the rt and was splinted and dc'd home from the ED before suffering second fall. Pt underwent ORIF of rt femur on 1/29. Underwent R olecranon fx ORIF on 02/09/19. PMH - dementia, craniotomy for aneurysm clipping.   OT comments  Upon arrival, pt supine in bed and watching TV. Pt able to perform AROM at fingers. Pt requiring Max-Total A for bed mobility supine<>EOB. Pt sitting at EOB for 5-8 minutes with Min guard A. Pt very confused and presenting with difficulty following commands, performing ADLs, and engaging in conversation; repeating that wants to "get out of this place". Once returned to supine, pt calming down and watching TV. VSS on RA. Continue to recommend dc to SNF and will continue to follow acutely.    Follow Up Recommendations  SNF;Supervision/Assistance - 24 hour    Equipment Recommendations  Other (comment)(defer to next venue)    Recommendations for Other Services      Precautions / Restrictions Precautions Precautions: Fall;Other (comment) Precaution Comments: NWB RUE, NO ROM, splint. WBAT RLE Required Braces or Orthoses: Splint/Cast Splint/Cast: RUE Restrictions Weight Bearing Restrictions: Yes RUE Weight Bearing: Non weight bearing RLE Weight Bearing: Weight bearing as tolerated Other Position/Activity Restrictions: NO ROM to RUE       Mobility Bed Mobility Overal bed mobility: Needs Assistance Bed Mobility: Supine to Sit;Sit to Supine     Supine to sit: Max assist Sit to supine: Total assist   General bed mobility comments: Max A to bring BLEs towards EOB and then elevate trunk. Total A for returning to supine  Transfers                  General transfer comment: Defered for safety    Balance Overall balance assessment: Needs assistance;History of Falls Sitting-balance support: Feet supported;Single extremity supported Sitting balance-Leahy Scale: Fair                                     ADL either performed or assessed with clinical judgement   ADL Overall ADL's : Needs assistance/impaired                       Lower Body Dressing Details (indicate cue type and reason): Pt bending forward while seated at EOB and adjusting her left sock;               General ADL Comments: Pt sitting at EOB with Min Guard A for ~ 5-8 minutes.     Vision       Perception     Praxis      Cognition Arousal/Alertness: Awake/alert Behavior During Therapy: WFL for tasks assessed/performed Overall Cognitive Status: History of cognitive impairments - at baseline                                 General Comments: Dementia. Very confused. Agitated with movement. Repeating that she needed to leave this place. Once transfered back into bed and turned TV on she was content again.        Exercises  Exercises: General Lower Extremity   Shoulder Instructions       General Comments VSS    Pertinent Vitals/ Pain       Pain Assessment: Faces Faces Pain Scale: Hurts whole lot Pain Location: R hip with certain movements Pain Descriptors / Indicators: Discomfort;Grimacing;Sore Pain Intervention(s): Monitored during session;Limited activity within patient's tolerance;Repositioned  Home Living                                          Prior Functioning/Environment              Frequency  Min 2X/week        Progress Toward Goals  OT Goals(current goals can now be found in the care plan section)  Progress towards OT goals: Not progressing toward goals - comment(Limited by confusion today)  Acute Rehab OT Goals Patient Stated Goal: Pt didn't state, agreeable  to moving with therapies OT Goal Formulation: With patient Time For Goal Achievement: 02/19/19 Potential to Achieve Goals: Good ADL Goals Pt Will Perform Grooming: with set-up;with supervision;sitting Pt Will Perform Upper Body Bathing: with min assist;sitting Pt Will Perform Upper Body Dressing: with min assist;sitting Pt Will Transfer to Toilet: with mod assist;stand pivot transfer;bedside commode Pt Will Perform Toileting - Clothing Manipulation and hygiene: with mod assist;sit to/from stand;sitting/lateral leans Pt/caregiver will Perform Home Exercise Program: Increased ROM;Increased strength;Right Upper extremity;With written HEP provided(within available/permissible limits) Additional ADL Goal #1: Pt will perform bed mobility with modA+1 as precursor to EOB/OOB ADL.  Plan Discharge plan remains appropriate    Co-evaluation                 AM-PAC OT "6 Clicks" Daily Activity     Outcome Measure   Help from another person eating meals?: A Little Help from another person taking care of personal grooming?: A Lot Help from another person toileting, which includes using toliet, bedpan, or urinal?: Total Help from another person bathing (including washing, rinsing, drying)?: A Lot Help from another person to put on and taking off regular upper body clothing?: A Lot Help from another person to put on and taking off regular lower body clothing?: Total 6 Click Score: 11    End of Session    OT Visit Diagnosis: Pain;Other symptoms and signs involving cognitive function;Other abnormalities of gait and mobility (R26.89) Pain - Right/Left: Right Pain - part of body: Hip;Leg   Activity Tolerance Patient tolerated treatment well;Patient limited by pain   Patient Left with call bell/phone within reach;in bed;with bed alarm set   Nurse Communication Mobility status        Time: [id number] OT Time Calculation (min): 27 min  Charges: OT General Charges $OT Visit: 1 Visit OT  Treatments $Self Care/Home Management : 23-37 mins  [place], OTR/L Acute Rehab Pager: [phone] Office: [place] 02/11/2019, 5:56 PM

## 2019-02-11 NOTE — Progress Notes (Signed)
Bilateral lower extremity venous duplex exam completed.  Preliminary results can be found under CV proc under chart review.  02/11/2019 4:11 PM  Leba Tibbitts, K., RDMS, RVT

## 2019-02-11 NOTE — Progress Notes (Signed)
Palliative Medicine Inpatient Follow Up Note   HPI: Per recent hospitalist note --> 85 year old lady prior history of dementia, cerebral artery aneurysm presented to ER with of right leg pain following a mechanical fall. On arrival to ED she underwent x-rays showing a right intra-articular fracture and was given a posterior splint and discharged home. She had another fall after returning home and sustained injury to the right leg. X-rays of the right leg show acute fracture of the proximal right femur with lateral angulation just below the intertrochanteric region.Patient also tested positive for Covidon admission. Hospital course complicated by post op hypoxia and anemia of blood loss.  Today's Discussion (02/11/2019): Met with [name] at bedside. She was in good spirits and stated that she was waiting for her food to be heated up. We talked about how she was feeling. She had no recollection of surgery to her hip or arm. She overall endorsed feeling well.   Talked to [name], her daughter via telephone. We talked about [name]'s lack of appetite. Discussed the various reasons why this could be. Talked about her going to SNF for rehabilitation but if she were to worsen that she would likely wish for transition to a hospice home at that time.   Discussed the importance of continued conversation with family and their  medical providers regarding overall plan of care and treatment options, ensuring decisions are within the context of the patients values and GOCs.  Questions and concerns addressed   Vital Signs Vitals:   02/11/19 0517 02/11/19 0742  BP:  98/62  Pulse: 89 81  Resp: 18 17  Temp:  97.8 F (36.6 C)  SpO2: 97% 97%    Intake/Output Summary (Last 24 hours) at 02/11/2019 1121 Last data filed at 02/11/2019 0444 Gross per 24 hour  Intake 598.17 ml  Output 628 ml  Net -29.83 ml   Last Weight  Most recent update: 02/11/2019  5:19 AM   Weight  45.9 kg (101 lb 3.1 oz)            Gen:  Elderly F in NAD HEENT: dry mucous membranes CV: Irregular rate and regular rhythm, no murmurs rubs or gallops PULM: clear to auscultation bilaterally  ABD: soft/nontender/nondistended/normal bowel sounds  EXT: RLE edema Neuro: Alert and oriented x1  Recommendations and Plan: Goals of Care: - MOST completed   Right Hip Pain: Right elbow pain:                 - Tylenol '650mg'$  PO TID                 - Oxycodone 2.5-69m PO Q4H PRN                 - Dilaudid 0.'5mg'$  IVP Q4H PRN for breakthrough pain  (short term use)  Constipation: - Miralax 17g PO QDay PRN                 - Senna 2 tabs PO BID  Muscular Weakness: - PT Eval - OT Eval  Urinary Retention:                 - Agree with QShift bladder scans                 - Could try prompted voiding                 - If needed straight catheterize  Delirium: - Delirium precautions - Get up during the day -  Encourage a familiar face to remain present throughout the day - Keep blinds open and lights on during daylight hours - Minimize the use of benzodiazepines                 - Utilize opioids for short term unless already tolerant   Spiritual: - Chaplain consult Dispo:  - Patients daughter hopeful for improvement but realistic about current state and lack of PO intake.   - Plan for SNF placement  - TOC --> OP Palliative FU  - If patient does not improve at SNF plan for transition to hospice home   Time Spent: 35 Greater than 50% of the time was spent in counseling and coordination of care ______________________________________________________________________________________ [place] Team Team Cell Phone: [phone] Please utilize secure chat with additional questions, if there is no response  within 30 minutes please call the above phone number  Palliative Medicine Team providers are available by phone from 7am to 7pm daily and can be reached through the team cell phone.  Should this patient require assistance outside of these hours, please call the patient's attending physician.

## 2019-02-11 NOTE — Progress Notes (Addendum)
   ORTHOPAEDIC PROGRESS NOTE  s/p Procedure(s): Right hip nail 02/04/19 ORIF Olecranon 02/09/19  SUBJECTIVE: Patient is very confused this morning, but appears comfortable in her hospital bed.   OBJECTIVE: PE: Right lower extremity: incision CDI - steri-strips in place, leg lengths equal, intact EHL/TA/GSC, she endorses distal sensation distally, warm well perfused foot Right upper extremity: Splint CDI. Skin intact though cannot assess fully beneath splint. Nontender to palpation proximally. Examination limited due to patient's cooperation. She is able to wiggle her fingers. Unable to fully test nerves due to patient's confusion. Well perfused digits.   Vitals:   02/11/19 0420 02/11/19 0517  BP: 106/63   Pulse: 85 89  Resp: 18 18  Temp: 98.4 F (36.9 C)   SpO2: 94% 97%    ASSESSMENT: [name] is a 85 y.o. female post-op day #7 right hip nail, post-op day #2 ORIF right olecranon  PLAN: Weightbearing:  - RUE: NWB in splint for 2 weeks. ROM to start gently after that. May consider removable posterior splint at that time. - RLE: WBAT Insicional and dressing care:  - RUE: keep splint clean and dry - RLE: leave steri strips, leave clean and open to air, dry gauze PRN Orthopedic device(s): none Showering: PRN with assistance VTE prophylaxis: Lovenox 40mg  qd x6 weeks, if ambulating well will transition to aspirin in clinic Pain control: PRN meds, minimize narcotics as able Follow - up plan: 7-14 days for incision check and XR in clinic Contact information: [name], PA-C. Dr. Ophelia Charter. Dispo: SNF   Will order post-op x-rays for patient's right hip today. Stable post-op imaging.   Prescriptions for Lovenox and Oxycodone printed and placed in patient's chart. From orthopedic standpoint, patient is cleared for discharge once cleared by medicine team and therapies.   [name], PA-C 02/11/2019

## 2019-02-12 LAB — C-REACTIVE PROTEIN: CRP: 4 mg/dL — ABNORMAL HIGH (ref <1.0)

## 2019-02-12 LAB — D-DIMER, QUANTITATIVE: D-Dimer, Quant: 5.6 ug/mL-FEU — ABNORMAL HIGH (ref 0.00–0.50)

## 2019-02-12 LAB — COMPREHENSIVE METABOLIC PANEL
ALT: 12 U/L (ref 0–44)
AST: 39 U/L (ref 15–41)
Albumin: 2.6 g/dL — ABNORMAL LOW (ref 3.5–5.0)
Alkaline Phosphatase: 65 U/L (ref 38–126)
Anion gap: 9 (ref 5–15)
BUN: 20 mg/dL (ref 8–23)
CO2: 30 mmol/L (ref 22–32)
Calcium: 8.8 mg/dL — ABNORMAL LOW (ref 8.9–10.3)
Chloride: 102 mmol/L (ref 98–111)
Creatinine, Ser: 0.58 mg/dL (ref 0.44–1.00)
GFR calc Af Amer: >60 mL/min (ref >60)
GFR calc non Af Amer: >60 mL/min (ref >60)
Glucose, Bld: 111 mg/dL — ABNORMAL HIGH (ref 70–99)
Potassium: 4.4 mmol/L (ref 3.5–5.1)
Sodium: 141 mmol/L (ref 135–145)
Total Bilirubin: 0.8 mg/dL (ref 0.3–1.2)
Total Protein: 5.7 g/dL — ABNORMAL LOW (ref 6.5–8.1)

## 2019-02-12 LAB — CBC
HCT: 27.4 % — ABNORMAL LOW (ref 36.0–46.0)
Hemoglobin: 8.7 g/dL — ABNORMAL LOW (ref 12.0–15.0)
MCH: 32.1 pg (ref 26.0–34.0)
MCHC: 31.8 g/dL (ref 30.0–36.0)
MCV: 101.1 fL — ABNORMAL HIGH (ref 80.0–100.0)
Platelets: 501 10*3/uL — ABNORMAL HIGH (ref 150–400)
RBC: 2.71 MIL/uL — ABNORMAL LOW (ref 3.87–5.11)
RDW: 15 % (ref 11.5–15.5)
WBC: 11.9 10*3/uL — ABNORMAL HIGH (ref 4.0–10.5)
nRBC: 0.2 % (ref 0.0–0.2)

## 2019-02-12 NOTE — Progress Notes (Signed)
PROGRESS NOTE                                                                                                                                                                                                             Patient Demographics:    [name], is a 85 y.o. female, DOB - Apr 27, 1934, [id number]  Outpatient Primary MD for the patient is [name], MD   Admit date - 02/03/2019   LOS - 9  Chief Complaint  Patient presents with  . Leg Pain       Brief Narrative: Patient is a 85 y.o. 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  See below for further details.   Subjective:   Pleasantly confused-nursing staff reports very poor oral intake-but it appears that she is eating some with assistance from nursing report.  Required reinsertion of Foley catheter last night due to acute urinary retention   Assessment  & Plan :   COVID-19 infection-no respiratory symptoms-on room air this morning.  Appears to be relatively asymptomatic-chest x-ray on 1/29 did not show pneumonia.  CRP was elevated but now downtrending-could be from orthopedic issues rather than pneumonia.  Fever: afebrile  O2 requirements:  SpO2: 97 % O2 Flow Rate (L/min): 2 L/min   COVID-19 Labs: Recent Labs    02/10/19 0231 02/11/19 0500 02/11/19 1039 02/12/19 0706  DDIMER 4.37* 5.54*  --  5.60*  FERRITIN 295  --   --   --   CRP 8.0*  --  4.4* 4.0*    No results found for: BNP  No results for input(s): PROCALCITON in the last 168 hours.  Lab Results  Component Value Date   SARSCOV2NAA POSITIVE (A) 02/03/2019     COVID-19 Medications: None  Prone/Incentive Spirometry: encouraged  incentive spirometry use 3-4/hour.  DVT  Prophylaxis  :  Lovenox   Elevated D-dimer: Lower extremity Doppler negative-not hypoxic-suspect elevated D-dimer from surgical issues rather than COVID-19-continue prophylactic Lovenox.  Right proximal femur fracture: S/p right cephalomedullary nail on 1/29.  Orthopedics following-recommendations are for WBAT RLE-Lovenox 40 mg daily x 6 weeks.  Comminuted fracture of the right olecranon: S/p ORIF on 2/3-orthopedics following.  NWB to RUE while in splint for 2 weeks, ROM to start gently after that.  Acute blood loss anemia from perioperative blood loss: Hemoglobin stable-s/p 1 unit of PRBC so far.  Follow.  Dementia: At risk for delirium-currently pleasantly confused-maintain delirium precautions.  Acute urinary retention: Foley catheter discontinued on 2/4-unsuccessful voiding trial-redeveloped urinary retention and Foley catheter was placed last night.  Will need outpatient voiding trial-continue Flomax.  Failure to thrive syndrome: Advanced dementia-unfortunately continues to have very poor oral intake-Per nursing report-she is now starting to eat some with assistance-continue supportive care-encourage consumption of nutritional supplements.  Nutrition Problem: Nutrition Problem: Increased nutrient needs Etiology: post-op healing Signs/Symptoms: estimated needs Interventions: MVI, Ensure Enlive (each supplement provides 350kcal and 20 grams of protein), Magic cup    Goals of care: DNR in place-Long discussion with patient's daughter-family is aware that patient now has severe failure to thrive syndrome-very poor oral intake-plan is to watch for a few days to see if she will start eating-if not we may have to consider hospice at some point.  Consults  :  Orthopedics  Procedures  :  1/29>> right cephalomedullary nail-right hip 2/3>> ORIF-right olecranon  ABG: No results found for: PHART, PCO2ART, PO2ART, HCO3, TCO2, ACIDBASEDEF, O2SAT  Condition -  Guarded  Family Communication  :   Daughter updated over the phone 2/6  Code Status :DNR  Diet :  Diet Order            Diet regular Room service appropriate? No; Fluid consistency: Thin  Diet effective now               Disposition Plan  :  SNF on discharge-if no improvement in severe failure to thrive syndrome then may need residential hospice  Barriers to discharge: Severe failure to thrive syndrome-poor oral intake-following clinical trajectory to see whether SNF vs Hospice on discharge  Antimicorbials  :    Anti-infectives (From admission, onward)   Start     Dose/Rate Route Frequency Ordered Stop   02/09/19 1830  ceFAZolin (ANCEF) IVPB 1 g/50 mL premix     1 g 100 mL/hr over 30 Minutes Intravenous Every 6 hours 02/09/19 1829 02/10/19 0910   02/09/19 1535  vancomycin (VANCOCIN) powder  Status:  Discontinued       As needed 02/09/19 1617 02/09/19 1620   02/09/19 0600  ceFAZolin (ANCEF) IVPB 2g/100 mL premix     2 g 200 mL/hr over 30 Minutes Intravenous On call to O.R. 02/04/19 1802 02/09/19 0623   02/07/19 1000  ceFAZolin (ANCEF) IVPB 2g/100 mL premix     2 g 200 mL/hr over 30 Minutes Intravenous To Short Stay 02/07/19 0800 02/08/19 1000   02/04/19 2200  ceFAZolin (ANCEF) IVPB 2g/100 mL premix     2 g 200 mL/hr over 30 Minutes Intravenous Every 8 hours 02/04/19 1802 02/05/19 0727      Inpatient Medications  Scheduled Meds: . acetaminophen  650 mg Oral TID  . vitamin C  500 mg Oral BID  . Chlorhexidine Gluconate Cloth  6 each Topical Daily  . enoxaparin (LOVENOX) injection  40 mg Subcutaneous Q24H  . feeding supplement (ENSURE ENLIVE)  237 mL Oral TID BM  . ferrous sulfate  325 mg Oral BID WC  . mouth rinse  15 mL Mouth Rinse BID  . multivitamin with minerals  1 tablet Oral Daily  . senna-docusate  2 tablet Oral BID  . tamsulosin  0.4 mg Oral Daily  . cholecalciferol  1,000 Units  Oral Daily  . zinc sulfate  220 mg Oral Daily   Continuous Infusions: . sodium chloride 1,000 mL (02/10/19 2254)     PRN Meds:.sodium chloride, bisacodyl, HYDROmorphone (DILAUDID) injection, lip balm, magnesium citrate, menthol-cetylpyridinium **OR** phenol, ondansetron **OR** ondansetron (ZOFRAN) IV, oxyCODONE, polyethylene glycol   Time Spent in minutes  25  See all Orders from today for further details   Oren Binet M.D on 02/12/2019 at 10:13 AM  To page go to [email or web address] - use universal password  [facility] -  Office  [phone]    Objective:   Vitals:   02/11/19 1946 02/12/19 0417 02/12/19 0527 02/12/19 0915  BP: (!) 97/43 (!) 113/59  123/74  Pulse: (!) 117 92 89 96  Resp: (!) 26 18 (!) 21 (!) 23  Temp: 98.2 F (36.8 C) 98.5 F (36.9 C)  98.8 F (37.1 C)  TempSrc: Oral Oral  Axillary  SpO2: 96% 93% 97% 97%  Weight:   46.2 kg   Height:        Wt Readings from Last 3 Encounters:  02/12/19 46.2 kg  02/03/19 41.7 kg  09/06/18 40.4 kg     Intake/Output Summary (Last 24 hours) at 02/12/2019 1013 Last data filed at 02/12/2019 0800 Gross per 24 hour  Intake 560 ml  Output 600 ml  Net -40 ml     Physical Exam Gen Exam: Pleasantly confused-not in distress. HEENT:atraumatic, normocephalic Chest: B/L clear to auscultation anteriorly CVS:S1S2 regular Abdomen:soft non tender, non distended Extremities:no edema Neurology: Moves all 4 extremities.   Skin: no rash   Data Review:    CBC Recent Labs  Lab 02/07/19 0222 02/08/19 1147 02/10/19 0231 02/11/19 1039 02/12/19 0726  WBC 8.5 8.3 12.6* 11.5* 11.9*  HGB 8.7* 9.4* 9.2* 8.9* 8.7*  HCT 25.6* 28.7* 28.8* 28.5* 27.4*  PLT 307 437* 457* 534* 501*  MCV 94.8 97.0 101.8* 102.5* 101.1*  MCH 32.2 31.8 32.5 32.0 32.1  MCHC 34.0 32.8 31.9 31.2 31.8  RDW 13.2 13.5 14.0 14.6 15.0  LYMPHSABS  --  1.3  --   --   --   MONOABS  --  1.0  --   --   --   EOSABS  --  0.1  --   --   --   BASOSABS  --  0.0  --   --   --     Chemistries  Recent Labs  Lab 02/06/19 0140 02/06/19 0614 02/07/19 0222 02/07/19 0222  02/08/19 1147 02/10/19 0231 02/11/19 0500 02/11/19 1039 02/12/19 0726  NA   < >  --  138  --  141 138  --  141 141  K   < >  --  3.7  --  3.5 4.3  --  4.0 4.4  CL   < >  --  104  --  103 103  --  102 102  CO2   < >  --  26  --  26 25  --  29 30  GLUCOSE   < >  --  116*  --  127* 125*  --  131* 111*  BUN   < >  --  19  --  16 22  --  20 20  CREATININE   < >  --  0.50   < > 0.63 0.73 0.64 0.66 0.58  CALCIUM   < >  --  8.2*  --  8.9 8.5*  --  8.8* 8.8*  MG  --  2.1  --   --   --   --   --   --   --  AST  --   --   --   --   --  51*  --  59* 39  ALT  --   --   --   --   --  22  --  16 12  ALKPHOS  --   --   --   --   --  59  --  71 65  BILITOT  --   --   --   --   --  1.1  --  1.1 0.8   < > = values in this interval not displayed.   ------------------------------------------------------------------------------------------------------------------ No results for input(s): CHOL, HDL, LDLCALC, TRIG, CHOLHDL, LDLDIRECT in the last 72 hours.  No results found for: HGBA1C ------------------------------------------------------------------------------------------------------------------ No results for input(s): TSH, T4TOTAL, T3FREE, THYROIDAB in the last 72 hours.  Invalid input(s): FREET3 ------------------------------------------------------------------------------------------------------------------ Recent Labs    02/10/19 0231  FERRITIN 295    Coagulation profile No results for input(s): INR, PROTIME in the last 168 hours.  Recent Labs    02/11/19 0500 02/12/19 0706  DDIMER 5.54* 5.60*    Cardiac Enzymes No results for input(s): CKMB, TROPONINI, MYOGLOBIN in the last 168 hours.  Invalid input(s): CK ------------------------------------------------------------------------------------------------------------------ No results found for: BNP  Micro Results Recent Results (from the past 240 hour(s))  Respiratory Panel by RT PCR (Flu A&B, Covid) - Nasopharyngeal Swab      Status: Abnormal   Collection Time: 02/03/19  7:19 PM   Specimen: Nasopharyngeal Swab  Result Value Ref Range Status   SARS Coronavirus 2 by RT PCR POSITIVE (A) NEGATIVE Final    Comment: RESULT CALLED TO, READ BACK BY AND VERIFIED WITH: T WALKER,RN @2058  02/03/19 MKELLY (NOTE) SARS-CoV-2 target nucleic acids are DETECTED. SARS-CoV-2 RNA is generally detectable in upper respiratory specimens  during the acute phase of infection. Positive results are indicative of the presence of the identified virus, but do not rule out bacterial infection or co-infection with other pathogens not detected by the test. Clinical correlation with patient history and other diagnostic information is necessary to determine patient infection status. The expected result is Negative. Fact Sheet for Patients:  PinkCheek.be Fact Sheet for Healthcare Providers: GravelBags.it This test is not yet approved or cleared by the [place] FDA and  has been authorized for detection and/or diagnosis of SARS-CoV-2 by FDA under an Emergency Use Authorization (EUA).  This EUA will remain in effect (meaning this test can be used) for  the duration of  the COVID-19 declaration under Section 564(b)(1) of the Act, 21 U.S.C. section 360bbb-3(b)(1), unless the authorization is terminated or revoked sooner.    Influenza A by PCR NEGATIVE NEGATIVE Final   Influenza B by PCR NEGATIVE NEGATIVE Final    Comment: (NOTE) The Xpert Xpress SARS-CoV-2/FLU/RSV assay is intended as an aid in  the diagnosis of influenza from Nasopharyngeal swab specimens and  should not be used as a sole basis for treatment. Nasal washings and  aspirates are unacceptable for Xpert Xpress SARS-CoV-2/FLU/RSV  testing. Fact Sheet for Patients: PinkCheek.be Fact Sheet for Healthcare Providers: GravelBags.it This test is not yet approved or  cleared by the [place] FDA and  has been authorized for detection and/or diagnosis of SARS-CoV-2 by  FDA under an Emergency Use Authorization (EUA). This EUA will remain  in effect (meaning this test can be used) for the duration of the  Covid-19 declaration under Section 564(b)(1) of the Act, 21  U.S.C. section 360bbb-3(b)(1), unless the authorization is  terminated or revoked.  Performed at [facility], [address] [place], [place] [address]   Surgical PCR screen     Status: None   Collection Time: 02/04/19  1:12 PM   Specimen: Nasal Mucosa; Nasal Swab  Result Value Ref Range Status   MRSA, PCR NEGATIVE NEGATIVE Final   Staphylococcus aureus NEGATIVE NEGATIVE Final    Comment: (NOTE) The Xpert SA Assay (FDA approved for NASAL specimens in patients 19 years of age and older), is one component of a comprehensive surveillance program. It is not intended to diagnose infection nor to guide or monitor treatment. Performed at [facility], [place] [address] [place], [place] [address]     Radiology Reports DG Chest 2 View  Result Date: 02/03/2019 CLINICAL DATA:  Fall, confusion at baseline EXAM: CHEST - 2 VIEW COMPARISON:  In a 01/23/2012, 01/27/2008 FINDINGS: Normal heart size, mediastinal contours, and pulmonary vascularity. Atherosclerotic calcification aorta. Emphysematous and bronchitic changes consistent with COPD. Interstitial prominence, chronic. No infiltrate, pleural effusion or pneumothorax. Bones demineralized with mark compression deformity of a lower thoracic vertebra, age indeterminate but new since 2010. IMPRESSION: COPD changes without acute infiltrate. Age-indeterminate compression fracture of a lower thoracic vertebra. Electronically Signed   By: Lavonia Dana M.D.   On: 02/03/2019 10:59   DG Pelvis 1-2 Views  Result Date: 02/03/2019 CLINICAL DATA:  Status post fall. EXAM: PELVIS - 1-2 VIEW COMPARISON:  None. FINDINGS: An acute fracture deformity is seen  involving the proximal right femoral shaft. This is just below the intertrochanteric region. Lateral angulation of the fracture site is seen. Mild to moderate severity degenerative changes seen involving the bilateral hips. IMPRESSION: Acute fracture of the proximal right femur. Electronically Signed   By: Virgina Norfolk M.D.   On: 02/03/2019 19:11   DG Pelvis 1-2 Views  Result Date: 02/03/2019 CLINICAL DATA:  Fall, baseline confusion, pain in pelvis when moving for imaging EXAM: PELVIS - 1-2 VIEW COMPARISON:  None FINDINGS: Osseous demineralization. RIGHT SI joint poorly profiled. At LEFT SI joint and hip joint spaces preserved. No definite fracture, dislocation, or bone destruction. IMPRESSION: No definite acute osseous abnormalities. Electronically Signed   By: Lavonia Dana M.D.   On: 02/03/2019 11:00   DG Elbow 2 Views Right  Result Date: 02/09/2019 CLINICAL DATA:  Postoperative evaluation. EXAM: RIGHT ELBOW - 2 VIEW COMPARISON:  February 03, 2019 FINDINGS: It should be noted that the right elbow was imaged in a plaster cast with subsequently obscured osseous and soft tissue detail. A large radiopaque fixation screw is seen within the proximal right ulna. An acute fracture is not identified. There is no evidence of arthropathy or other focal bone abnormality. Soft tissues are unremarkable. IMPRESSION: Prior open reduction internal fixation of the proximal right ulna. Electronically Signed   By: Virgina Norfolk M.D.   On: 02/09/2019 18:58   DG Elbow Complete Right  Result Date: 02/03/2019 CLINICAL DATA:  Unwitnessed fall, swelling and bruising RIGHT elbow EXAM: RIGHT ELBOW - COMPLETE 3+ VIEW COMPARISON:  None FINDINGS: Osseous demineralization. Soft tissue swelling overlying the elbow and proximal forearm. Intra-articular olecranon fracture, proximal fragment distracted 2.3 cm. Humerus and radius intact. IMPRESSION: Distracted intra-articular olecranon fracture with significant overlying soft tissue  swelling. Electronically Signed   By: Lavonia Dana M.D.   On: 02/03/2019 10:57   CT Head Wo Contrast  Result Date: 02/03/2019 CLINICAL DATA:  Trauma to the head and neck. EXAM: CT HEAD WITHOUT CONTRAST CT CERVICAL SPINE WITHOUT CONTRAST TECHNIQUE: Multidetector CT imaging  of the head and cervical spine was performed following the standard protocol without intravenous contrast. Multiplanar CT image reconstructions of the cervical spine were also generated. COMPARISON:  01/27/2012 FINDINGS: CT HEAD FINDINGS Brain: The brain does not show accelerated generalized atrophy. No focal finding affects the brainstem or cerebellum. 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 Mild chronic small-vessel change of the hemispheric white matter. No hydrocephalus, hemorrhage or extra-axial collection. Vascular: There is atherosclerotic calcification of the major vessels at the base of the brain. Skull: Left pterional craniotomy for previous aneurysm clipping. Otherwise negative. Sinuses/Orbits: Clear/normal Other: None CT CERVICAL SPINE FINDINGS Alignment: Normal except for 2 mm of degenerative anterolisthesis at C4-5. Skull base and vertebrae: No skull base fracture. No cervical spine fracture. Compression fracture at the superior endplate of T1 with loss of height of 10% or less. This is age indeterminate but could be recent. Soft tissues and spinal canal: Negative Disc levels: Chronic spondylosis at C5-6. Mild bilateral bony foraminal narrowing. Chronic facet osteoarthritis on the left at C4-5. Left bony foraminal encroachment because of this. Upper chest: Negative Other: None IMPRESSION: Head CT: No acute or traumatic finding. Previous left pterional craniotomy for aneurysm clipping. Cervical spine CT: No cervical spine fracture. Minimal superior endplate compression fracture at T1 with loss of height of 10%. This is age indeterminate but could  possibly be recent. Degenerative spondylosis at C5-6. Degenerative facet arthritis on the left at C4-5. Electronically Signed   By: Nelson Chimes M.D.   On: 02/03/2019 11:13   CT Cervical Spine Wo Contrast  Result Date: 02/03/2019 CLINICAL DATA:  Trauma to the head and neck. EXAM: CT HEAD WITHOUT CONTRAST CT CERVICAL SPINE WITHOUT CONTRAST TECHNIQUE: Multidetector CT imaging of the head and cervical spine was performed following the standard protocol without intravenous contrast. Multiplanar CT image reconstructions of the cervical spine were also generated. COMPARISON:  01/27/2012 FINDINGS: CT HEAD FINDINGS Brain: The brain does not show accelerated generalized atrophy. No focal finding affects the brainstem or cerebellum. 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 Mild chronic small-vessel change of the hemispheric white matter. No hydrocephalus, hemorrhage or extra-axial collection. Vascular: There is atherosclerotic calcification of the major vessels at the base of the brain. Skull: Left pterional craniotomy for previous aneurysm clipping. Otherwise negative. Sinuses/Orbits: Clear/normal Other: None CT CERVICAL SPINE FINDINGS Alignment: Normal except for 2 mm of degenerative anterolisthesis at C4-5. Skull base and vertebrae: No skull base fracture. No cervical spine fracture. Compression fracture at the superior endplate of T1 with loss of height of 10% or less. This is age indeterminate but could be recent. Soft tissues and spinal canal: Negative Disc levels: Chronic spondylosis at C5-6. Mild bilateral bony foraminal narrowing. Chronic facet osteoarthritis on the left at C4-5. Left bony foraminal encroachment because of this. Upper chest: Negative Other: None IMPRESSION: Head CT: No acute or traumatic finding. Previous left pterional craniotomy for aneurysm clipping. Cervical spine CT: No cervical spine fracture. Minimal superior  endplate compression fracture at T1 with loss of height of 10%. This is age indeterminate but could possibly be recent. Degenerative spondylosis at C5-6. Degenerative facet arthritis on the left at C4-5. Electronically Signed   By: Nelson Chimes M.D.   On: 02/03/2019 11:13   CT Hip Right Wo Contrast  Result Date: 02/03/2019 CLINICAL DATA:  Hip fracture EXAM: CT  OF THE RIGHT HIP WITHOUT CONTRAST TECHNIQUE: Multidetector CT imaging of the right hip was performed according to the standard protocol. Multiplanar CT image reconstructions were also generated. COMPARISON:  Femur fracture 02/03/2019 FINDINGS: Bones/Joint/Cartilage The osseous structures appear diffusely demineralized which may limit detection of small or nondisplaced fractures. Comminuted extracapsular right femur fracture with predominantly intertrochanteric extension but with complete separation of the greater and lesser trochanters from both the femoral neck and the subtrochanteric region (AO/OTA A3.3). This is an unstable femur fracture. Hyperdense hemorrhage is noted within the bony trabecula a about the fracture site. The femoral head remains normally located. There are chronic degenerative changes of the acetabuli. Periacetabular spurring and os acetabuli likely reflects sequela of prior glenoid degenerative change. Remaining imaged portions of the pelvic bones are intact. Ligaments Suboptimally assessed by CT. Muscles and Tendons There is soft tissue swelling and edematous changes throughout the musculature of the proximal thigh. No large joint effusion is seen likely as this fracture is extracapsular in nature. Soft tissues Extensive right hip soft tissue swelling and edematous changes. Hemorrhage about the fracture line within the bony trabecula, as detailed above. Included portions of the pelvis demonstrates some atherosclerosis and un inflamed colonic diverticula but are otherwise unremarkable. IMPRESSION: Unstable comminuted extracapsular  right femur fracture with predominantly intertrochanteric extension but with complete separation of the greater and lesser trochanters from both the femoral neck and the subtrochanteric region (AO/OTA A3.3). Hyperdense hemorrhage is noted within the bony trabecula about the fracture site. No large joint effusion likely as this fracture is extracapsular in nature. Associated right hip soft tissue swelling and edema. More chronic degenerative changes the right Electronically Signed   By: Lovena Le M.D.   On: 02/03/2019 22:47   CT ELBOW RIGHT WO CONTRAST  Result Date: 02/07/2019 CLINICAL DATA:  Right elbow pain status post fall, preop 02/09/2019. EXAM: CT OF THE UPPER RIGHT EXTREMITY WITHOUT CONTRAST TECHNIQUE: Multidetector CT imaging of the upper right extremity was performed according to the standard protocol. COMPARISON:  None. FINDINGS: Bones/Joint/Cartilage Limited evaluation secondary to positioning. Comminuted fracture of the olecranon with 2 cm of distraction. Mild cortical irregularity along the posterior aspect of the capitellum concerning for small marginal osteophyte versus a subtle nondisplaced fracture (image 31/series 4). Large joint effusion. No other acute fracture or dislocation. No aggressive osseous lesion. Ligaments Suboptimally assessed by CT. Muscles and Tendons Muscles are normal. No muscle atrophy. Biceps tendon is intact. Triceps tendon is intact. Soft tissues No fluid collection or hematoma. IMPRESSION: Comminuted fracture of the olecranon with 2 cm of distraction. Mild cortical irregularity along the posterior aspect of the capitellum concerning for small marginal osteophyte versus a subtle nondisplaced fracture (image 31/series 4). Electronically Signed   By: Kathreen Devoid   On: 02/07/2019 16:39   DG CHEST PORT 1 VIEW  Result Date: 02/04/2019 CLINICAL DATA:  Oxygen desaturation. EXAM: PORTABLE CHEST 1 VIEW COMPARISON:  February 03, 2019 FINDINGS: The heart, hila, and mediastinum  are normal. No pneumothorax. No nodules or masses. No focal infiltrates. IMPRESSION: No active disease. Electronically Signed   By: Dorise Bullion III M.D   On: 02/04/2019 19:52   DG C-Arm 1-60 Min  Result Date: 02/04/2019 CLINICAL DATA:  Femur fracture fixation EXAM: DG C-ARM 1-60 MIN; RIGHT FEMUR 2 VIEWS FLUOROSCOPY TIME:  Fluoroscopy Time:  1 minutes 50 seconds Number of Acquired Spot Images: 4 COMPARISON:  None. FINDINGS: Intraoperative radiographs demonstrate trochanteric femoral nail and intramedullary rod fixation proximal right femur shaft fracture. There  are 2 distal interlocking screws. Alignment appears improved. IMPRESSION: Fluoroscopic guidance for fixation of right femur fracture with improved alignment Electronically Signed   By: Macy Mis M.D.   On: 02/04/2019 16:51   DG HIP PORT UNILAT WITH PELVIS 1V RIGHT  Result Date: 02/11/2019 CLINICAL DATA:  Postoperative evaluation. EXAM: DG HIP (WITH OR WITHOUT PELVIS) 1V PORT RIGHT COMPARISON:  02/04/2019. FINDINGS: Patient status post ORIF right femur. Hardware intact and in stable position. Stable alignment. Diffuse osteopenia. Degenerative changes both hips. Pelvic calcifications consistent phleboliths. IMPRESSION: ORIF right femur.  Hardware intact.  Stable alignment. Electronically Signed   By: Marcello Moores  Register   On: 02/11/2019 08:44   DG FEMUR, MIN 2 VIEWS RIGHT  Result Date: 02/04/2019 CLINICAL DATA:  Femur fracture fixation EXAM: DG C-ARM 1-60 MIN; RIGHT FEMUR 2 VIEWS FLUOROSCOPY TIME:  Fluoroscopy Time:  1 minutes 50 seconds Number of Acquired Spot Images: 4 COMPARISON:  None. FINDINGS: Intraoperative radiographs demonstrate trochanteric femoral nail and intramedullary rod fixation proximal right femur shaft fracture. There are 2 distal interlocking screws. Alignment appears improved. IMPRESSION: Fluoroscopic guidance for fixation of right femur fracture with improved alignment Electronically Signed   By: Macy Mis M.D.   On:  02/04/2019 16:51   DG FEMUR PORT, MIN 2 VIEWS RIGHT  Result Date: 02/04/2019 CLINICAL DATA:  Postoperative evaluation. EXAM: RIGHT FEMUR PORTABLE 2 VIEW COMPARISON:  None. FINDINGS: A radiopaque intramedullary rod is seen along the length of the right femur. A radiopaque compression screw device is noted within the right femoral head and neck. An inter trochanteric fracture of the proximal right femur is seen with gross anatomic alignment. Soft tissues are unremarkable. IMPRESSION: Status post open reduction and internal fixation of the proximal right femur. Electronically Signed   By: Virgina Norfolk M.D.   On: 02/04/2019 18:07   DG Femur Portable Min 2 Views Right  Result Date: 02/03/2019 CLINICAL DATA:  Status post fall. EXAM: RIGHT FEMUR PORTABLE 2 VIEW COMPARISON:  None. FINDINGS: Acute fracture deformity is seen involving shaft of the proximal right femur. This extends to the level just below the intertrochanteric region. Superolateral angulation of the fracture site is seen. There is no evidence of dislocation. IMPRESSION: Acute fracture of the proximal right femur. Electronically Signed   By: Virgina Norfolk M.D.   On: 02/03/2019 19:12   VAS [place] LOWER EXTREMITY VENOUS (DVT)  Result Date: 02/11/2019  Lower Venous DVTStudy Indications: Edema, and Covid+, Elevated D dimer.  Limitations: Patient is contracted, right hip fracture, and uncooperative. Comparison Study: No prior exam. Performing Technologist: Baldwin Crown RDMS, RVT  Examination Guidelines: A complete evaluation includes B-mode imaging, spectral Doppler, color Doppler, and power Doppler as needed of all accessible portions of each vessel. Bilateral testing is considered an integral part of a complete examination. Limited examinations for reoccurring indications may be performed as noted. The reflux portion of the exam is performed with the patient in reverse Trendelenburg.   +---------+---------------+---------+-----------+----------+--------------+ RIGHT    CompressibilityPhasicitySpontaneityPropertiesThrombus Aging +---------+---------------+---------+-----------+----------+--------------+ CFV      Full           Yes      Yes                                 +---------+---------------+---------+-----------+----------+--------------+ SFJ      Full                                                        +---------+---------------+---------+-----------+----------+--------------+  FV Prox  Full                                                        +---------+---------------+---------+-----------+----------+--------------+ FV Mid   Full                                                        +---------+---------------+---------+-----------+----------+--------------+ FV DistalFull                                                        +---------+---------------+---------+-----------+----------+--------------+ PFV      Full                                                        +---------+---------------+---------+-----------+----------+--------------+ POP      Full           Yes      Yes                                 +---------+---------------+---------+-----------+----------+--------------+ PTV      Full                                                        +---------+---------------+---------+-----------+----------+--------------+ PERO                                                  Not visualized +---------+---------------+---------+-----------+----------+--------------+   +---------+---------------+---------+-----------+----------+--------------+ LEFT     CompressibilityPhasicitySpontaneityPropertiesThrombus Aging +---------+---------------+---------+-----------+----------+--------------+ CFV      Full           Yes      Yes                                  +---------+---------------+---------+-----------+----------+--------------+ SFJ      Full                                                        +---------+---------------+---------+-----------+----------+--------------+ FV Prox  Full                                                        +---------+---------------+---------+-----------+----------+--------------+  FV Mid   Full                                                        +---------+---------------+---------+-----------+----------+--------------+ FV DistalFull                                                        +---------+---------------+---------+-----------+----------+--------------+ PFV      Full                                                        +---------+---------------+---------+-----------+----------+--------------+ POP      Full                                                        +---------+---------------+---------+-----------+----------+--------------+ PTV      Full                                                        +---------+---------------+---------+-----------+----------+--------------+ PERO                                                  Not visualized +---------+---------------+---------+-----------+----------+--------------+ Poorly visuaslized calf veins due to patient contracted and uncooperative.    Summary: BILATERAL: - No evidence of deep vein thrombosis seen in the lower extremities, bilaterally.  RIGHT: - No cystic structure found in the popliteal fossa.  LEFT: - No cystic structure found in the popliteal fossa.  *See table(s) above for measurements and observations.    Preliminary

## 2019-02-12 NOTE — Progress Notes (Addendum)
   Vital Signs MEWS/VS Documentation      02/12/2019 1220 02/12/2019 1235 02/12/2019 1240 02/12/2019 1500   MEWS Score:  3  --  3  1   MEWS Score Color:  Yellow  --  Yellow  Green   Resp:  (!) 34  --  (!) 22  20   Pulse:  (!) 104  --  (!) 104  (!) 105   BP:  (!) 107/93  --  90/66  (!) 105/49   Temp:  97.8 F (36.6 C)  --  99.6 F (37.6 C)  99.4 F (37.4 C)   O2 Device:  Room Air  --  Room Air  Room Air   Level of Consciousness:  Alert  --  Alert  Alert       Patient MEWS score is a 3 at 12:20,  MD. Sloan Leiter notify , patient is not in acute distress, no SOB, or discomfort. Will continue to monitor.   Naithen Rivenburg A Rivera Montejano 02/12/2019,3:15 PM

## 2019-02-13 LAB — C-REACTIVE PROTEIN: CRP: 5.1 mg/dL — ABNORMAL HIGH (ref <1.0)

## 2019-02-13 LAB — BASIC METABOLIC PANEL
Anion gap: 10 (ref 5–15)
BUN: 21 mg/dL (ref 8–23)
CO2: 27 mmol/L (ref 22–32)
Calcium: 8.9 mg/dL (ref 8.9–10.3)
Chloride: 104 mmol/L (ref 98–111)
Creatinine, Ser: 0.63 mg/dL (ref 0.44–1.00)
GFR calc Af Amer: >60 mL/min (ref >60)
GFR calc non Af Amer: >60 mL/min (ref >60)
Glucose, Bld: 103 mg/dL — ABNORMAL HIGH (ref 70–99)
Potassium: 4 mmol/L (ref 3.5–5.1)
Sodium: 141 mmol/L (ref 135–145)

## 2019-02-13 LAB — D-DIMER, QUANTITATIVE: D-Dimer, Quant: 6.69 ug/mL-FEU — ABNORMAL HIGH (ref 0.00–0.50)

## 2019-02-13 NOTE — Progress Notes (Addendum)
Palliative Medicine Inpatient Follow Up Note   HPI: Per recent hospitalist note --> 85 year old lady prior history of dementia, cerebral artery aneurysm presented to ER with of right leg pain following a mechanical fall. On arrival to ED she underwent x-rays showing a right intra-articular fracture and was given a posterior splint and discharged home. She had another fall after returning home and sustained injury to the right leg. X-rays of the right leg show acute fracture of the proximal right femur with lateral angulation just below the intertrochanteric region.Patient also tested positive for Covidon admission. Hospital course complicated by post op hypoxia and anemia of blood loss.  Today's Discussion (02/12/2019): Met with [name] at bedside. She was in good spirits evaluating an ensure bottle in front of her. Per her bedside RN she had eaten some breakfast and lunch and drank 1.5 ensures. Patient did not endorse pain of discomfort.   Discussed the importance of continued conversation with family and their  medical providers regarding overall plan of care and treatment options, ensuring decisions are within the context of the patients values and GOCs.  Questions and concerns addressed   Vital Signs Vitals:   02/12/19 1500 02/12/19 2243  BP: (!) 105/49 98/69  Pulse: (!) 105 94  Resp: 20 18  Temp: 99.4 F (37.4 C) 99 F (37.2 C)  SpO2: 95% 96%    Intake/Output Summary (Last 24 hours) at 02/13/2019 1002 Last data filed at 02/13/2019 0330 Gross per 24 hour  Intake 710 ml  Output 1400 ml  Net -690 ml   Last Weight  Most recent update: 02/12/2019  5:27 AM   Weight  46.2 kg (101 lb 13.6 oz)           Gen:  Elderly F in NAD HEENT: dry mucous membranes CV: Irregular rate and regular rhythm, no murmurs rubs or gallops PULM: clear to auscultation bilaterally  ABD: soft/nontender/nondistended/normal bowel sounds  EXT: RLE edema Neuro: Alert and oriented x1  Recommendations and  Plan: Goals of Care: - MOST completed  Failure to Thrive:  - 1:1 feedings  - Supplementals Right Hip Pain: Right elbow pain:                 - Tylenol '650mg'$  PO TID                 - Oxycodone 2.5-20m PO Q4H PRN                 - Dilaudid 0.'5mg'$  IVP Q4H PRN for breakthrough pain  (short term use) Constipation: - Miralax 17g PO QDay PRN                 - Senna 2 tabs PO BID Muscular Weakness: - PT Eval - OT Eval Urinary Retention:                - Catheter in placeDelirium: - Delirium precautions - Get up during the day - Encourage a familiar face to remain present throughout the day - Keep blinds open and lights on during daylight hours - Minimize the use of benzodiazepines                 - Utilize opioids for short term unless already tolerant  Spiritual: - Chaplain consult Dispo:  - Patients daughter hopeful for improvement but realistic about current state and lack of PO intake.   - Plan for SNF placement  - TOC --> OP Palliative FU  - If patient does not  improve at SNF plan for transition to hospice home   Time Spent: 15 Greater than 50% of the time was spent in counseling and coordination of care ______________________________________________________________________________________ [place] Team Team Cell Phone: [phone] Please utilize secure chat with additional questions, if there is no response within 30 minutes please call the above phone number  Palliative Medicine Team providers are available by phone from 7am to 7pm daily and can be reached through the team cell phone.  Should this patient require assistance outside of these hours, please call the patient's attending physician.

## 2019-02-13 NOTE — Progress Notes (Signed)
PROGRESS NOTE                                                                                                                                                                                                             Patient Demographics:    [name], is a 85 y.o. female, DOB - 1934/11/07, [id number]  Outpatient Primary MD for the patient is [name], MD   Admit date - 02/03/2019   LOS - 10  Chief Complaint  Patient presents with  . Leg Pain       Brief Narrative: Patient is a 85 y.o. 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  See below for further details.   Subjective:   Remains pleasantly confused-Per nursing report-eating around 25% of her meals.  She is also consuming around 50% of the nutritional supplements.   Assessment  & Plan :   COVID-19 infection-no respiratory symptoms-on room air this morning.  Appears to be relatively asymptomatic-chest x-ray on 1/29 did not show pneumonia.  CRP was elevated but now downtrending-could be from orthopedic issues rather than pneumonia.  Fever: afebrile  O2 requirements:  SpO2: 96 % O2 Flow Rate (L/min): 2 L/min   COVID-19 Labs: Recent Labs    02/11/19 0500 02/11/19 1039 02/12/19 0706 02/13/19 0237  DDIMER 5.54*  --  5.60* 6.69*  CRP  --  4.4* 4.0* 5.1*    No results found for: BNP  No results for input(s): PROCALCITON in the last 168 hours.  Lab Results  Component Value Date   SARSCOV2NAA POSITIVE (A) 02/03/2019     COVID-19 Medications: None  Prone/Incentive Spirometry: encouraged  incentive spirometry use 3-4/hour.  DVT Prophylaxis  :  Lovenox   Elevated D-dimer: Lower extremity Doppler negative-not hypoxic-suspect elevated  D-dimer from surgical issues rather than COVID-19-continue prophylactic Lovenox.  Right proximal femur fracture: S/p right cephalomedullary nail on 1/29.  Orthopedics following-recommendations are for WBAT RLE-Lovenox 40 mg daily x 6 weeks.  Comminuted fracture of the right olecranon: S/p ORIF on 2/3-orthopedics following.  NWB to RUE while in splint for 2 weeks, ROM to start gently after that.  Acute blood loss anemia from perioperative blood  loss: Hemoglobin stable-s/p 1 unit of PRBC so far.  Follow.  Dementia: At risk for delirium-currently pleasantly confused-maintain delirium precautions.  Acute urinary retention: Foley catheter discontinued on 2/4-unsuccessful voiding trial-redeveloped urinary retention and Foley catheter was reinserted.Will need outpatient voiding trial-continue Flomax.  Failure to thrive syndrome: Advanced dementia-unfortunately continues to have very poor oral intake-which seems to be slowly improving-Per nursing staff-able to consume around 25% of her meals along with some amount of her nutritional supplements.  Nutrition Problem: Nutrition Problem: Increased nutrient needs Etiology: post-op healing Signs/Symptoms: estimated needs Interventions: MVI, Ensure Enlive (each supplement provides 350kcal and 20 grams of protein), Magic cup    Goals of care: DNR in place-Long discussion with patient's daughter-family is aware that patient now has severe failure to thrive syndrome-although has poor oral intake-she appears stable-and suspect that she is probably a better candidate for SNF rather than hospice at this point.  Consults  :  Orthopedics  Procedures  :  1/29>> right cephalomedullary nail-right hip 2/3>> ORIF-right olecranon  ABG: No results found for: PHART, PCO2ART, PO2ART, HCO3, TCO2, ACIDBASEDEF, O2SAT  Condition -  Guarded  Family Communication  :  Daughter updated over the phone 2/7  Code Status :DNR  Diet :  Diet Order            Diet regular  Room service appropriate? No; Fluid consistency: Thin  Diet effective now               Disposition Plan  :  SNF on discharge-hopefully in the next day or so when SNF bed available.  Barriers to discharge: Severe failure to thrive syndrome-poor oral intake-though improving-awaiting SNF bed.  Antimicorbials  :    Anti-infectives (From admission, onward)   Start     Dose/Rate Route Frequency Ordered Stop   02/09/19 1830  ceFAZolin (ANCEF) IVPB 1 g/50 mL premix     1 g 100 mL/hr over 30 Minutes Intravenous Every 6 hours 02/09/19 1829 02/10/19 0910   02/09/19 1535  vancomycin (VANCOCIN) powder  Status:  Discontinued       As needed 02/09/19 1617 02/09/19 1620   02/09/19 0600  ceFAZolin (ANCEF) IVPB 2g/100 mL premix     2 g 200 mL/hr over 30 Minutes Intravenous On call to O.R. 02/04/19 1802 02/09/19 0623   02/07/19 1000  ceFAZolin (ANCEF) IVPB 2g/100 mL premix     2 g 200 mL/hr over 30 Minutes Intravenous To Short Stay 02/07/19 0800 02/08/19 1000   02/04/19 2200  ceFAZolin (ANCEF) IVPB 2g/100 mL premix     2 g 200 mL/hr over 30 Minutes Intravenous Every 8 hours 02/04/19 1802 02/05/19 0727      Inpatient Medications  Scheduled Meds: . acetaminophen  650 mg Oral TID  . vitamin C  500 mg Oral BID  . Chlorhexidine Gluconate Cloth  6 each Topical Daily  . enoxaparin (LOVENOX) injection  40 mg Subcutaneous Q24H  . feeding supplement (ENSURE ENLIVE)  237 mL Oral TID BM  . ferrous sulfate  325 mg Oral BID WC  . mouth rinse  15 mL Mouth Rinse BID  . multivitamin with minerals  1 tablet Oral Daily  . senna-docusate  2 tablet Oral BID  . tamsulosin  0.4 mg Oral Daily  . cholecalciferol  1,000 Units Oral Daily  . zinc sulfate  220 mg Oral Daily   Continuous Infusions: . sodium chloride 1,000 mL (02/10/19 2254)   PRN Meds:.sodium chloride, bisacodyl, HYDROmorphone (DILAUDID) injection, lip balm, magnesium citrate, menthol-cetylpyridinium **  OR** phenol, ondansetron **OR** ondansetron  (ZOFRAN) IV, oxyCODONE, polyethylene glycol   Time Spent in minutes  25  See all Orders from today for further details   Oren Binet M.D on 02/13/2019 at 12:02 PM  To page go to [email or web address] - use universal password  [facility] -  Office  [phone]    Objective:   Vitals:   02/12/19 1220 02/12/19 1240 02/12/19 1500 02/12/19 2243  BP: (!) 107/93 90/66 (!) 105/49 98/69  Pulse: (!) 104 (!) 104 (!) 105 94  Resp: (!) 34 (!) 22 20 18   Temp: 97.8 F (36.6 C) 99.6 F (37.6 C) 99.4 F (37.4 C) 99 F (37.2 C)  TempSrc: Oral Axillary Axillary Oral  SpO2: 96% 96% 95% 96%  Weight:      Height:        Wt Readings from Last 3 Encounters:  02/12/19 46.2 kg  02/03/19 41.7 kg  09/06/18 40.4 kg     Intake/Output Summary (Last 24 hours) at 02/13/2019 1202 Last data filed at 02/13/2019 0900 Gross per 24 hour  Intake 950 ml  Output 1400 ml  Net -450 ml     Physical Exam Gen Exam: Pleasantly confused-not in any distress HEENT:atraumatic, normocephalic Chest: B/L clear to auscultation anteriorly CVS:S1S2 regular Abdomen:soft non tender, non distended Extremities:no edema Neurology: Non focal Skin: no rash   Data Review:    CBC Recent Labs  Lab 02/07/19 0222 02/08/19 1147 02/10/19 0231 02/11/19 1039 02/12/19 0726  WBC 8.5 8.3 12.6* 11.5* 11.9*  HGB 8.7* 9.4* 9.2* 8.9* 8.7*  HCT 25.6* 28.7* 28.8* 28.5* 27.4*  PLT 307 437* 457* 534* 501*  MCV 94.8 97.0 101.8* 102.5* 101.1*  MCH 32.2 31.8 32.5 32.0 32.1  MCHC 34.0 32.8 31.9 31.2 31.8  RDW 13.2 13.5 14.0 14.6 15.0  LYMPHSABS  --  1.3  --   --   --   MONOABS  --  1.0  --   --   --   EOSABS  --  0.1  --   --   --   BASOSABS  --  0.0  --   --   --     Chemistries  Recent Labs  Lab 02/08/19 1147 02/08/19 1147 02/10/19 0231 02/11/19 0500 02/11/19 1039 02/12/19 0726 02/13/19 0237  NA 141  --  138  --  141 141 141  K 3.5  --  4.3  --  4.0 4.4 4.0  CL 103  --  103  --  102 102 104  CO2 26  --  25   --  29 30 27   GLUCOSE 127*  --  125*  --  131* 111* 103*  BUN 16  --  22  --  20 20 21   CREATININE 0.63   < > 0.73 0.64 0.66 0.58 0.63  CALCIUM 8.9  --  8.5*  --  8.8* 8.8* 8.9  AST  --   --  51*  --  59* 39  --   ALT  --   --  22  --  16 12  --   ALKPHOS  --   --  59  --  71 65  --   BILITOT  --   --  1.1  --  1.1 0.8  --    < > = values in this interval not displayed.   ------------------------------------------------------------------------------------------------------------------ No results for input(s): CHOL, HDL, LDLCALC, TRIG, CHOLHDL, LDLDIRECT in the last 72 hours.  No results found for: HGBA1C ------------------------------------------------------------------------------------------------------------------  No results for input(s): TSH, T4TOTAL, T3FREE, THYROIDAB in the last 72 hours.  Invalid input(s): FREET3 ------------------------------------------------------------------------------------------------------------------ No results for input(s): VITAMINB12, FOLATE, FERRITIN, TIBC, IRON, RETICCTPCT in the last 72 hours.  Coagulation profile No results for input(s): INR, PROTIME in the last 168 hours.  Recent Labs    02/12/19 0706 02/13/19 0237  DDIMER 5.60* 6.69*    Cardiac Enzymes No results for input(s): CKMB, TROPONINI, MYOGLOBIN in the last 168 hours.  Invalid input(s): CK ------------------------------------------------------------------------------------------------------------------ No results found for: BNP  Micro Results Recent Results (from the past 240 hour(s))  Respiratory Panel by RT PCR (Flu A&B, Covid) - Nasopharyngeal Swab     Status: Abnormal   Collection Time: 02/03/19  7:19 PM   Specimen: Nasopharyngeal Swab  Result Value Ref Range Status   SARS Coronavirus 2 by RT PCR POSITIVE (A) NEGATIVE Final    Comment: RESULT CALLED TO, READ BACK BY AND VERIFIED WITH: T WALKER,RN @2058  02/03/19 MKELLY (NOTE) SARS-CoV-2 target nucleic acids are  DETECTED. SARS-CoV-2 RNA is generally detectable in upper respiratory specimens  during the acute phase of infection. Positive results are indicative of the presence of the identified virus, but do not rule out bacterial infection or co-infection with other pathogens not detected by the test. Clinical correlation with patient history and other diagnostic information is necessary to determine patient infection status. The expected result is Negative. Fact Sheet for Patients:  PinkCheek.be Fact Sheet for Healthcare Providers: GravelBags.it This test is not yet approved or cleared by the [place] FDA and  has been authorized for detection and/or diagnosis of SARS-CoV-2 by FDA under an Emergency Use Authorization (EUA).  This EUA will remain in effect (meaning this test can be used) for  the duration of  the COVID-19 declaration under Section 564(b)(1) of the Act, 21 U.S.C. section 360bbb-3(b)(1), unless the authorization is terminated or revoked sooner.    Influenza A by PCR NEGATIVE NEGATIVE Final   Influenza B by PCR NEGATIVE NEGATIVE Final    Comment: (NOTE) The Xpert Xpress SARS-CoV-2/FLU/RSV assay is intended as an aid in  the diagnosis of influenza from Nasopharyngeal swab specimens and  should not be used as a sole basis for treatment. Nasal washings and  aspirates are unacceptable for Xpert Xpress SARS-CoV-2/FLU/RSV  testing. Fact Sheet for Patients: PinkCheek.be Fact Sheet for Healthcare Providers: GravelBags.it This test is not yet approved or cleared by the [place] FDA and  has been authorized for detection and/or diagnosis of SARS-CoV-2 by  FDA under an Emergency Use Authorization (EUA). This EUA will remain  in effect (meaning this test can be used) for the duration of the  Covid-19 declaration under Section 564(b)(1) of the Act, 21  U.S.C.  section 360bbb-3(b)(1), unless the authorization is  terminated or revoked. Performed at [facility], [address], [place], [place] [address]   Surgical PCR screen     Status: None   Collection Time: 02/04/19  1:12 PM   Specimen: Nasal Mucosa; Nasal Swab  Result Value Ref Range Status   MRSA, PCR NEGATIVE NEGATIVE Final   Staphylococcus aureus NEGATIVE NEGATIVE Final    Comment: (NOTE) The Xpert SA Assay (FDA approved for NASAL specimens in patients 36 years of age and older), is one component of a comprehensive surveillance program. It is not intended to diagnose infection nor to guide or monitor treatment. Performed at [facility], [place] [address] [place],  [address]     Radiology Reports DG Chest 2 View  Result Date: 02/03/2019 CLINICAL DATA:  Fall, confusion at baseline EXAM: CHEST - 2 VIEW COMPARISON:  In a 01/23/2012, 01/27/2008 FINDINGS: Normal heart size, mediastinal contours, and pulmonary vascularity. Atherosclerotic calcification aorta. Emphysematous and bronchitic changes consistent with COPD. Interstitial prominence, chronic. No infiltrate, pleural effusion or pneumothorax. Bones demineralized with mark compression deformity of a lower thoracic vertebra, age indeterminate but new since 2010. IMPRESSION: COPD changes without acute infiltrate. Age-indeterminate compression fracture of a lower thoracic vertebra. Electronically Signed   By: Lavonia Dana M.D.   On: 02/03/2019 10:59   DG Pelvis 1-2 Views  Result Date: 02/03/2019 CLINICAL DATA:  Status post fall. EXAM: PELVIS - 1-2 VIEW COMPARISON:  None. FINDINGS: An acute fracture deformity is seen involving the proximal right femoral shaft. This is just below the intertrochanteric region. Lateral angulation of the fracture site is seen. Mild to moderate severity degenerative changes seen involving the bilateral hips. IMPRESSION: Acute fracture of the proximal right femur. Electronically Signed   By: Virgina Norfolk M.D.   On: 02/03/2019 19:11   DG Pelvis 1-2 Views  Result Date: 02/03/2019 CLINICAL DATA:  Fall, baseline confusion, pain in pelvis when moving for imaging EXAM: PELVIS - 1-2 VIEW COMPARISON:  None FINDINGS: Osseous demineralization. RIGHT SI joint poorly profiled. At LEFT SI joint and hip joint spaces preserved. No definite fracture, dislocation, or bone destruction. IMPRESSION: No definite acute osseous abnormalities. Electronically Signed   By: Lavonia Dana M.D.   On: 02/03/2019 11:00   DG Elbow 2 Views Right  Result Date: 02/09/2019 CLINICAL DATA:  Postoperative evaluation. EXAM: RIGHT ELBOW - 2 VIEW COMPARISON:  February 03, 2019 FINDINGS: It should be noted that the right elbow was imaged in a plaster cast with subsequently obscured osseous and soft tissue detail. A large radiopaque fixation screw is seen within the proximal right ulna. An acute fracture is not identified. There is no evidence of arthropathy or other focal bone abnormality. Soft tissues are unremarkable. IMPRESSION: Prior open reduction internal fixation of the proximal right ulna. Electronically Signed   By: Virgina Norfolk M.D.   On: 02/09/2019 18:58   DG Elbow Complete Right  Result Date: 02/03/2019 CLINICAL DATA:  Unwitnessed fall, swelling and bruising RIGHT elbow EXAM: RIGHT ELBOW - COMPLETE 3+ VIEW COMPARISON:  None FINDINGS: Osseous demineralization. Soft tissue swelling overlying the elbow and proximal forearm. Intra-articular olecranon fracture, proximal fragment distracted 2.3 cm. Humerus and radius intact. IMPRESSION: Distracted intra-articular olecranon fracture with significant overlying soft tissue swelling. Electronically Signed   By: Lavonia Dana M.D.   On: 02/03/2019 10:57   CT Head Wo Contrast  Result Date: 02/03/2019 CLINICAL DATA:  Trauma to the head and neck. EXAM: CT HEAD WITHOUT CONTRAST CT CERVICAL SPINE WITHOUT CONTRAST TECHNIQUE: Multidetector CT imaging of the head and cervical spine was  performed following the standard protocol without intravenous contrast. Multiplanar CT image reconstructions of the cervical spine were also generated. COMPARISON:  01/27/2012 FINDINGS: CT HEAD FINDINGS Brain: The brain does not show accelerated generalized atrophy. No focal finding affects the brainstem or cerebellum. 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 Mild chronic small-vessel change of the hemispheric white matter. No hydrocephalus, hemorrhage or extra-axial collection. Vascular: There is atherosclerotic calcification of the major vessels at the base of the brain. Skull: Left pterional craniotomy for previous aneurysm clipping. Otherwise negative. Sinuses/Orbits: Clear/normal Other: None CT CERVICAL SPINE FINDINGS Alignment: Normal except for 2  mm of degenerative anterolisthesis at C4-5. Skull base and vertebrae: No skull base fracture. No cervical spine fracture. Compression fracture at the superior endplate of T1 with loss of height of 10% or less. This is age indeterminate but could be recent. Soft tissues and spinal canal: Negative Disc levels: Chronic spondylosis at C5-6. Mild bilateral bony foraminal narrowing. Chronic facet osteoarthritis on the left at C4-5. Left bony foraminal encroachment because of this. Upper chest: Negative Other: None IMPRESSION: Head CT: No acute or traumatic finding. Previous left pterional craniotomy for aneurysm clipping. Cervical spine CT: No cervical spine fracture. Minimal superior endplate compression fracture at T1 with loss of height of 10%. This is age indeterminate but could possibly be recent. Degenerative spondylosis at C5-6. Degenerative facet arthritis on the left at C4-5. Electronically Signed   By: Nelson Chimes M.D.   On: 02/03/2019 11:13   CT Cervical Spine Wo Contrast  Result Date: 02/03/2019 CLINICAL DATA:  Trauma to the head and neck. EXAM: CT HEAD WITHOUT CONTRAST CT  CERVICAL SPINE WITHOUT CONTRAST TECHNIQUE: Multidetector CT imaging of the head and cervical spine was performed following the standard protocol without intravenous contrast. Multiplanar CT image reconstructions of the cervical spine were also generated. COMPARISON:  01/27/2012 FINDINGS: CT HEAD FINDINGS Brain: The brain does not show accelerated generalized atrophy. No focal finding affects the brainstem or cerebellum. 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 Mild chronic small-vessel change of the hemispheric white matter. No hydrocephalus, hemorrhage or extra-axial collection. Vascular: There is atherosclerotic calcification of the major vessels at the base of the brain. Skull: Left pterional craniotomy for previous aneurysm clipping. Otherwise negative. Sinuses/Orbits: Clear/normal Other: None CT CERVICAL SPINE FINDINGS Alignment: Normal except for 2 mm of degenerative anterolisthesis at C4-5. Skull base and vertebrae: No skull base fracture. No cervical spine fracture. Compression fracture at the superior endplate of T1 with loss of height of 10% or less. This is age indeterminate but could be recent. Soft tissues and spinal canal: Negative Disc levels: Chronic spondylosis at C5-6. Mild bilateral bony foraminal narrowing. Chronic facet osteoarthritis on the left at C4-5. Left bony foraminal encroachment because of this. Upper chest: Negative Other: None IMPRESSION: Head CT: No acute or traumatic finding. Previous left pterional craniotomy for aneurysm clipping. Cervical spine CT: No cervical spine fracture. Minimal superior endplate compression fracture at T1 with loss of height of 10%. This is age indeterminate but could possibly be recent. Degenerative spondylosis at C5-6. Degenerative facet arthritis on the left at C4-5. Electronically Signed   By: Nelson Chimes M.D.   On: 02/03/2019 11:13   CT Hip Right Wo Contrast  Result  Date: 02/03/2019 CLINICAL DATA:  Hip fracture EXAM: CT OF THE RIGHT HIP WITHOUT CONTRAST TECHNIQUE: Multidetector CT imaging of the right hip was performed according to the standard protocol. Multiplanar CT image reconstructions were also generated. COMPARISON:  Femur fracture 02/03/2019 FINDINGS: Bones/Joint/Cartilage The osseous structures appear diffusely demineralized which may limit detection of small or nondisplaced fractures. Comminuted extracapsular right femur fracture with predominantly intertrochanteric extension but with complete separation of the greater and lesser trochanters from both the femoral neck and the subtrochanteric region (AO/OTA A3.3). This is an unstable femur fracture. Hyperdense hemorrhage is noted within the bony trabecula a about the fracture site. The femoral head remains normally located. There are chronic degenerative changes of the acetabuli. Periacetabular spurring and os acetabuli likely reflects sequela of prior glenoid  degenerative change. Remaining imaged portions of the pelvic bones are intact. Ligaments Suboptimally assessed by CT. Muscles and Tendons There is soft tissue swelling and edematous changes throughout the musculature of the proximal thigh. No large joint effusion is seen likely as this fracture is extracapsular in nature. Soft tissues Extensive right hip soft tissue swelling and edematous changes. Hemorrhage about the fracture line within the bony trabecula, as detailed above. Included portions of the pelvis demonstrates some atherosclerosis and un inflamed colonic diverticula but are otherwise unremarkable. IMPRESSION: Unstable comminuted extracapsular right femur fracture with predominantly intertrochanteric extension but with complete separation of the greater and lesser trochanters from both the femoral neck and the subtrochanteric region (AO/OTA A3.3). Hyperdense hemorrhage is noted within the bony trabecula about the fracture site. No large joint effusion  likely as this fracture is extracapsular in nature. Associated right hip soft tissue swelling and edema. More chronic degenerative changes the right Electronically Signed   By: Lovena Le M.D.   On: 02/03/2019 22:47   CT ELBOW RIGHT WO CONTRAST  Result Date: 02/07/2019 CLINICAL DATA:  Right elbow pain status post fall, preop 02/09/2019. EXAM: CT OF THE UPPER RIGHT EXTREMITY WITHOUT CONTRAST TECHNIQUE: Multidetector CT imaging of the upper right extremity was performed according to the standard protocol. COMPARISON:  None. FINDINGS: Bones/Joint/Cartilage Limited evaluation secondary to positioning. Comminuted fracture of the olecranon with 2 cm of distraction. Mild cortical irregularity along the posterior aspect of the capitellum concerning for small marginal osteophyte versus a subtle nondisplaced fracture (image 31/series 4). Large joint effusion. No other acute fracture or dislocation. No aggressive osseous lesion. Ligaments Suboptimally assessed by CT. Muscles and Tendons Muscles are normal. No muscle atrophy. Biceps tendon is intact. Triceps tendon is intact. Soft tissues No fluid collection or hematoma. IMPRESSION: Comminuted fracture of the olecranon with 2 cm of distraction. Mild cortical irregularity along the posterior aspect of the capitellum concerning for small marginal osteophyte versus a subtle nondisplaced fracture (image 31/series 4). Electronically Signed   By: Kathreen Devoid   On: 02/07/2019 16:39   DG CHEST PORT 1 VIEW  Result Date: 02/04/2019 CLINICAL DATA:  Oxygen desaturation. EXAM: PORTABLE CHEST 1 VIEW COMPARISON:  February 03, 2019 FINDINGS: The heart, hila, and mediastinum are normal. No pneumothorax. No nodules or masses. No focal infiltrates. IMPRESSION: No active disease. Electronically Signed   By: Dorise Bullion III M.D   On: 02/04/2019 19:52   DG C-Arm 1-60 Min  Result Date: 02/04/2019 CLINICAL DATA:  Femur fracture fixation EXAM: DG C-ARM 1-60 MIN; RIGHT FEMUR 2 VIEWS  FLUOROSCOPY TIME:  Fluoroscopy Time:  1 minutes 50 seconds Number of Acquired Spot Images: 4 COMPARISON:  None. FINDINGS: Intraoperative radiographs demonstrate trochanteric femoral nail and intramedullary rod fixation proximal right femur shaft fracture. There are 2 distal interlocking screws. Alignment appears improved. IMPRESSION: Fluoroscopic guidance for fixation of right femur fracture with improved alignment Electronically Signed   By: Macy Mis M.D.   On: 02/04/2019 16:51   DG HIP PORT UNILAT WITH PELVIS 1V RIGHT  Result Date: 02/11/2019 CLINICAL DATA:  Postoperative evaluation. EXAM: DG HIP (WITH OR WITHOUT PELVIS) 1V PORT RIGHT COMPARISON:  02/04/2019. FINDINGS: Patient status post ORIF right femur. Hardware intact and in stable position. Stable alignment. Diffuse osteopenia. Degenerative changes both hips. Pelvic calcifications consistent phleboliths. IMPRESSION: ORIF right femur.  Hardware intact.  Stable alignment. Electronically Signed   By: [place]   On: 02/11/2019 08:44   DG FEMUR, MIN 2 VIEWS RIGHT  Result  Date: 02/04/2019 CLINICAL DATA:  Femur fracture fixation EXAM: DG C-ARM 1-60 MIN; RIGHT FEMUR 2 VIEWS FLUOROSCOPY TIME:  Fluoroscopy Time:  1 minutes 50 seconds Number of Acquired Spot Images: 4 COMPARISON:  None. FINDINGS: Intraoperative radiographs demonstrate trochanteric femoral nail and intramedullary rod fixation proximal right femur shaft fracture. There are 2 distal interlocking screws. Alignment appears improved. IMPRESSION: Fluoroscopic guidance for fixation of right femur fracture with improved alignment Electronically Signed   By: Macy Mis M.D.   On: 02/04/2019 16:51   DG FEMUR PORT, MIN 2 VIEWS RIGHT  Result Date: 02/04/2019 CLINICAL DATA:  Postoperative evaluation. EXAM: RIGHT FEMUR PORTABLE 2 VIEW COMPARISON:  None. FINDINGS: A radiopaque intramedullary rod is seen along the length of the right femur. A radiopaque compression screw device is noted  within the right femoral head and neck. An inter trochanteric fracture of the proximal right femur is seen with gross anatomic alignment. Soft tissues are unremarkable. IMPRESSION: Status post open reduction and internal fixation of the proximal right femur. Electronically Signed   By: Virgina Norfolk M.D.   On: 02/04/2019 18:07   DG Femur Portable Min 2 Views Right  Result Date: 02/03/2019 CLINICAL DATA:  Status post fall. EXAM: RIGHT FEMUR PORTABLE 2 VIEW COMPARISON:  None. FINDINGS: Acute fracture deformity is seen involving shaft of the proximal right femur. This extends to the level just below the intertrochanteric region. Superolateral angulation of the fracture site is seen. There is no evidence of dislocation. IMPRESSION: Acute fracture of the proximal right femur. Electronically Signed   By: Virgina Norfolk M.D.   On: 02/03/2019 19:12   VAS [place] LOWER EXTREMITY VENOUS (DVT)  Result Date: 02/13/2019  Lower Venous DVTStudy Indications: Edema, and Covid+, Elevated D dimer.  Limitations: Patient is contracted, right hip fracture, and uncooperative. Comparison Study: No prior exam. Performing Technologist: Baldwin Crown RDMS, RVT  Examination Guidelines: A complete evaluation includes B-mode imaging, spectral Doppler, color Doppler, and power Doppler as needed of all accessible portions of each vessel. Bilateral testing is considered an integral part of a complete examination. Limited examinations for reoccurring indications may be performed as noted. The reflux portion of the exam is performed with the patient in reverse Trendelenburg.  +---------+---------------+---------+-----------+----------+--------------+ RIGHT    CompressibilityPhasicitySpontaneityPropertiesThrombus Aging +---------+---------------+---------+-----------+----------+--------------+ CFV      Full           Yes      Yes                                  +---------+---------------+---------+-----------+----------+--------------+ SFJ      Full                                                        +---------+---------------+---------+-----------+----------+--------------+ FV Prox  Full                                                        +---------+---------------+---------+-----------+----------+--------------+ FV Mid   Full                                                        +---------+---------------+---------+-----------+----------+--------------+  FV DistalFull                                                        +---------+---------------+---------+-----------+----------+--------------+ PFV      Full                                                        +---------+---------------+---------+-----------+----------+--------------+ POP      Full           Yes      Yes                                 +---------+---------------+---------+-----------+----------+--------------+ PTV      Full                                                        +---------+---------------+---------+-----------+----------+--------------+ PERO                                                  Not visualized +---------+---------------+---------+-----------+----------+--------------+   +---------+---------------+---------+-----------+----------+--------------+ LEFT     CompressibilityPhasicitySpontaneityPropertiesThrombus Aging +---------+---------------+---------+-----------+----------+--------------+ CFV      Full           Yes      Yes                                 +---------+---------------+---------+-----------+----------+--------------+ SFJ      Full                                                        +---------+---------------+---------+-----------+----------+--------------+ FV Prox  Full                                                         +---------+---------------+---------+-----------+----------+--------------+ FV Mid   Full                                                        +---------+---------------+---------+-----------+----------+--------------+ FV DistalFull                                                        +---------+---------------+---------+-----------+----------+--------------+  PFV      Full                                                        +---------+---------------+---------+-----------+----------+--------------+ POP      Full                                                        +---------+---------------+---------+-----------+----------+--------------+ PTV      Full                                                        +---------+---------------+---------+-----------+----------+--------------+ PERO                                                  Not visualized +---------+---------------+---------+-----------+----------+--------------+ Poorly visuaslized calf veins due to patient contracted and uncooperative.    Summary: BILATERAL: - No evidence of deep vein thrombosis seen in the lower extremities, bilaterally.  RIGHT: - No cystic structure found in the popliteal fossa.  LEFT: - No cystic structure found in the popliteal fossa.  *See table(s) above for measurements and observations. Electronically signed by Ruta Hinds MD on 02/13/2019 at 11:36:20 AM.    Final

## 2019-02-14 DIAGNOSIS — L89612 Pressure ulcer of right heel, stage 2: Secondary | ICD-10-CM | POA: Diagnosis present

## 2019-02-14 DIAGNOSIS — Z66 Do not resuscitate: Secondary | ICD-10-CM | POA: Diagnosis present

## 2019-02-14 DIAGNOSIS — S065X0A Traumatic subdural hemorrhage without loss of consciousness, initial encounter: Secondary | ICD-10-CM | POA: Diagnosis not present

## 2019-02-14 DIAGNOSIS — F0390 Unspecified dementia without behavioral disturbance: Secondary | ICD-10-CM | POA: Diagnosis present

## 2019-02-14 DIAGNOSIS — Z743 Need for continuous supervision: Secondary | ICD-10-CM | POA: Diagnosis not present

## 2019-02-14 DIAGNOSIS — S199XXA Unspecified injury of neck, initial encounter: Secondary | ICD-10-CM | POA: Diagnosis not present

## 2019-02-14 DIAGNOSIS — R269 Unspecified abnormalities of gait and mobility: Secondary | ICD-10-CM | POA: Diagnosis not present

## 2019-02-14 DIAGNOSIS — Z87442 Personal history of urinary calculi: Secondary | ICD-10-CM | POA: Diagnosis not present

## 2019-02-14 DIAGNOSIS — S72361A Displaced segmental fracture of shaft of right femur, initial encounter for closed fracture: Secondary | ICD-10-CM | POA: Diagnosis not present

## 2019-02-14 DIAGNOSIS — S32591A Other specified fracture of right pubis, initial encounter for closed fracture: Secondary | ICD-10-CM | POA: Diagnosis present

## 2019-02-14 DIAGNOSIS — Z8679 Personal history of other diseases of the circulatory system: Secondary | ICD-10-CM | POA: Diagnosis not present

## 2019-02-14 DIAGNOSIS — U071 COVID-19: Secondary | ICD-10-CM | POA: Diagnosis not present

## 2019-02-14 DIAGNOSIS — S52021D Displaced fracture of olecranon process without intraarticular extension of right ulna, subsequent encounter for closed fracture with routine healing: Secondary | ICD-10-CM | POA: Diagnosis not present

## 2019-02-14 DIAGNOSIS — S72001A Fracture of unspecified part of neck of right femur, initial encounter for closed fracture: Secondary | ICD-10-CM | POA: Diagnosis not present

## 2019-02-14 DIAGNOSIS — S32501A Unspecified fracture of right pubis, initial encounter for closed fracture: Secondary | ICD-10-CM | POA: Diagnosis not present

## 2019-02-14 DIAGNOSIS — D72829 Elevated white blood cell count, unspecified: Secondary | ICD-10-CM | POA: Diagnosis not present

## 2019-02-14 DIAGNOSIS — S0281XA Fracture of other specified skull and facial bones, right side, initial encounter for closed fracture: Secondary | ICD-10-CM | POA: Diagnosis not present

## 2019-02-14 DIAGNOSIS — N390 Urinary tract infection, site not specified: Secondary | ICD-10-CM | POA: Diagnosis not present

## 2019-02-14 DIAGNOSIS — J439 Emphysema, unspecified: Secondary | ICD-10-CM | POA: Diagnosis present

## 2019-02-14 DIAGNOSIS — R339 Retention of urine, unspecified: Secondary | ICD-10-CM | POA: Diagnosis not present

## 2019-02-14 DIAGNOSIS — J189 Pneumonia, unspecified organism: Secondary | ICD-10-CM | POA: Diagnosis present

## 2019-02-14 DIAGNOSIS — S02841A Fracture of lateral orbital wall, right side, initial encounter for closed fracture: Secondary | ICD-10-CM | POA: Diagnosis present

## 2019-02-14 DIAGNOSIS — R410 Disorientation, unspecified: Secondary | ICD-10-CM

## 2019-02-14 DIAGNOSIS — E7800 Pure hypercholesterolemia, unspecified: Secondary | ICD-10-CM | POA: Diagnosis present

## 2019-02-14 DIAGNOSIS — K5900 Constipation, unspecified: Secondary | ICD-10-CM | POA: Diagnosis not present

## 2019-02-14 DIAGNOSIS — E785 Hyperlipidemia, unspecified: Secondary | ICD-10-CM | POA: Diagnosis present

## 2019-02-14 DIAGNOSIS — Z515 Encounter for palliative care: Secondary | ICD-10-CM | POA: Diagnosis not present

## 2019-02-14 DIAGNOSIS — S32810A Multiple fractures of pelvis with stable disruption of pelvic ring, initial encounter for closed fracture: Secondary | ICD-10-CM | POA: Diagnosis not present

## 2019-02-14 DIAGNOSIS — S065X9A Traumatic subdural hemorrhage with loss of consciousness of unspecified duration, initial encounter: Secondary | ICD-10-CM | POA: Diagnosis present

## 2019-02-14 DIAGNOSIS — D649 Anemia, unspecified: Secondary | ICD-10-CM | POA: Diagnosis not present

## 2019-02-14 DIAGNOSIS — E46 Unspecified protein-calorie malnutrition: Secondary | ICD-10-CM | POA: Diagnosis present

## 2019-02-14 DIAGNOSIS — Z9181 History of falling: Secondary | ICD-10-CM | POA: Diagnosis not present

## 2019-02-14 DIAGNOSIS — W19XXXA Unspecified fall, initial encounter: Secondary | ICD-10-CM | POA: Diagnosis not present

## 2019-02-14 DIAGNOSIS — S32401A Unspecified fracture of right acetabulum, initial encounter for closed fracture: Secondary | ICD-10-CM | POA: Diagnosis present

## 2019-02-14 DIAGNOSIS — T1490XA Injury, unspecified, initial encounter: Secondary | ICD-10-CM | POA: Diagnosis not present

## 2019-02-14 DIAGNOSIS — R296 Repeated falls: Secondary | ICD-10-CM | POA: Diagnosis present

## 2019-02-14 DIAGNOSIS — S72361D Displaced segmental fracture of shaft of right femur, subsequent encounter for closed fracture with routine healing: Secondary | ICD-10-CM | POA: Diagnosis not present

## 2019-02-14 DIAGNOSIS — S0240EA Zygomatic fracture, right side, initial encounter for closed fracture: Secondary | ICD-10-CM | POA: Diagnosis present

## 2019-02-14 DIAGNOSIS — S0990XA Unspecified injury of head, initial encounter: Secondary | ICD-10-CM | POA: Diagnosis not present

## 2019-02-14 DIAGNOSIS — Z20822 Contact with and (suspected) exposure to covid-19: Secondary | ICD-10-CM | POA: Diagnosis present

## 2019-02-14 DIAGNOSIS — R2689 Other abnormalities of gait and mobility: Secondary | ICD-10-CM | POA: Diagnosis not present

## 2019-02-14 DIAGNOSIS — R404 Transient alteration of awareness: Secondary | ICD-10-CM | POA: Diagnosis not present

## 2019-02-14 DIAGNOSIS — R627 Adult failure to thrive: Secondary | ICD-10-CM | POA: Diagnosis present

## 2019-02-14 DIAGNOSIS — S72141D Displaced intertrochanteric fracture of right femur, subsequent encounter for closed fracture with routine healing: Secondary | ICD-10-CM | POA: Diagnosis not present

## 2019-02-14 DIAGNOSIS — Y92129 Unspecified place in nursing home as the place of occurrence of the external cause: Secondary | ICD-10-CM | POA: Diagnosis not present

## 2019-02-14 DIAGNOSIS — S0181XA Laceration without foreign body of other part of head, initial encounter: Secondary | ICD-10-CM | POA: Diagnosis present

## 2019-02-14 DIAGNOSIS — S0231XA Fracture of orbital floor, right side, initial encounter for closed fracture: Secondary | ICD-10-CM | POA: Diagnosis present

## 2019-02-14 DIAGNOSIS — S0292XA Unspecified fracture of facial bones, initial encounter for closed fracture: Secondary | ICD-10-CM | POA: Diagnosis not present

## 2019-02-14 DIAGNOSIS — S32421A Displaced fracture of posterior wall of right acetabulum, initial encounter for closed fracture: Secondary | ICD-10-CM | POA: Diagnosis not present

## 2019-02-14 DIAGNOSIS — R279 Unspecified lack of coordination: Secondary | ICD-10-CM | POA: Diagnosis not present

## 2019-02-14 DIAGNOSIS — S79929A Unspecified injury of unspecified thigh, initial encounter: Secondary | ICD-10-CM | POA: Diagnosis not present

## 2019-02-14 DIAGNOSIS — S299XXA Unspecified injury of thorax, initial encounter: Secondary | ICD-10-CM | POA: Diagnosis not present

## 2019-02-14 DIAGNOSIS — S32511A Fracture of superior rim of right pubis, initial encounter for closed fracture: Secondary | ICD-10-CM | POA: Diagnosis not present

## 2019-02-14 DIAGNOSIS — S32409A Unspecified fracture of unspecified acetabulum, initial encounter for closed fracture: Secondary | ICD-10-CM | POA: Diagnosis not present

## 2019-02-14 DIAGNOSIS — Z87891 Personal history of nicotine dependence: Secondary | ICD-10-CM | POA: Diagnosis not present

## 2019-02-14 DIAGNOSIS — S32471A Displaced fracture of medial wall of right acetabulum, initial encounter for closed fracture: Secondary | ICD-10-CM | POA: Diagnosis not present

## 2019-02-14 DIAGNOSIS — R52 Pain, unspecified: Secondary | ICD-10-CM | POA: Diagnosis not present

## 2019-02-14 DIAGNOSIS — R64 Cachexia: Secondary | ICD-10-CM | POA: Diagnosis present

## 2019-02-14 DIAGNOSIS — S3992XA Unspecified injury of lower back, initial encounter: Secondary | ICD-10-CM | POA: Diagnosis not present

## 2019-02-14 DIAGNOSIS — M545 Low back pain: Secondary | ICD-10-CM | POA: Diagnosis not present

## 2019-02-14 DIAGNOSIS — S52024D Nondisplaced fracture of olecranon process without intraarticular extension of right ulna, subsequent encounter for closed fracture with routine healing: Secondary | ICD-10-CM | POA: Diagnosis not present

## 2019-02-14 DIAGNOSIS — W07XXXA Fall from chair, initial encounter: Secondary | ICD-10-CM | POA: Diagnosis present

## 2019-02-14 MED ORDER — POLYETHYLENE GLYCOL 3350 17 G PO PACK: 17.0000 g | PACK | Freq: Every day | ORAL | 0 refills | Status: DC | PRN

## 2019-02-14 MED ORDER — TAMSULOSIN HCL 0.4 MG PO CAPS: 0.4000 mg | ORAL_CAPSULE | Freq: Every day | ORAL | Status: DC

## 2019-02-14 MED ORDER — SENNOSIDES-DOCUSATE SODIUM 8.6-50 MG PO TABS: 2.0000 | ORAL_TABLET | Freq: Two times a day (BID) | ORAL | Status: DC

## 2019-02-14 MED ORDER — FERROUS SULFATE 325 (65 FE) MG PO TABS: 325.0000 mg | ORAL_TABLET | Freq: Two times a day (BID) | ORAL | 3 refills | Status: DC

## 2019-02-14 MED ORDER — VITAMIN D3 25 MCG PO TABS: 1000.0000 [IU] | ORAL_TABLET | Freq: Every day | ORAL | Status: DC

## 2019-02-14 MED ORDER — ENSURE ENLIVE PO LIQD: 237.0000 mL | Freq: Three times a day (TID) | ORAL | 12 refills | Status: DC

## 2019-02-14 MED ORDER — ACETAMINOPHEN 325 MG PO TABS: 650.0000 mg | ORAL_TABLET | Freq: Four times a day (QID) | ORAL | Status: DC | PRN

## 2019-02-14 NOTE — TOC Progression Note (Signed)
Transition of Care [facility]) - Progression Note    Patient Details  Name: [name] MRN: [id number] Date of Birth: October 09, 1934  Transition of Care [facility]) CM/SW [place], LCSW Phone Number: 02/14/2019, 8:47 AM  Clinical Narrative:    CSW left message for [facility] admissions to determine bed availability.    Expected Discharge Plan: [place] Barriers to Discharge: Continued Medical Work up  Expected Discharge Plan and Services Expected Discharge Plan: [place] arrangements for the past 2 months: Single Family Home                                       Social Determinants of Health (SDOH) Interventions    Readmission Risk Interventions No flowsheet data found.

## 2019-02-14 NOTE — Care Management Important Message (Signed)
Important Message  Patient Details  Name: [name] MRN: [id number] Date of Birth: 02/21/1934   Medicare Important Message Given:  Yes - Important Message mailed due to current National Emergency  Verbal consent obtained due to current National Emergency  Relationship to patient: Child Contact Name: Harlen Labs Call Date: 02/14/19  Time: 1252 Phone: [phone] Outcome: Spoke with contact Important Message mailed to: Emergency contact on file    [name] 02/14/2019, 12:52 PM

## 2019-02-14 NOTE — Progress Notes (Signed)
[name] (daughter) called and updated on patient's night. Unremarkable overnight.  All concerns and questions addressed.

## 2019-02-14 NOTE — TOC Transition Note (Addendum)
Transition of Care [facility]) - CM/SW Discharge Note   Patient Details  Name: SOLIMAR RETZER MRN: [id number] Date of Birth: February 18, 1934  [facility]) CM/SW Contact:  Benard Halsted, LCSW Phone Number: 02/14/2019, 3:01 PM   Clinical Narrative:    Patient will DC to: [place] Anticipated DC date: 02/14/19 Family notified: Daughter, [name] Transport by: [facility]   Please make sure patient's bottom partial goes with her.   Per MD patient ready for DC to [place], patient, patient's family, and facility notified of DC. Discharge Summary and FL2 sent to facility. RN to call report prior to discharge [phone]). DC packet on chart. Ambulance transport requested for patient.   CSW will sign off for now as social work intervention is no longer needed. Please consult [place] again if new needs arise.  Cedric Fishman, LCSW Clinical Social Worker [phone]    Final next level of care: Skilled Nursing Facility Barriers to Discharge: No Barriers Identified   Patient Goals and CMS Choice Patient states their goals for this hospitalization and ongoing recovery are:: Rehab CMS Medicare.gov Compare Post Acute Care list provided to:: Patient Represenative (must comment)(Vicki, daughter) Choice offered to / list presented to : Adult Children  Discharge Placement   Existing PASRR number confirmed : 02/14/19          Patient chooses bed at: [facility] Patient to be transferred to facility by: [place] Name of family member notified: Daughter, Vickie Patient and family notified of of transfer: 02/14/19  Discharge Plan and Services                                     Social Determinants of Health (SDOH) Interventions     Readmission Risk Interventions No flowsheet data found.

## 2019-02-14 NOTE — Progress Notes (Signed)
Physical Therapy Treatment Patient Details Name: [name] MRN: [id number] DOB: 1934-02-08 Today's Date: 02/14/2019    History of Present Illness Pt adm after fall with displaced segmental fracture of the rt femur. Pt found to be Covid+. Pt had fall earlier in the day suffering a displaced olecranon fx on the rt and was splinted and dc'd home from the ED before suffering second fall. Pt underwent ORIF of rt femur on 1/29. Underwent R olecranon fx ORIF on 02/09/19. PMH - dementia, craniotomy for aneurysm clipping.    PT Comments    Pt admitted with above diagnosis. Pt was able to stand to [facility] with max assist of 2.  Pt confusion significantly limits pt progress.  Pt did some LE exercises but difficult to keep on task.  Changed frequency to 2x week as pt has made slow progress and this complies with standards for care for Acute Rehab.   Pt currently with functional limitations due to balance and endurance deficits. Pt will benefit from skilled PT to increase their independence and safety with mobility to allow discharge to the venue listed below.     Follow Up Recommendations  SNF;Supervision/Assistance - 24 hour     Equipment Recommendations  Other (comment)(To be determined)    Recommendations for Other Services       Precautions / Restrictions Precautions Precautions: Fall;Other (comment) Precaution Comments: NWB RUE, NO ROM, splint. WBAT RLE Required Braces or Orthoses: Splint/Cast Splint/Cast: RUE Restrictions Weight Bearing Restrictions: Yes RUE Weight Bearing: Non weight bearing RLE Weight Bearing: Weight bearing as tolerated Other Position/Activity Restrictions: NO ROM to RUE    Mobility  Bed Mobility Overal bed mobility: Needs Assistance Bed Mobility: Supine to Sit;Sit to Supine Rolling: Max assist;+2 for physical assistance;+2 for safety/equipment;Total assist   Supine to sit: Max assist Sit to supine: Total assist   General bed mobility comments: Max A to  bring BLEs towards EOB and then elevate trunk.   Transfers Overall transfer level: Needs assistance Equipment used: 2 person hand held assist Transfers: Sit to/from [facility] Sit to Stand: +2 physical assistance;From elevated surface;Max assist Stand pivot transfers: Total assist;+2 safety/equipment       General transfer comment: Used [place] and pt was able to stand with max assist of 2 to the [facility] and buttock pads placed.  then moved her to recliner in [place]. Stood again with max assist of 2.  Pt confusion limits progress significantly.   Ambulation/Gait                 Stairs             Wheelchair Mobility    Modified Rankin (Stroke Patients Only)       Balance Overall balance assessment: Needs assistance;History of Falls Sitting-balance support: Feet supported;Single extremity supported Sitting balance-Leahy Scale: Fair Sitting balance - Comments: minguard-minA for static sitting balance sitting 10 min and did some LE exercise. Tends to lean right, requires max multimodal cues to correct and only able to maintain midline for short periods of time  Postural control: Right lateral lean;Posterior lean Standing balance support: Single extremity supported;During functional activity Standing balance-Leahy Scale: Zero Standing balance comment: Needs max to total assist to come to partial stand.                             Cognition Arousal/Alertness: Awake/alert Behavior During Therapy: WFL for tasks assessed/performed Overall Cognitive Status: History of cognitive impairments -  at baseline                                 General Comments: Dementia. Very confused. Agitated with movement. Repeating that she needed to leave this place. Once transfered back into bed and turned TV on she was content again.      Exercises General Exercises - Lower Extremity Quad Sets: AROM;Both;5 reps;Supine Long Arc Quad: AAROM;Both;5  reps;Seated Hip ABduction/ADduction: AAROM;Both;5 reps;Supine    General Comments General comments (skin integrity, edema, etc.): VSS      Pertinent Vitals/Pain Pain Assessment: Faces Faces Pain Scale: Hurts whole lot Pain Location: R hip with certain movements Pain Descriptors / Indicators: Discomfort;Grimacing;Sore Pain Intervention(s): Limited activity within patient's tolerance;Monitored during session;Repositioned    Home Living                      Prior Function            PT Goals (current goals can now be found in the care plan section) Acute Rehab PT Goals Patient Stated Goal: Pt didn't state, agreeable to moving with therapies Progress towards PT goals: Progressing toward goals    Frequency    Min 2X/week      PT Plan Frequency needs to be updated    Co-evaluation              AM-PAC PT "6 Clicks" Mobility   Outcome Measure  Help needed turning from your back to your side while in a flat bed without using bedrails?: Total Help needed moving from lying on your back to sitting on the side of a flat bed without using bedrails?: A Lot Help needed moving to and from a bed to a chair (including a wheelchair)?: Total Help needed standing up from a chair using your arms (e.g., wheelchair or bedside chair)?: Total Help needed to walk in hospital room?: Total Help needed climbing 3-5 steps with a railing? : Total 6 Click Score: 7    End of Session Equipment Utilized During Treatment: Gait belt Activity Tolerance: Patient limited by pain;Patient limited by fatigue Patient left: with call bell/phone within reach;in chair;with chair alarm set Nurse Communication: Mobility status PT Visit Diagnosis: Other abnormalities of gait and mobility (R26.89);History of falling (Z91.81);Muscle weakness (generalized) (M62.81);Pain Pain - Right/Left: Right Pain - part of body: Hip;Leg     Time: 1100-1114 PT Time Calculation (min) (ACUTE ONLY): 14  min  Charges:  $Therapeutic Activity: 8-22 mins                     Truda Staub W,PT Acute Rehabilitation Services Pager:  [phone]  Office:  [place] 02/14/2019, 1:25 PM

## 2019-02-14 NOTE — Discharge Summary (Signed)
PATIENT DETAILS Name: [name] Age: 85 y.o. Sex: female Date of Birth: 19-Jan-1934 MRN: [id number]. Admitting Physician: Lynetta Mare, MD [id number], Carloyn Manner, MD  Admit Date: 02/03/2019 Discharge date: 02/14/2019  Recommendations for Outpatient Follow-up:  1. Follow up with PCP in 1-2 weeks 2. Please obtain CMP/CBC in one week 3. Repeat Chest Xray in 4-6 week 4. Please ensure follow-up with orthopedics. 5. Outpatient voiding trial to see if Foley can be discontinued-May need urology follow-up as well.  Admitted From:  Home  Disposition: SNF   Home Health: No  Equipment/Devices: None  Discharge Condition: Stable  CODE STATUS: DNR  Diet recommendation:  Diet Order            Diet - low sodium heart healthy        Diet regular Room service appropriate? No; Fluid consistency: Thin  Diet effective now               Brief Summary: See H&P, Labs, Consult and Test reports for all details in brief, Patient is a 85 y.o. 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  See below for further details.  Brief Hospital Course: COVID-19 infection-no respiratory symptoms-on room air this morning.  Appears to be relatively asymptomatic-chest x-ray on 1/29 did not show pneumonia.  CRP was elevated but now downtrending-could be from orthopedic issues rather than pneumonia.  COVID-19 Labs:  Recent Labs    02/12/19 0706 02/13/19 0237  DDIMER 5.60* 6.69*  CRP 4.0* 5.1*    Lab Results  Component Value Date   SARSCOV2NAA POSITIVE (A) 02/03/2019    Elevated D-dimer: Lower extremity Doppler negative-not hypoxic-suspect elevated D-dimer from surgical issues rather than COVID-19-continue prophylactic Lovenox.  Right proximal femur fracture: S/p right cephalomedullary  nail on 1/29.  Orthopedics following-recommendations are for WBAT RLE-Lovenox 40 mg daily x 6 weeks.  Comminuted fracture of the right olecranon: S/p ORIF on 2/3-orthopedics following.  NWB to RUE while in splint for 2 weeks, ROM to start gently after that.  Acute blood loss anemia from perioperative blood loss: Hemoglobin stable-s/p 1 unit of PRBC so far.  Follow.  Dementia: At risk for delirium-currently pleasantly confused-maintain delirium precautions.  Acute urinary retention: Foley catheter discontinued on 2/4-unsuccessful voiding trial-redeveloped urinary retention and Foley catheter was reinserted.Will need outpatient voiding trial-continue Flomax.  Failure to thrive syndrome: Advanced dementia-unfortunately continues to have very poor oral intake-which seems to be slowly improving-Per nursing staff-able to consume around 25% of her meals along with some amount of her nutritional supplements.  Nutrition Problem: Nutrition Problem: Increased nutrient needs Etiology: post-op healing Signs/Symptoms: estimated needs Interventions: MVI, Ensure Enlive (each supplement provides 350kcal and 20 grams of protein), Magic cup    Goals of care: DNR in place-Long discussion with patient's daughter-family is aware that patient now has severe failure to thrive syndrome-although has poor oral intake-she appears stable-and suspect that she is probably a better candidate for SNF rather than hospice at this point.  Nutrition Problem: Nutrition Problem: Increased nutrient needs Etiology: post-op healing Signs/Symptoms: estimated needs Interventions: MVI, Ensure Enlive (each supplement provides 350kcal and 20 grams of protein), Magic cup  Procedures/Studies: 1/29>> right cephalomedullary nail-right hip 2/3>> ORIF-right olecranon  Discharge Diagnoses:  Principal Problem:   Displaced segmental fracture of shaft of right femur, initial encounter for closed fracture ([place])  Active Problems:    Distal radius fracture, left   Dementia ([place])   Palliative care by specialist   Goals of care, counseling/discussion   Right hip pain   Muscular weakness   Delirium   Discharge Instructions:    Person Under Monitoring Name: [name]  Location: [address] [place] [address]   Infection Prevention Recommendations for Individuals Confirmed to have, or Being Evaluated for, 2019 Novel Coronavirus (COVID-19) Infection Who Receive Care at Home  Individuals who are confirmed to have, or are being evaluated for, COVID-19 should follow the prevention steps below until a healthcare provider or local or state health department says they can return to normal activities.  Stay home except to get medical care You should restrict activities outside your home, except for getting medical care. Do not go to work, school, or public areas, and do not use public transportation or taxis.  Call ahead before visiting your doctor Before your medical appointment, call the healthcare provider and tell them that you have, or are being evaluated for, COVID-19 infection. This will help the healthcare provider's office take steps to keep other people from getting infected. Ask your healthcare provider to call the local or state health department.  Monitor your symptoms Seek prompt medical attention if your illness is worsening (e.g., difficulty breathing). Before going to your medical appointment, call the healthcare provider and tell them that you have, or are being evaluated for, COVID-19 infection. Ask your healthcare provider to call the local or state health department.  Wear a facemask You should wear a facemask that covers your nose and mouth when you are in the same room with other people and when you visit a healthcare provider. People who live with or visit you should also wear a facemask while they are in the same room with you.  Separate yourself from other people in your  home As much as possible, you should stay in a different room from other people in your home. Also, you should use a separate bathroom, if available.  Avoid sharing household items You should not share dishes, drinking glasses, cups, eating utensils, towels, bedding, or other items with other people in your home. After using these items, you should wash them thoroughly with soap and water.  Cover your coughs and sneezes Cover your mouth and nose with a tissue when you cough or sneeze, or you can cough or sneeze into your sleeve. Throw used tissues in a lined trash can, and immediately wash your hands with soap and water for at least 20 seconds or use an alcohol-based hand rub.  Wash your [facility] your hands often and thoroughly with soap and water for at least 20 seconds. You can use an alcohol-based hand sanitizer if soap and water are not available and if your hands are not visibly dirty. Avoid touching your eyes, nose, and mouth with unwashed hands.   Prevention Steps for Caregivers and Household Members of Individuals Confirmed to have, or Being Evaluated for, COVID-19 Infection Being Cared for in the Home  If you live with, or provide care at home for, a person confirmed to have, or being evaluated for, COVID-19 infection please follow these guidelines to prevent infection:  Follow healthcare provider's instructions Make sure that you understand and can help the patient follow any healthcare provider instructions for all care.  Provide for the patient's basic needs You should help the patient with basic needs in the home and provide support for getting groceries,  prescriptions, and other personal needs.  Monitor the patient's symptoms If they are getting sicker, call his or her medical provider and tell them that the patient has, or is being evaluated for, COVID-19 infection. This will help the healthcare provider's office take steps to keep other people from getting  infected. Ask the healthcare provider to call the local or state health department.  Limit the number of people who have contact with the patient  If possible, have only one caregiver for the patient.  Other household members should stay in another home or place of residence. If this is not possible, they should stay  in another room, or be separated from the patient as much as possible. Use a separate bathroom, if available.  Restrict visitors who do not have an essential need to be in the home.  Keep older adults, very young children, and other sick people away from the patient Keep older adults, very young children, and those who have compromised immune systems or chronic health conditions away from the patient. This includes people with chronic heart, lung, or kidney conditions, diabetes, and cancer.  Ensure good ventilation Make sure that shared spaces in the home have good air flow, such as from an air conditioner or an opened window, weather permitting.  Wash your hands often  Wash your hands often and thoroughly with soap and water for at least 20 seconds. You can use an alcohol based hand sanitizer if soap and water are not available and if your hands are not visibly dirty.  Avoid touching your eyes, nose, and mouth with unwashed hands.  Use disposable paper towels to dry your hands. If not available, use dedicated cloth towels and replace them when they become wet.  Wear a facemask and gloves  Wear a disposable facemask at all times in the room and gloves when you touch or have contact with the patient's blood, body fluids, and/or secretions or excretions, such as sweat, saliva, sputum, nasal mucus, vomit, urine, or feces.  Ensure the mask fits over your nose and mouth tightly, and do not touch it during use.  Throw out disposable facemasks and gloves after using them. Do not reuse.  Wash your hands immediately after removing your facemask and gloves.  If your personal  clothing becomes contaminated, carefully remove clothing and launder. Wash your hands after handling contaminated clothing.  Place all used disposable facemasks, gloves, and other waste in a lined container before disposing them with other household waste.  Remove gloves and wash your hands immediately after handling these items.  Do not share dishes, glasses, or other household items with the patient  Avoid sharing household items. You should not share dishes, drinking glasses, cups, eating utensils, towels, bedding, or other items with a patient who is confirmed to have, or being evaluated for, COVID-19 infection.  After the person uses these items, you should wash them thoroughly with soap and water.  Wash laundry thoroughly  Immediately remove and wash clothes or bedding that have blood, body fluids, and/or secretions or excretions, such as sweat, saliva, sputum, nasal mucus, vomit, urine, or feces, on them.  Wear gloves when handling laundry from the patient.  Read and follow directions on labels of laundry or clothing items and detergent. In general, wash and dry with the warmest temperatures recommended on the label.  Clean all areas the individual has used often  Clean all touchable surfaces, such as counters, tabletops, doorknobs, bathroom fixtures, toilets, phones, keyboards, tablets, and bedside tables, every  day. Also, clean any surfaces that may have blood, body fluids, and/or secretions or excretions on them.  Wear gloves when cleaning surfaces the patient has come in contact with.  Use a diluted bleach solution (e.g., dilute bleach with 1 part bleach and 10 parts water) or a household disinfectant with a label that says EPA-registered for coronaviruses. To make a bleach solution at home, add 1 tablespoon of bleach to 1 quart (4 cups) of water. For a larger supply, add  cup of bleach to 1 gallon (16 cups) of water.  Read labels of cleaning products and follow  recommendations provided on product labels. Labels contain instructions for safe and effective use of the cleaning product including precautions you should take when applying the product, such as wearing gloves or eye protection and making sure you have good ventilation during use of the product.  Remove gloves and wash hands immediately after cleaning.  Monitor yourself for signs and symptoms of illness Caregivers and household members are considered close contacts, should monitor their health, and will be asked to limit movement outside of the home to the extent possible. Follow the monitoring steps for close contacts listed on the symptom monitoring form.   ? If you have additional questions, contact your local health department or call the epidemiologist on call at [phone] (available 24/7). ? This guidance is subject to change. For the most up-to-date guidance from CDC, please refer to their website: YouBlogs.pl    Activity:  1.) NWB to RUE while in splint for 2 weeks, ROM to start gently after that  2.) WBAT RLE  Discharge Instructions    Call MD for:  redness, tenderness, or signs of infection (pain, swelling, redness, odor or green/yellow discharge around incision site)   Complete by: As directed    Diet - low sodium heart healthy   Complete by: As directed    Increase activity slowly   Complete by: As directed      Allergies as of 02/14/2019   No Known Allergies     Medication List    TAKE these medications   acetaminophen 325 MG tablet Commonly known as: TYLENOL Take 2 tablets (650 mg total) by mouth every 6 (six) hours as needed.   enoxaparin 40 MG/0.4ML injection Commonly known as: LOVENOX Inject 0.4 mLs (40 mg total) into the skin daily.   feeding supplement (ENSURE ENLIVE) Liqd Take 237 mLs by mouth 3 (three) times daily between meals.   ferrous sulfate 325 (65 FE) MG tablet Take 1 tablet  (325 mg total) by mouth 2 (two) times daily with a meal.   oxyCODONE 5 MG immediate release tablet Commonly known as: Oxy IR/ROXICODONE Take 1 pills every 4-6 hrs as needed for pain   polyethylene glycol 17 g packet Commonly known as: MIRALAX / GLYCOLAX Take 17 g by mouth daily as needed for mild constipation.   senna-docusate 8.6-50 MG tablet Commonly known as: Senokot-S Take 2 tablets by mouth 2 (two) times daily.   tamsulosin 0.4 MG Caps capsule Commonly known as: FLOMAX Take 1 capsule (0.4 mg total) by mouth daily. Start taking on: February 15, 2019   Vitamin D3 25 MCG tablet Commonly known as: Vitamin D Take 1 tablet (1,000 Units total) by mouth daily. Start taking on: February 15, 2019      Follow-up Information    Hiram Gash, MD In 2 weeks.   Specialty: Orthopedic Surgery Why: For wound re-check Contact information: [address]  [place] [address] [phone]        [name], MD. Schedule an appointment as soon as possible for a visit in 1 week(s).   Specialty: Internal Medicine Contact information: [address] [place] [address] [phone]          No Known Allergies  Consultations:   orthopedic surgery   Other Procedures/Studies: DG Chest 2 View  Result Date: 02/03/2019 CLINICAL DATA:  Fall, confusion at baseline EXAM: CHEST - 2 VIEW COMPARISON:  In a 01/23/2012, 01/27/2008 FINDINGS: Normal heart size, mediastinal contours, and pulmonary vascularity. Atherosclerotic calcification aorta. Emphysematous and bronchitic changes consistent with COPD. Interstitial prominence, chronic. No infiltrate, pleural effusion or pneumothorax. Bones demineralized with mark compression deformity of a lower thoracic vertebra, age indeterminate but new since 2010. IMPRESSION: COPD changes without acute infiltrate. Age-indeterminate compression fracture of a lower thoracic vertebra. Electronically Signed   By: Lavonia Dana M.D.   On:  02/03/2019 10:59   DG Pelvis 1-2 Views  Result Date: 02/03/2019 CLINICAL DATA:  Status post fall. EXAM: PELVIS - 1-2 VIEW COMPARISON:  None. FINDINGS: An acute fracture deformity is seen involving the proximal right femoral shaft. This is just below the intertrochanteric region. Lateral angulation of the fracture site is seen. Mild to moderate severity degenerative changes seen involving the bilateral hips. IMPRESSION: Acute fracture of the proximal right femur. Electronically Signed   By: Virgina Norfolk M.D.   On: 02/03/2019 19:11   DG Pelvis 1-2 Views  Result Date: 02/03/2019 CLINICAL DATA:  Fall, baseline confusion, pain in pelvis when moving for imaging EXAM: PELVIS - 1-2 VIEW COMPARISON:  None FINDINGS: Osseous demineralization. RIGHT SI joint poorly profiled. At LEFT SI joint and hip joint spaces preserved. No definite fracture, dislocation, or bone destruction. IMPRESSION: No definite acute osseous abnormalities. Electronically Signed   By: Lavonia Dana M.D.   On: 02/03/2019 11:00   DG Elbow 2 Views Right  Result Date: 02/09/2019 CLINICAL DATA:  Postoperative evaluation. EXAM: RIGHT ELBOW - 2 VIEW COMPARISON:  February 03, 2019 FINDINGS: It should be noted that the right elbow was imaged in a plaster cast with subsequently obscured osseous and soft tissue detail. A large radiopaque fixation screw is seen within the proximal right ulna. An acute fracture is not identified. There is no evidence of arthropathy or other focal bone abnormality. Soft tissues are unremarkable. IMPRESSION: Prior open reduction internal fixation of the proximal right ulna. Electronically Signed   By: Virgina Norfolk M.D.   On: 02/09/2019 18:58   DG Elbow Complete Right  Result Date: 02/03/2019 CLINICAL DATA:  Unwitnessed fall, swelling and bruising RIGHT elbow EXAM: RIGHT ELBOW - COMPLETE 3+ VIEW COMPARISON:  None FINDINGS: Osseous demineralization. Soft tissue swelling overlying the elbow and proximal forearm.  Intra-articular olecranon fracture, proximal fragment distracted 2.3 cm. Humerus and radius intact. IMPRESSION: Distracted intra-articular olecranon fracture with significant overlying soft tissue swelling. Electronically Signed   By: Lavonia Dana M.D.   On: 02/03/2019 10:57   CT Head Wo Contrast  Result Date: 02/03/2019 CLINICAL DATA:  Trauma to the head and neck. EXAM: CT HEAD WITHOUT CONTRAST CT CERVICAL SPINE WITHOUT CONTRAST TECHNIQUE: Multidetector CT imaging of the head and cervical spine was performed following the standard protocol without intravenous contrast. Multiplanar CT image reconstructions of the cervical spine were also generated. COMPARISON:  01/27/2012 FINDINGS: CT HEAD FINDINGS Brain: The brain does not show accelerated generalized atrophy. No focal finding affects the brainstem or cerebellum. Previous aneurysm clipping at the base  of the brain on the left. 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 Mild chronic small-vessel change of the hemispheric white matter. No hydrocephalus, hemorrhage or extra-axial collection. Vascular: There is atherosclerotic calcification of the major vessels at the base of the brain. Skull: Left pterional craniotomy for previous aneurysm clipping. Otherwise negative. Sinuses/Orbits: Clear/normal Other: None CT CERVICAL SPINE FINDINGS Alignment: Normal except for 2 mm of degenerative anterolisthesis at C4-5. Skull base and vertebrae: No skull base fracture. No cervical spine fracture. Compression fracture at the superior endplate of T1 with loss of height of 10% or less. This is age indeterminate but could be recent. Soft tissues and spinal canal: Negative Disc levels: Chronic spondylosis at C5-6. Mild bilateral bony foraminal narrowing. Chronic facet osteoarthritis on the left at C4-5. Left bony foraminal encroachment because of this. Upper chest: Negative Other: None IMPRESSION: Head CT: No acute or traumatic finding. Previous left pterional  craniotomy for aneurysm clipping. Cervical spine CT: No cervical spine fracture. Minimal superior endplate compression fracture at T1 with loss of height of 10%. This is age indeterminate but could possibly be recent. Degenerative spondylosis at C5-6. Degenerative facet arthritis on the left at C4-5. Electronically Signed   By: Nelson Chimes M.D.   On: 02/03/2019 11:13   CT Cervical Spine Wo Contrast  Result Date: 02/03/2019 CLINICAL DATA:  Trauma to the head and neck. EXAM: CT HEAD WITHOUT CONTRAST CT CERVICAL SPINE WITHOUT CONTRAST TECHNIQUE: Multidetector CT imaging of the head and cervical spine was performed following the standard protocol without intravenous contrast. Multiplanar CT image reconstructions of the cervical spine were also generated. COMPARISON:  01/27/2012 FINDINGS: CT HEAD FINDINGS Brain: The brain does not show accelerated generalized atrophy. No focal finding affects the brainstem or cerebellum. 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 Mild chronic small-vessel change of the hemispheric white matter. No hydrocephalus, hemorrhage or extra-axial collection. Vascular: There is atherosclerotic calcification of the major vessels at the base of the brain. Skull: Left pterional craniotomy for previous aneurysm clipping. Otherwise negative. Sinuses/Orbits: Clear/normal Other: None CT CERVICAL SPINE FINDINGS Alignment: Normal except for 2 mm of degenerative anterolisthesis at C4-5. Skull base and vertebrae: No skull base fracture. No cervical spine fracture. Compression fracture at the superior endplate of T1 with loss of height of 10% or less. This is age indeterminate but could be recent. Soft tissues and spinal canal: Negative Disc levels: Chronic spondylosis at C5-6. Mild bilateral bony foraminal narrowing. Chronic facet osteoarthritis on the left at C4-5. Left bony foraminal encroachment because of this. Upper chest:  Negative Other: None IMPRESSION: Head CT: No acute or traumatic finding. Previous left pterional craniotomy for aneurysm clipping. Cervical spine CT: No cervical spine fracture. Minimal superior endplate compression fracture at T1 with loss of height of 10%. This is age indeterminate but could possibly be recent. Degenerative spondylosis at C5-6. Degenerative facet arthritis on the left at C4-5. Electronically Signed   By: Nelson Chimes M.D.   On: 02/03/2019 11:13   CT Hip Right Wo Contrast  Result Date: 02/03/2019 CLINICAL DATA:  Hip fracture EXAM: CT OF THE RIGHT HIP WITHOUT CONTRAST TECHNIQUE: Multidetector CT imaging of the right hip was performed according to the standard protocol. Multiplanar CT image reconstructions were also generated. COMPARISON:  Femur fracture 02/03/2019 FINDINGS: Bones/Joint/Cartilage The osseous structures appear diffusely demineralized which may limit detection of small or nondisplaced fractures. Comminuted extracapsular right femur fracture with  predominantly intertrochanteric extension but with complete separation of the greater and lesser trochanters from both the femoral neck and the subtrochanteric region (AO/OTA A3.3). This is an unstable femur fracture. Hyperdense hemorrhage is noted within the bony trabecula a about the fracture site. The femoral head remains normally located. There are chronic degenerative changes of the acetabuli. Periacetabular spurring and os acetabuli likely reflects sequela of prior glenoid degenerative change. Remaining imaged portions of the pelvic bones are intact. Ligaments Suboptimally assessed by CT. Muscles and Tendons There is soft tissue swelling and edematous changes throughout the musculature of the proximal thigh. No large joint effusion is seen likely as this fracture is extracapsular in nature. Soft tissues Extensive right hip soft tissue swelling and edematous changes. Hemorrhage about the fracture line within the bony trabecula, as  detailed above. Included portions of the pelvis demonstrates some atherosclerosis and un inflamed colonic diverticula but are otherwise unremarkable. IMPRESSION: Unstable comminuted extracapsular right femur fracture with predominantly intertrochanteric extension but with complete separation of the greater and lesser trochanters from both the femoral neck and the subtrochanteric region (AO/OTA A3.3). Hyperdense hemorrhage is noted within the bony trabecula about the fracture site. No large joint effusion likely as this fracture is extracapsular in nature. Associated right hip soft tissue swelling and edema. More chronic degenerative changes the right Electronically Signed   By: Lovena Le M.D.   On: 02/03/2019 22:47   CT ELBOW RIGHT WO CONTRAST  Result Date: 02/07/2019 CLINICAL DATA:  Right elbow pain status post fall, preop 02/09/2019. EXAM: CT OF THE UPPER RIGHT EXTREMITY WITHOUT CONTRAST TECHNIQUE: Multidetector CT imaging of the upper right extremity was performed according to the standard protocol. COMPARISON:  None. FINDINGS: Bones/Joint/Cartilage Limited evaluation secondary to positioning. Comminuted fracture of the olecranon with 2 cm of distraction. Mild cortical irregularity along the posterior aspect of the capitellum concerning for small marginal osteophyte versus a subtle nondisplaced fracture (image 31/series 4). Large joint effusion. No other acute fracture or dislocation. No aggressive osseous lesion. Ligaments Suboptimally assessed by CT. Muscles and Tendons Muscles are normal. No muscle atrophy. Biceps tendon is intact. Triceps tendon is intact. Soft tissues No fluid collection or hematoma. IMPRESSION: Comminuted fracture of the olecranon with 2 cm of distraction. Mild cortical irregularity along the posterior aspect of the capitellum concerning for small marginal osteophyte versus a subtle nondisplaced fracture (image 31/series 4). Electronically Signed   By: Kathreen Devoid   On: 02/07/2019  16:39   DG CHEST PORT 1 VIEW  Result Date: 02/04/2019 CLINICAL DATA:  Oxygen desaturation. EXAM: PORTABLE CHEST 1 VIEW COMPARISON:  February 03, 2019 FINDINGS: The heart, hila, and mediastinum are normal. No pneumothorax. No nodules or masses. No focal infiltrates. IMPRESSION: No active disease. Electronically Signed   By: Dorise Bullion III M.D   On: 02/04/2019 19:52   DG C-Arm 1-60 Min  Result Date: 02/04/2019 CLINICAL DATA:  Femur fracture fixation EXAM: DG C-ARM 1-60 MIN; RIGHT FEMUR 2 VIEWS FLUOROSCOPY TIME:  Fluoroscopy Time:  1 minutes 50 seconds Number of Acquired Spot Images: 4 COMPARISON:  None. FINDINGS: Intraoperative radiographs demonstrate trochanteric femoral nail and intramedullary rod fixation proximal right femur shaft fracture. There are 2 distal interlocking screws. Alignment appears improved. IMPRESSION: Fluoroscopic guidance for fixation of right femur fracture with improved alignment Electronically Signed   By: Macy Mis M.D.   On: 02/04/2019 16:51   DG HIP PORT UNILAT WITH PELVIS 1V RIGHT  Result Date: 02/11/2019 CLINICAL DATA:  Postoperative evaluation. EXAM: DG HIP (  WITH OR WITHOUT PELVIS) 1V PORT RIGHT COMPARISON:  02/04/2019. FINDINGS: Patient status post ORIF right femur. Hardware intact and in stable position. Stable alignment. Diffuse osteopenia. Degenerative changes both hips. Pelvic calcifications consistent phleboliths. IMPRESSION: ORIF right femur.  Hardware intact.  Stable alignment. Electronically Signed   By: Marcello Moores  Register   On: 02/11/2019 08:44   DG FEMUR, MIN 2 VIEWS RIGHT  Result Date: 02/04/2019 CLINICAL DATA:  Femur fracture fixation EXAM: DG C-ARM 1-60 MIN; RIGHT FEMUR 2 VIEWS FLUOROSCOPY TIME:  Fluoroscopy Time:  1 minutes 50 seconds Number of Acquired Spot Images: 4 COMPARISON:  None. FINDINGS: Intraoperative radiographs demonstrate trochanteric femoral nail and intramedullary rod fixation proximal right femur shaft fracture. There are 2 distal  interlocking screws. Alignment appears improved. IMPRESSION: Fluoroscopic guidance for fixation of right femur fracture with improved alignment Electronically Signed   By: Macy Mis M.D.   On: 02/04/2019 16:51   DG FEMUR PORT, MIN 2 VIEWS RIGHT  Result Date: 02/04/2019 CLINICAL DATA:  Postoperative evaluation. EXAM: RIGHT FEMUR PORTABLE 2 VIEW COMPARISON:  None. FINDINGS: A radiopaque intramedullary rod is seen along the length of the right femur. A radiopaque compression screw device is noted within the right femoral head and neck. An inter trochanteric fracture of the proximal right femur is seen with gross anatomic alignment. Soft tissues are unremarkable. IMPRESSION: Status post open reduction and internal fixation of the proximal right femur. Electronically Signed   By: Virgina Norfolk M.D.   On: 02/04/2019 18:07   DG Femur Portable Min 2 Views Right  Result Date: 02/03/2019 CLINICAL DATA:  Status post fall. EXAM: RIGHT FEMUR PORTABLE 2 VIEW COMPARISON:  None. FINDINGS: Acute fracture deformity is seen involving shaft of the proximal right femur. This extends to the level just below the intertrochanteric region. Superolateral angulation of the fracture site is seen. There is no evidence of dislocation. IMPRESSION: Acute fracture of the proximal right femur. Electronically Signed   By: Virgina Norfolk M.D.   On: 02/03/2019 19:12   VAS [place] LOWER EXTREMITY VENOUS (DVT)  Result Date: 02/13/2019  Lower Venous DVTStudy Indications: Edema, and Covid+, Elevated D dimer.  Limitations: Patient is contracted, right hip fracture, and uncooperative. Comparison Study: No prior exam. Performing Technologist: Baldwin Crown RDMS, RVT  Examination Guidelines: A complete evaluation includes B-mode imaging, spectral Doppler, color Doppler, and power Doppler as needed of all accessible portions of each vessel. Bilateral testing is considered an integral part of a complete examination. Limited examinations for  reoccurring indications may be performed as noted. The reflux portion of the exam is performed with the patient in reverse Trendelenburg.  +---------+---------------+---------+-----------+----------+--------------+ RIGHT    CompressibilityPhasicitySpontaneityPropertiesThrombus Aging +---------+---------------+---------+-----------+----------+--------------+ CFV      Full           Yes      Yes                                 +---------+---------------+---------+-----------+----------+--------------+ SFJ      Full                                                        +---------+---------------+---------+-----------+----------+--------------+ FV Prox  Full                                                        +---------+---------------+---------+-----------+----------+--------------+  FV Mid   Full                                                        +---------+---------------+---------+-----------+----------+--------------+ FV DistalFull                                                        +---------+---------------+---------+-----------+----------+--------------+ PFV      Full                                                        +---------+---------------+---------+-----------+----------+--------------+ POP      Full           Yes      Yes                                 +---------+---------------+---------+-----------+----------+--------------+ PTV      Full                                                        +---------+---------------+---------+-----------+----------+--------------+ PERO                                                  Not visualized +---------+---------------+---------+-----------+----------+--------------+   +---------+---------------+---------+-----------+----------+--------------+ LEFT     CompressibilityPhasicitySpontaneityPropertiesThrombus Aging  +---------+---------------+---------+-----------+----------+--------------+ CFV      Full           Yes      Yes                                 +---------+---------------+---------+-----------+----------+--------------+ SFJ      Full                                                        +---------+---------------+---------+-----------+----------+--------------+ FV Prox  Full                                                        +---------+---------------+---------+-----------+----------+--------------+ FV Mid   Full                                                        +---------+---------------+---------+-----------+----------+--------------+  FV DistalFull                                                        +---------+---------------+---------+-----------+----------+--------------+ PFV      Full                                                        +---------+---------------+---------+-----------+----------+--------------+ POP      Full                                                        +---------+---------------+---------+-----------+----------+--------------+ PTV      Full                                                        +---------+---------------+---------+-----------+----------+--------------+ PERO                                                  Not visualized +---------+---------------+---------+-----------+----------+--------------+ Poorly visuaslized calf veins due to patient contracted and uncooperative.    Summary: BILATERAL: - No evidence of deep vein thrombosis seen in the lower extremities, bilaterally.  RIGHT: - No cystic structure found in the popliteal fossa.  LEFT: - No cystic structure found in the popliteal fossa.  *See table(s) above for measurements and observations. Electronically signed by Ruta Hinds MD on 02/13/2019 at 11:36:20 AM.    Final      TODAY-DAY OF DISCHARGE:  Subjective:   [name]  today remains pleasantly confused.  Per nursing staff-eating around 25 to 30% of her meals-and drinking supplements on top of that.   Objective:   Blood pressure (!) 93/52, pulse 84, temperature 98.2 F (36.8 C), temperature source Oral, resp. rate 18, height 5\' 6"  (1.676 m), weight 46.2 kg, SpO2 100 %.  Intake/Output Summary (Last 24 hours) at 02/14/2019 1117 Last data filed at 02/14/2019 0848 Gross per 24 hour  Intake 380 ml  Output 1310 ml  Net -930 ml   Filed Weights   02/03/19 1757 02/11/19 0517 02/12/19 0527  Weight: 41 kg 45.9 kg 46.2 kg    Exam: Alert-pleasantly confused, No new F.N deficits, Normal affect [place].AT,PERRAL Supple Neck,No JVD, No cervical lymphadenopathy appriciated.  Symmetrical Chest wall movement, Good air movement bilaterally, CTAB RRR,No Gallops,Rubs or new Murmurs, No Parasternal Heave +ve B.Sounds, Abd Soft, Non tender, No organomegaly appriciated, No rebound -guarding or rigidity. No Cyanosis, Clubbing or edema, No new Rash or bruise   PERTINENT RADIOLOGIC STUDIES: DG Chest 2 View  Result Date: 02/03/2019 CLINICAL DATA:  Fall, confusion at baseline EXAM: CHEST - 2 VIEW COMPARISON:  In a 01/23/2012, 01/27/2008 FINDINGS: Normal heart size, mediastinal contours, and pulmonary vascularity. Atherosclerotic calcification aorta. Emphysematous and  bronchitic changes consistent with COPD. Interstitial prominence, chronic. No infiltrate, pleural effusion or pneumothorax. Bones demineralized with mark compression deformity of a lower thoracic vertebra, age indeterminate but new since 2010. IMPRESSION: COPD changes without acute infiltrate. Age-indeterminate compression fracture of a lower thoracic vertebra. Electronically Signed   By: Lavonia Dana M.D.   On: 02/03/2019 10:59   DG Pelvis 1-2 Views  Result Date: 02/03/2019 CLINICAL DATA:  Status post fall. EXAM: PELVIS - 1-2 VIEW COMPARISON:  None. FINDINGS: An acute fracture deformity is seen involving the proximal  right femoral shaft. This is just below the intertrochanteric region. Lateral angulation of the fracture site is seen. Mild to moderate severity degenerative changes seen involving the bilateral hips. IMPRESSION: Acute fracture of the proximal right femur. Electronically Signed   By: Virgina Norfolk M.D.   On: 02/03/2019 19:11   DG Pelvis 1-2 Views  Result Date: 02/03/2019 CLINICAL DATA:  Fall, baseline confusion, pain in pelvis when moving for imaging EXAM: PELVIS - 1-2 VIEW COMPARISON:  None FINDINGS: Osseous demineralization. RIGHT SI joint poorly profiled. At LEFT SI joint and hip joint spaces preserved. No definite fracture, dislocation, or bone destruction. IMPRESSION: No definite acute osseous abnormalities. Electronically Signed   By: Lavonia Dana M.D.   On: 02/03/2019 11:00   DG Elbow 2 Views Right  Result Date: 02/09/2019 CLINICAL DATA:  Postoperative evaluation. EXAM: RIGHT ELBOW - 2 VIEW COMPARISON:  February 03, 2019 FINDINGS: It should be noted that the right elbow was imaged in a plaster cast with subsequently obscured osseous and soft tissue detail. A large radiopaque fixation screw is seen within the proximal right ulna. An acute fracture is not identified. There is no evidence of arthropathy or other focal bone abnormality. Soft tissues are unremarkable. IMPRESSION: Prior open reduction internal fixation of the proximal right ulna. Electronically Signed   By: Virgina Norfolk M.D.   On: 02/09/2019 18:58   DG Elbow Complete Right  Result Date: 02/03/2019 CLINICAL DATA:  Unwitnessed fall, swelling and bruising RIGHT elbow EXAM: RIGHT ELBOW - COMPLETE 3+ VIEW COMPARISON:  None FINDINGS: Osseous demineralization. Soft tissue swelling overlying the elbow and proximal forearm. Intra-articular olecranon fracture, proximal fragment distracted 2.3 cm. Humerus and radius intact. IMPRESSION: Distracted intra-articular olecranon fracture with significant overlying soft tissue swelling.  Electronically Signed   By: Lavonia Dana M.D.   On: 02/03/2019 10:57   CT Head Wo Contrast  Result Date: 02/03/2019 CLINICAL DATA:  Trauma to the head and neck. EXAM: CT HEAD WITHOUT CONTRAST CT CERVICAL SPINE WITHOUT CONTRAST TECHNIQUE: Multidetector CT imaging of the head and cervical spine was performed following the standard protocol without intravenous contrast. Multiplanar CT image reconstructions of the cervical spine were also generated. COMPARISON:  01/27/2012 FINDINGS: CT HEAD FINDINGS Brain: The brain does not show accelerated generalized atrophy. No focal finding affects the brainstem or cerebellum. 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 Mild chronic small-vessel change of the hemispheric white matter. No hydrocephalus, hemorrhage or extra-axial collection. Vascular: There is atherosclerotic calcification of the major vessels at the base of the brain. Skull: Left pterional craniotomy for previous aneurysm clipping. Otherwise negative. Sinuses/Orbits: Clear/normal Other: None CT CERVICAL SPINE FINDINGS Alignment: Normal except for 2 mm of degenerative anterolisthesis at C4-5. Skull base and vertebrae: No skull base fracture. No cervical spine fracture. Compression fracture at the superior endplate of T1 with loss of height of 10% or less. This  is age indeterminate but could be recent. Soft tissues and spinal canal: Negative Disc levels: Chronic spondylosis at C5-6. Mild bilateral bony foraminal narrowing. Chronic facet osteoarthritis on the left at C4-5. Left bony foraminal encroachment because of this. Upper chest: Negative Other: None IMPRESSION: Head CT: No acute or traumatic finding. Previous left pterional craniotomy for aneurysm clipping. Cervical spine CT: No cervical spine fracture. Minimal superior endplate compression fracture at T1 with loss of height of 10%. This is age indeterminate but could possibly be  recent. Degenerative spondylosis at C5-6. Degenerative facet arthritis on the left at C4-5. Electronically Signed   By: Nelson Chimes M.D.   On: 02/03/2019 11:13   CT Cervical Spine Wo Contrast  Result Date: 02/03/2019 CLINICAL DATA:  Trauma to the head and neck. EXAM: CT HEAD WITHOUT CONTRAST CT CERVICAL SPINE WITHOUT CONTRAST TECHNIQUE: Multidetector CT imaging of the head and cervical spine was performed following the standard protocol without intravenous contrast. Multiplanar CT image reconstructions of the cervical spine were also generated. COMPARISON:  01/27/2012 FINDINGS: CT HEAD FINDINGS Brain: The brain does not show accelerated generalized atrophy. No focal finding affects the brainstem or cerebellum. 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 Mild chronic small-vessel change of the hemispheric white matter. No hydrocephalus, hemorrhage or extra-axial collection. Vascular: There is atherosclerotic calcification of the major vessels at the base of the brain. Skull: Left pterional craniotomy for previous aneurysm clipping. Otherwise negative. Sinuses/Orbits: Clear/normal Other: None CT CERVICAL SPINE FINDINGS Alignment: Normal except for 2 mm of degenerative anterolisthesis at C4-5. Skull base and vertebrae: No skull base fracture. No cervical spine fracture. Compression fracture at the superior endplate of T1 with loss of height of 10% or less. This is age indeterminate but could be recent. Soft tissues and spinal canal: Negative Disc levels: Chronic spondylosis at C5-6. Mild bilateral bony foraminal narrowing. Chronic facet osteoarthritis on the left at C4-5. Left bony foraminal encroachment because of this. Upper chest: Negative Other: None IMPRESSION: Head CT: No acute or traumatic finding. Previous left pterional craniotomy for aneurysm clipping. Cervical spine CT: No cervical spine fracture. Minimal superior endplate  compression fracture at T1 with loss of height of 10%. This is age indeterminate but could possibly be recent. Degenerative spondylosis at C5-6. Degenerative facet arthritis on the left at C4-5. Electronically Signed   By: Nelson Chimes M.D.   On: 02/03/2019 11:13   CT Hip Right Wo Contrast  Result Date: 02/03/2019 CLINICAL DATA:  Hip fracture EXAM: CT OF THE RIGHT HIP WITHOUT CONTRAST TECHNIQUE: Multidetector CT imaging of the right hip was performed according to the standard protocol. Multiplanar CT image reconstructions were also generated. COMPARISON:  Femur fracture 02/03/2019 FINDINGS: Bones/Joint/Cartilage The osseous structures appear diffusely demineralized which may limit detection of small or nondisplaced fractures. Comminuted extracapsular right femur fracture with predominantly intertrochanteric extension but with complete separation of the greater and lesser trochanters from both the femoral neck and the subtrochanteric region (AO/OTA A3.3). This is an unstable femur fracture. Hyperdense hemorrhage is noted within the bony trabecula a about the fracture site. The femoral head remains normally located. There are chronic degenerative changes of the acetabuli. Periacetabular spurring and os acetabuli likely reflects sequela of prior glenoid degenerative change. Remaining imaged portions of the pelvic bones are intact. Ligaments Suboptimally assessed by CT. Muscles and Tendons There is soft tissue swelling and edematous changes throughout the musculature of the proximal thigh. No  large joint effusion is seen likely as this fracture is extracapsular in nature. Soft tissues Extensive right hip soft tissue swelling and edematous changes. Hemorrhage about the fracture line within the bony trabecula, as detailed above. Included portions of the pelvis demonstrates some atherosclerosis and un inflamed colonic diverticula but are otherwise unremarkable. IMPRESSION: Unstable comminuted extracapsular right femur  fracture with predominantly intertrochanteric extension but with complete separation of the greater and lesser trochanters from both the femoral neck and the subtrochanteric region (AO/OTA A3.3). Hyperdense hemorrhage is noted within the bony trabecula about the fracture site. No large joint effusion likely as this fracture is extracapsular in nature. Associated right hip soft tissue swelling and edema. More chronic degenerative changes the right Electronically Signed   By: Lovena Le M.D.   On: 02/03/2019 22:47   CT ELBOW RIGHT WO CONTRAST  Result Date: 02/07/2019 CLINICAL DATA:  Right elbow pain status post fall, preop 02/09/2019. EXAM: CT OF THE UPPER RIGHT EXTREMITY WITHOUT CONTRAST TECHNIQUE: Multidetector CT imaging of the upper right extremity was performed according to the standard protocol. COMPARISON:  None. FINDINGS: Bones/Joint/Cartilage Limited evaluation secondary to positioning. Comminuted fracture of the olecranon with 2 cm of distraction. Mild cortical irregularity along the posterior aspect of the capitellum concerning for small marginal osteophyte versus a subtle nondisplaced fracture (image 31/series 4). Large joint effusion. No other acute fracture or dislocation. No aggressive osseous lesion. Ligaments Suboptimally assessed by CT. Muscles and Tendons Muscles are normal. No muscle atrophy. Biceps tendon is intact. Triceps tendon is intact. Soft tissues No fluid collection or hematoma. IMPRESSION: Comminuted fracture of the olecranon with 2 cm of distraction. Mild cortical irregularity along the posterior aspect of the capitellum concerning for small marginal osteophyte versus a subtle nondisplaced fracture (image 31/series 4). Electronically Signed   By: Kathreen Devoid   On: 02/07/2019 16:39   DG CHEST PORT 1 VIEW  Result Date: 02/04/2019 CLINICAL DATA:  Oxygen desaturation. EXAM: PORTABLE CHEST 1 VIEW COMPARISON:  February 03, 2019 FINDINGS: The heart, hila, and mediastinum are normal. No  pneumothorax. No nodules or masses. No focal infiltrates. IMPRESSION: No active disease. Electronically Signed   By: Dorise Bullion III M.D   On: 02/04/2019 19:52   DG C-Arm 1-60 Min  Result Date: 02/04/2019 CLINICAL DATA:  Femur fracture fixation EXAM: DG C-ARM 1-60 MIN; RIGHT FEMUR 2 VIEWS FLUOROSCOPY TIME:  Fluoroscopy Time:  1 minutes 50 seconds Number of Acquired Spot Images: 4 COMPARISON:  None. FINDINGS: Intraoperative radiographs demonstrate trochanteric femoral nail and intramedullary rod fixation proximal right femur shaft fracture. There are 2 distal interlocking screws. Alignment appears improved. IMPRESSION: Fluoroscopic guidance for fixation of right femur fracture with improved alignment Electronically Signed   By: Macy Mis M.D.   On: 02/04/2019 16:51   DG HIP PORT UNILAT WITH PELVIS 1V RIGHT  Result Date: 02/11/2019 CLINICAL DATA:  Postoperative evaluation. EXAM: DG HIP (WITH OR WITHOUT PELVIS) 1V PORT RIGHT COMPARISON:  02/04/2019. FINDINGS: Patient status post ORIF right femur. Hardware intact and in stable position. Stable alignment. Diffuse osteopenia. Degenerative changes both hips. Pelvic calcifications consistent phleboliths. IMPRESSION: ORIF right femur.  Hardware intact.  Stable alignment. Electronically Signed   By: Marcello Moores  Register   On: 02/11/2019 08:44   DG FEMUR, MIN 2 VIEWS RIGHT  Result Date: 02/04/2019 CLINICAL DATA:  Femur fracture fixation EXAM: DG C-ARM 1-60 MIN; RIGHT FEMUR 2 VIEWS FLUOROSCOPY TIME:  Fluoroscopy Time:  1 minutes 50 seconds Number of Acquired Spot Images: 4 COMPARISON:  None. FINDINGS: Intraoperative radiographs demonstrate trochanteric femoral nail and intramedullary rod fixation proximal right femur shaft fracture. There are 2 distal interlocking screws. Alignment appears improved. IMPRESSION: Fluoroscopic guidance for fixation of right femur fracture with improved alignment Electronically Signed   By: Macy Mis M.D.   On: 02/04/2019 16:51    DG FEMUR PORT, MIN 2 VIEWS RIGHT  Result Date: 02/04/2019 CLINICAL DATA:  Postoperative evaluation. EXAM: RIGHT FEMUR PORTABLE 2 VIEW COMPARISON:  None. FINDINGS: A radiopaque intramedullary rod is seen along the length of the right femur. A radiopaque compression screw device is noted within the right femoral head and neck. An inter trochanteric fracture of the proximal right femur is seen with gross anatomic alignment. Soft tissues are unremarkable. IMPRESSION: Status post open reduction and internal fixation of the proximal right femur. Electronically Signed   By: Virgina Norfolk M.D.   On: 02/04/2019 18:07   DG Femur Portable Min 2 Views Right  Result Date: 02/03/2019 CLINICAL DATA:  Status post fall. EXAM: RIGHT FEMUR PORTABLE 2 VIEW COMPARISON:  None. FINDINGS: Acute fracture deformity is seen involving shaft of the proximal right femur. This extends to the level just below the intertrochanteric region. Superolateral angulation of the fracture site is seen. There is no evidence of dislocation. IMPRESSION: Acute fracture of the proximal right femur. Electronically Signed   By: Virgina Norfolk M.D.   On: 02/03/2019 19:12   VAS [place] LOWER EXTREMITY VENOUS (DVT)  Result Date: 02/13/2019  Lower Venous DVTStudy Indications: Edema, and Covid+, Elevated D dimer.  Limitations: Patient is contracted, right hip fracture, and uncooperative. Comparison Study: No prior exam. Performing Technologist: Baldwin Crown RDMS, RVT  Examination Guidelines: A complete evaluation includes B-mode imaging, spectral Doppler, color Doppler, and power Doppler as needed of all accessible portions of each vessel. Bilateral testing is considered an integral part of a complete examination. Limited examinations for reoccurring indications may be performed as noted. The reflux portion of the exam is performed with the patient in reverse Trendelenburg.  +---------+---------------+---------+-----------+----------+--------------+  RIGHT    CompressibilityPhasicitySpontaneityPropertiesThrombus Aging +---------+---------------+---------+-----------+----------+--------------+ CFV      Full           Yes      Yes                                 +---------+---------------+---------+-----------+----------+--------------+ SFJ      Full                                                        +---------+---------------+---------+-----------+----------+--------------+ FV Prox  Full                                                        +---------+---------------+---------+-----------+----------+--------------+ FV Mid   Full                                                        +---------+---------------+---------+-----------+----------+--------------+ FV DistalFull                                                        +---------+---------------+---------+-----------+----------+--------------+  PFV      Full                                                        +---------+---------------+---------+-----------+----------+--------------+ POP      Full           Yes      Yes                                 +---------+---------------+---------+-----------+----------+--------------+ PTV      Full                                                        +---------+---------------+---------+-----------+----------+--------------+ PERO                                                  Not visualized +---------+---------------+---------+-----------+----------+--------------+   +---------+---------------+---------+-----------+----------+--------------+ LEFT     CompressibilityPhasicitySpontaneityPropertiesThrombus Aging +---------+---------------+---------+-----------+----------+--------------+ CFV      Full           Yes      Yes                                 +---------+---------------+---------+-----------+----------+--------------+ SFJ      Full                                                         +---------+---------------+---------+-----------+----------+--------------+ FV Prox  Full                                                        +---------+---------------+---------+-----------+----------+--------------+ FV Mid   Full                                                        +---------+---------------+---------+-----------+----------+--------------+ FV DistalFull                                                        +---------+---------------+---------+-----------+----------+--------------+ PFV      Full                                                        +---------+---------------+---------+-----------+----------+--------------+  POP      Full                                                        +---------+---------------+---------+-----------+----------+--------------+ PTV      Full                                                        +---------+---------------+---------+-----------+----------+--------------+ PERO                                                  Not visualized +---------+---------------+---------+-----------+----------+--------------+ Poorly visuaslized calf veins due to patient contracted and uncooperative.    Summary: BILATERAL: - No evidence of deep vein thrombosis seen in the lower extremities, bilaterally.  RIGHT: - No cystic structure found in the popliteal fossa.  LEFT: - No cystic structure found in the popliteal fossa.  *See table(s) above for measurements and observations. Electronically signed by Ruta Hinds MD on 02/13/2019 at 11:36:20 AM.    Final      PERTINENT LAB RESULTS: CBC: Recent Labs    02/12/19 0726  WBC 11.9*  HGB 8.7*  HCT 27.4*  PLT 501*   CMET CMP     Component Value Date/Time   NA 141 02/13/2019 0237   K 4.0 02/13/2019 0237   CL 104 02/13/2019 0237   CO2 27 02/13/2019 0237   GLUCOSE 103 (H) 02/13/2019 0237   BUN 21 02/13/2019 0237   CREATININE 0.63  02/13/2019 0237   CALCIUM 8.9 02/13/2019 0237   PROT 5.7 (L) 02/12/2019 0726   ALBUMIN 2.6 (L) 02/12/2019 0726   AST 39 02/12/2019 0726   ALT 12 02/12/2019 0726   ALKPHOS 65 02/12/2019 0726   BILITOT 0.8 02/12/2019 0726   GFRNONAA >60 02/13/2019 0237   GFRAA >60 02/13/2019 0237    GFR Estimated Creatinine Clearance: 39.5 mL/min (by C-G formula based on SCr of 0.63 mg/dL). No results for input(s): LIPASE, AMYLASE in the last 72 hours. No results for input(s): CKTOTAL, CKMB, CKMBINDEX, TROPONINI in the last 72 hours. Invalid input(s): POCBNP Recent Labs    02/12/19 0706 02/13/19 0237  DDIMER 5.60* 6.69*   No results for input(s): HGBA1C in the last 72 hours. No results for input(s): CHOL, HDL, LDLCALC, TRIG, CHOLHDL, LDLDIRECT in the last 72 hours. No results for input(s): TSH, T4TOTAL, T3FREE, THYROIDAB in the last 72 hours.  Invalid input(s): FREET3 No results for input(s): VITAMINB12, FOLATE, FERRITIN, TIBC, IRON, RETICCTPCT in the last 72 hours. Coags: No results for input(s): INR in the last 72 hours.  Invalid input(s): PT Microbiology: Recent Results (from the past 240 hour(s))  Surgical PCR screen     Status: None   Collection Time: 02/04/19  1:12 PM   Specimen: Nasal Mucosa; Nasal Swab  Result Value Ref Range Status   MRSA, PCR NEGATIVE NEGATIVE Final   Staphylococcus aureus NEGATIVE NEGATIVE Final    Comment: (NOTE) The Xpert SA Assay (FDA approved for NASAL specimens in patients 81 years of age and older), is one  component of a comprehensive surveillance program. It is not intended to diagnose infection nor to guide or monitor treatment. Performed at [facility], [place] [address] [place], [place] [address]     FURTHER DISCHARGE INSTRUCTIONS:  Get Medicines reviewed and adjusted: Please take all your medications with you for your next visit with your Primary MD  Laboratory/radiological data: Please request your Primary MD to go over all hospital  tests and procedure/radiological results at the follow up, please ask your Primary MD to get all Hospital records sent to his/her office.  In some cases, they will be blood work, cultures and biopsy results pending at the time of your discharge. Please request that your primary care M.D. goes through all the records of your hospital data and follows up on these results.  Also Note the following: If you experience worsening of your admission symptoms, develop shortness of breath, life threatening emergency, suicidal or homicidal thoughts you must seek medical attention immediately by calling 911 or calling your MD immediately  if symptoms less severe.  You must read complete instructions/literature along with all the possible adverse reactions/side effects for all the Medicines you take and that have been prescribed to you. Take any new Medicines after you have completely understood and accpet all the possible adverse reactions/side effects.   Do not drive when taking Pain medications or sleeping medications (Benzodaizepines)  Do not take more than prescribed Pain, Sleep and Anxiety Medications. It is not advisable to combine anxiety,sleep and pain medications without talking with your primary care practitioner  Special Instructions: If you have smoked or chewed Tobacco  in the last 2 yrs please stop smoking, stop any regular Alcohol  and or any Recreational drug use.  Wear Seat belts while driving.  Please note: You were cared for by a hospitalist during your hospital stay. Once you are discharged, your primary care physician will handle any further medical issues. Please note that NO REFILLS for any discharge medications will be authorized once you are discharged, as it is imperative that you return to your primary care physician (or establish a relationship with a primary care physician if you do not have one) for your post hospital discharge needs so that they can reassess your need for  medications and monitor your lab values.  Total Time spent coordinating discharge including counseling, education and face to face time equals 35 minutes.  SignedOren Binet 02/14/2019 11:17 AM

## 2019-02-14 NOTE — Progress Notes (Signed)
[name] to be D/C'd [place]  per MD order.  Discussed with the patient and all questions fully answered.  VSS, Skin clean, dry and intact without evidence of skin break down, no evidence of skin tears noted. IV catheter discontinued intact. Site without signs and symptoms of complications. Dressing and pressure applied.  An After Visit Summary was printed and given to the patient. Patient received prescription.  D/c education completed with patient/family including follow up instructions, medication list, d/c activities limitations if indicated, with other d/c instructions as indicated by MD - patient able to verbalize understanding, all questions fully answered.   Patient instructed to return to ED, call 911, or call MD for any changes in condition.   Patient escorted [facility] to [facility],  Report given to Nurse [facility] from [facility].

## 2019-02-15 DIAGNOSIS — R52 Pain, unspecified: Secondary | ICD-10-CM | POA: Diagnosis not present

## 2019-02-15 DIAGNOSIS — S52024D Nondisplaced fracture of olecranon process without intraarticular extension of right ulna, subsequent encounter for closed fracture with routine healing: Secondary | ICD-10-CM | POA: Diagnosis not present

## 2019-02-15 DIAGNOSIS — K5900 Constipation, unspecified: Secondary | ICD-10-CM | POA: Diagnosis not present

## 2019-02-15 DIAGNOSIS — R627 Adult failure to thrive: Secondary | ICD-10-CM | POA: Diagnosis not present

## 2019-02-15 DIAGNOSIS — R269 Unspecified abnormalities of gait and mobility: Secondary | ICD-10-CM | POA: Diagnosis not present

## 2019-02-15 DIAGNOSIS — S72361D Displaced segmental fracture of shaft of right femur, subsequent encounter for closed fracture with routine healing: Secondary | ICD-10-CM | POA: Diagnosis not present

## 2019-02-15 DIAGNOSIS — R339 Retention of urine, unspecified: Secondary | ICD-10-CM | POA: Diagnosis not present

## 2019-02-15 DIAGNOSIS — F0390 Unspecified dementia without behavioral disturbance: Secondary | ICD-10-CM | POA: Diagnosis not present

## 2019-02-15 DIAGNOSIS — U071 COVID-19: Secondary | ICD-10-CM | POA: Diagnosis not present

## 2019-02-18 DIAGNOSIS — U071 COVID-19: Secondary | ICD-10-CM | POA: Diagnosis not present

## 2019-02-18 DIAGNOSIS — F0390 Unspecified dementia without behavioral disturbance: Secondary | ICD-10-CM | POA: Diagnosis not present

## 2019-02-18 DIAGNOSIS — D649 Anemia, unspecified: Secondary | ICD-10-CM | POA: Diagnosis not present

## 2019-02-18 DIAGNOSIS — K5900 Constipation, unspecified: Secondary | ICD-10-CM | POA: Diagnosis not present

## 2019-02-22 DIAGNOSIS — E46 Unspecified protein-calorie malnutrition: Secondary | ICD-10-CM | POA: Diagnosis not present

## 2019-02-22 DIAGNOSIS — R627 Adult failure to thrive: Secondary | ICD-10-CM | POA: Diagnosis not present

## 2019-02-22 DIAGNOSIS — N390 Urinary tract infection, site not specified: Secondary | ICD-10-CM | POA: Diagnosis not present

## 2019-02-22 DIAGNOSIS — S52024D Nondisplaced fracture of olecranon process without intraarticular extension of right ulna, subsequent encounter for closed fracture with routine healing: Secondary | ICD-10-CM | POA: Diagnosis not present

## 2019-02-22 DIAGNOSIS — S72361D Displaced segmental fracture of shaft of right femur, subsequent encounter for closed fracture with routine healing: Secondary | ICD-10-CM | POA: Diagnosis not present

## 2019-02-22 DIAGNOSIS — U071 COVID-19: Secondary | ICD-10-CM | POA: Diagnosis not present

## 2019-02-22 DIAGNOSIS — R339 Retention of urine, unspecified: Secondary | ICD-10-CM | POA: Diagnosis not present

## 2019-02-22 DIAGNOSIS — D72829 Elevated white blood cell count, unspecified: Secondary | ICD-10-CM | POA: Diagnosis not present

## 2019-02-25 DIAGNOSIS — S72361D Displaced segmental fracture of shaft of right femur, subsequent encounter for closed fracture with routine healing: Secondary | ICD-10-CM | POA: Diagnosis not present

## 2019-02-25 DIAGNOSIS — S52024D Nondisplaced fracture of olecranon process without intraarticular extension of right ulna, subsequent encounter for closed fracture with routine healing: Secondary | ICD-10-CM | POA: Diagnosis not present

## 2019-02-25 DIAGNOSIS — D72829 Elevated white blood cell count, unspecified: Secondary | ICD-10-CM | POA: Diagnosis not present

## 2019-02-25 DIAGNOSIS — U071 COVID-19: Secondary | ICD-10-CM | POA: Diagnosis not present

## 2019-02-25 DIAGNOSIS — R339 Retention of urine, unspecified: Secondary | ICD-10-CM | POA: Diagnosis not present

## 2019-02-25 DIAGNOSIS — E46 Unspecified protein-calorie malnutrition: Secondary | ICD-10-CM | POA: Diagnosis not present

## 2019-02-25 DIAGNOSIS — N390 Urinary tract infection, site not specified: Secondary | ICD-10-CM | POA: Diagnosis not present

## 2019-02-25 DIAGNOSIS — R627 Adult failure to thrive: Secondary | ICD-10-CM | POA: Diagnosis not present

## 2019-03-04 DIAGNOSIS — S72361D Displaced segmental fracture of shaft of right femur, subsequent encounter for closed fracture with routine healing: Secondary | ICD-10-CM | POA: Diagnosis not present

## 2019-03-04 DIAGNOSIS — R339 Retention of urine, unspecified: Secondary | ICD-10-CM | POA: Diagnosis not present

## 2019-03-04 DIAGNOSIS — S52024D Nondisplaced fracture of olecranon process without intraarticular extension of right ulna, subsequent encounter for closed fracture with routine healing: Secondary | ICD-10-CM | POA: Diagnosis not present

## 2019-03-04 DIAGNOSIS — N390 Urinary tract infection, site not specified: Secondary | ICD-10-CM | POA: Diagnosis not present

## 2019-03-04 DIAGNOSIS — E46 Unspecified protein-calorie malnutrition: Secondary | ICD-10-CM | POA: Diagnosis not present

## 2019-03-04 DIAGNOSIS — U071 COVID-19: Secondary | ICD-10-CM | POA: Diagnosis not present

## 2019-03-08 DIAGNOSIS — D72829 Elevated white blood cell count, unspecified: Secondary | ICD-10-CM | POA: Diagnosis not present

## 2019-03-08 DIAGNOSIS — U071 COVID-19: Secondary | ICD-10-CM | POA: Diagnosis not present

## 2019-03-08 DIAGNOSIS — S72361D Displaced segmental fracture of shaft of right femur, subsequent encounter for closed fracture with routine healing: Secondary | ICD-10-CM | POA: Diagnosis not present

## 2019-03-08 DIAGNOSIS — R339 Retention of urine, unspecified: Secondary | ICD-10-CM | POA: Diagnosis not present

## 2019-03-08 DIAGNOSIS — N390 Urinary tract infection, site not specified: Secondary | ICD-10-CM | POA: Diagnosis not present

## 2019-03-08 DIAGNOSIS — E46 Unspecified protein-calorie malnutrition: Secondary | ICD-10-CM | POA: Diagnosis not present

## 2019-03-08 DIAGNOSIS — S52021D Displaced fracture of olecranon process without intraarticular extension of right ulna, subsequent encounter for closed fracture with routine healing: Secondary | ICD-10-CM | POA: Diagnosis not present

## 2019-03-08 DIAGNOSIS — S52024D Nondisplaced fracture of olecranon process without intraarticular extension of right ulna, subsequent encounter for closed fracture with routine healing: Secondary | ICD-10-CM | POA: Diagnosis not present

## 2019-03-08 DIAGNOSIS — S72141D Displaced intertrochanteric fracture of right femur, subsequent encounter for closed fracture with routine healing: Secondary | ICD-10-CM | POA: Diagnosis not present

## 2019-03-15 DIAGNOSIS — R339 Retention of urine, unspecified: Secondary | ICD-10-CM | POA: Diagnosis not present

## 2019-03-15 DIAGNOSIS — R2689 Other abnormalities of gait and mobility: Secondary | ICD-10-CM | POA: Diagnosis not present

## 2019-03-15 DIAGNOSIS — F0390 Unspecified dementia without behavioral disturbance: Secondary | ICD-10-CM | POA: Diagnosis not present

## 2019-03-15 DIAGNOSIS — E46 Unspecified protein-calorie malnutrition: Secondary | ICD-10-CM | POA: Diagnosis not present

## 2019-03-15 DIAGNOSIS — Z9181 History of falling: Secondary | ICD-10-CM | POA: Diagnosis not present

## 2019-03-23 ENCOUNTER — Encounter: Payer: Self-pay | Admitting: Physician Assistant

## 2019-03-24 ENCOUNTER — Ambulatory Visit: Payer: MEDICARE | Admitting: Internal Medicine

## 2019-03-31 ENCOUNTER — Telehealth: Payer: Self-pay | Admitting: Gastroenterology

## 2019-03-31 NOTE — Telephone Encounter (Signed)
Hi Dr. Ardis Hughs, we have received a referral from [facility] for pt to be seen for elevated LFTs. Dr. Vernell Morgans takes care of pt at the nursing home, his phone number is [phone].  Records will be sent to you for review. Thank you.

## 2019-04-12 DIAGNOSIS — S72141D Displaced intertrochanteric fracture of right femur, subsequent encounter for closed fracture with routine healing: Secondary | ICD-10-CM | POA: Diagnosis not present

## 2019-04-12 DIAGNOSIS — S52021D Displaced fracture of olecranon process without intraarticular extension of right ulna, subsequent encounter for closed fracture with routine healing: Secondary | ICD-10-CM | POA: Diagnosis not present

## 2019-04-14 ENCOUNTER — Emergency Department (HOSPITAL_COMMUNITY): Payer: MEDICARE

## 2019-04-14 ENCOUNTER — Other Ambulatory Visit: Payer: Self-pay

## 2019-04-14 ENCOUNTER — Inpatient Hospital Stay (HOSPITAL_COMMUNITY)
Admission: EM | Admit: 2019-04-14 | Discharge: 2019-04-18 | DRG: 963 | Disposition: A | Discharge status: Hospice | Payer: MEDICARE | Source: Skilled Nursing Facility | Attending: Internal Medicine | Admitting: Internal Medicine

## 2019-04-14 ENCOUNTER — Encounter (HOSPITAL_COMMUNITY): Payer: Self-pay | Admitting: Internal Medicine

## 2019-04-14 ENCOUNTER — Inpatient Hospital Stay (HOSPITAL_COMMUNITY): Payer: MEDICARE

## 2019-04-14 DIAGNOSIS — R404 Transient alteration of awareness: Secondary | ICD-10-CM | POA: Diagnosis not present

## 2019-04-14 DIAGNOSIS — S32591A Other specified fracture of right pubis, initial encounter for closed fracture: Secondary | ICD-10-CM | POA: Diagnosis present

## 2019-04-14 DIAGNOSIS — M2550 Pain in unspecified joint: Secondary | ICD-10-CM | POA: Diagnosis not present

## 2019-04-14 DIAGNOSIS — F0390 Unspecified dementia without behavioral disturbance: Secondary | ICD-10-CM | POA: Diagnosis present

## 2019-04-14 DIAGNOSIS — S329XXA Fracture of unspecified parts of lumbosacral spine and pelvis, initial encounter for closed fracture: Secondary | ICD-10-CM | POA: Diagnosis present

## 2019-04-14 DIAGNOSIS — R159 Full incontinence of feces: Secondary | ICD-10-CM | POA: Diagnosis not present

## 2019-04-14 DIAGNOSIS — E7800 Pure hypercholesterolemia, unspecified: Secondary | ICD-10-CM | POA: Diagnosis present

## 2019-04-14 DIAGNOSIS — S0240EA Zygomatic fracture, right side, initial encounter for closed fracture: Secondary | ICD-10-CM | POA: Diagnosis present

## 2019-04-14 DIAGNOSIS — Y92129 Unspecified place in nursing home as the place of occurrence of the external cause: Secondary | ICD-10-CM | POA: Diagnosis not present

## 2019-04-14 DIAGNOSIS — S32401A Unspecified fracture of right acetabulum, initial encounter for closed fracture: Secondary | ICD-10-CM | POA: Diagnosis not present

## 2019-04-14 DIAGNOSIS — T1490XA Injury, unspecified, initial encounter: Secondary | ICD-10-CM | POA: Diagnosis not present

## 2019-04-14 DIAGNOSIS — R627 Adult failure to thrive: Secondary | ICD-10-CM | POA: Diagnosis present

## 2019-04-14 DIAGNOSIS — Z741 Need for assistance with personal care: Secondary | ICD-10-CM | POA: Diagnosis not present

## 2019-04-14 DIAGNOSIS — W07XXXA Fall from chair, initial encounter: Secondary | ICD-10-CM | POA: Diagnosis present

## 2019-04-14 DIAGNOSIS — S065X9A Traumatic subdural hemorrhage with loss of consciousness of unspecified duration, initial encounter: Principal | ICD-10-CM

## 2019-04-14 DIAGNOSIS — L89612 Pressure ulcer of right heel, stage 2: Secondary | ICD-10-CM | POA: Diagnosis present

## 2019-04-14 DIAGNOSIS — J439 Emphysema, unspecified: Secondary | ICD-10-CM | POA: Diagnosis present

## 2019-04-14 DIAGNOSIS — R64 Cachexia: Secondary | ICD-10-CM | POA: Diagnosis present

## 2019-04-14 DIAGNOSIS — Z87442 Personal history of urinary calculi: Secondary | ICD-10-CM

## 2019-04-14 DIAGNOSIS — R32 Unspecified urinary incontinence: Secondary | ICD-10-CM | POA: Diagnosis not present

## 2019-04-14 DIAGNOSIS — S32471A Displaced fracture of medial wall of right acetabulum, initial encounter for closed fracture: Secondary | ICD-10-CM | POA: Diagnosis not present

## 2019-04-14 DIAGNOSIS — S299XXA Unspecified injury of thorax, initial encounter: Secondary | ICD-10-CM | POA: Diagnosis not present

## 2019-04-14 DIAGNOSIS — D649 Anemia, unspecified: Secondary | ICD-10-CM | POA: Diagnosis not present

## 2019-04-14 DIAGNOSIS — R296 Repeated falls: Secondary | ICD-10-CM | POA: Diagnosis present

## 2019-04-14 DIAGNOSIS — Z87891 Personal history of nicotine dependence: Secondary | ICD-10-CM

## 2019-04-14 DIAGNOSIS — S0231XA Fracture of orbital floor, right side, initial encounter for closed fracture: Secondary | ICD-10-CM | POA: Diagnosis present

## 2019-04-14 DIAGNOSIS — Z20822 Contact with and (suspected) exposure to covid-19: Secondary | ICD-10-CM | POA: Diagnosis present

## 2019-04-14 DIAGNOSIS — J189 Pneumonia, unspecified organism: Secondary | ICD-10-CM | POA: Diagnosis present

## 2019-04-14 DIAGNOSIS — Z8679 Personal history of other diseases of the circulatory system: Secondary | ICD-10-CM | POA: Diagnosis not present

## 2019-04-14 DIAGNOSIS — Z515 Encounter for palliative care: Secondary | ICD-10-CM | POA: Diagnosis not present

## 2019-04-14 DIAGNOSIS — S32501A Unspecified fracture of right pubis, initial encounter for closed fracture: Secondary | ICD-10-CM | POA: Diagnosis not present

## 2019-04-14 DIAGNOSIS — S0292XD Unspecified fracture of facial bones, subsequent encounter for fracture with routine healing: Secondary | ICD-10-CM | POA: Diagnosis not present

## 2019-04-14 DIAGNOSIS — S0281XA Fracture of other specified skull and facial bones, right side, initial encounter for closed fracture: Secondary | ICD-10-CM | POA: Diagnosis not present

## 2019-04-14 DIAGNOSIS — S0292XA Unspecified fracture of facial bones, initial encounter for closed fracture: Secondary | ICD-10-CM

## 2019-04-14 DIAGNOSIS — R5381 Other malaise: Secondary | ICD-10-CM | POA: Diagnosis not present

## 2019-04-14 DIAGNOSIS — S065X0A Traumatic subdural hemorrhage without loss of consciousness, initial encounter: Secondary | ICD-10-CM | POA: Diagnosis not present

## 2019-04-14 DIAGNOSIS — S065X9D Traumatic subdural hemorrhage with loss of consciousness of unspecified duration, subsequent encounter: Secondary | ICD-10-CM | POA: Diagnosis not present

## 2019-04-14 DIAGNOSIS — R269 Unspecified abnormalities of gait and mobility: Secondary | ICD-10-CM | POA: Diagnosis not present

## 2019-04-14 DIAGNOSIS — M545 Low back pain: Secondary | ICD-10-CM | POA: Diagnosis not present

## 2019-04-14 DIAGNOSIS — S065XAA Traumatic subdural hemorrhage with loss of consciousness status unknown, initial encounter: Secondary | ICD-10-CM | POA: Diagnosis present

## 2019-04-14 DIAGNOSIS — Z79899 Other long term (current) drug therapy: Secondary | ICD-10-CM

## 2019-04-14 DIAGNOSIS — E785 Hyperlipidemia, unspecified: Secondary | ICD-10-CM | POA: Diagnosis present

## 2019-04-14 DIAGNOSIS — Z743 Need for continuous supervision: Secondary | ICD-10-CM | POA: Diagnosis not present

## 2019-04-14 DIAGNOSIS — S02841A Fracture of lateral orbital wall, right side, initial encounter for closed fracture: Secondary | ICD-10-CM | POA: Diagnosis present

## 2019-04-14 DIAGNOSIS — W19XXXA Unspecified fall, initial encounter: Secondary | ICD-10-CM | POA: Diagnosis not present

## 2019-04-14 DIAGNOSIS — S329XXD Fracture of unspecified parts of lumbosacral spine and pelvis, subsequent encounter for fracture with routine healing: Secondary | ICD-10-CM | POA: Diagnosis not present

## 2019-04-14 DIAGNOSIS — S32401D Unspecified fracture of right acetabulum, subsequent encounter for fracture with routine healing: Secondary | ICD-10-CM | POA: Diagnosis not present

## 2019-04-14 DIAGNOSIS — Z66 Do not resuscitate: Secondary | ICD-10-CM | POA: Diagnosis present

## 2019-04-14 DIAGNOSIS — S0181XA Laceration without foreign body of other part of head, initial encounter: Secondary | ICD-10-CM

## 2019-04-14 DIAGNOSIS — Z681 Body mass index (BMI) 19 or less, adult: Secondary | ICD-10-CM | POA: Diagnosis not present

## 2019-04-14 DIAGNOSIS — R52 Pain, unspecified: Secondary | ICD-10-CM | POA: Diagnosis not present

## 2019-04-14 DIAGNOSIS — S32810A Multiple fractures of pelvis with stable disruption of pelvic ring, initial encounter for closed fracture: Secondary | ICD-10-CM | POA: Diagnosis not present

## 2019-04-14 DIAGNOSIS — S3992XA Unspecified injury of lower back, initial encounter: Secondary | ICD-10-CM | POA: Diagnosis not present

## 2019-04-14 DIAGNOSIS — N9489 Other specified conditions associated with female genital organs and menstrual cycle: Secondary | ICD-10-CM

## 2019-04-14 DIAGNOSIS — Z9181 History of falling: Secondary | ICD-10-CM | POA: Diagnosis not present

## 2019-04-14 DIAGNOSIS — Z8673 Personal history of transient ischemic attack (TIA), and cerebral infarction without residual deficits: Secondary | ICD-10-CM | POA: Diagnosis not present

## 2019-04-14 DIAGNOSIS — Z7401 Bed confinement status: Secondary | ICD-10-CM | POA: Diagnosis not present

## 2019-04-14 DIAGNOSIS — S32409A Unspecified fracture of unspecified acetabulum, initial encounter for closed fracture: Secondary | ICD-10-CM | POA: Diagnosis not present

## 2019-04-14 DIAGNOSIS — S0231XD Fracture of orbital floor, right side, subsequent encounter for fracture with routine healing: Secondary | ICD-10-CM | POA: Diagnosis not present

## 2019-04-14 DIAGNOSIS — E46 Unspecified protein-calorie malnutrition: Secondary | ICD-10-CM | POA: Diagnosis present

## 2019-04-14 DIAGNOSIS — S32421A Displaced fracture of posterior wall of right acetabulum, initial encounter for closed fracture: Secondary | ICD-10-CM | POA: Diagnosis not present

## 2019-04-14 DIAGNOSIS — E43 Unspecified severe protein-calorie malnutrition: Secondary | ICD-10-CM | POA: Diagnosis not present

## 2019-04-14 DIAGNOSIS — S199XXA Unspecified injury of neck, initial encounter: Secondary | ICD-10-CM | POA: Diagnosis not present

## 2019-04-14 DIAGNOSIS — S32511A Fracture of superior rim of right pubis, initial encounter for closed fracture: Secondary | ICD-10-CM | POA: Diagnosis not present

## 2019-04-14 DIAGNOSIS — Z8781 Personal history of (healed) traumatic fracture: Secondary | ICD-10-CM | POA: Diagnosis not present

## 2019-04-14 DIAGNOSIS — S0990XA Unspecified injury of head, initial encounter: Secondary | ICD-10-CM | POA: Diagnosis not present

## 2019-04-14 DIAGNOSIS — L8990 Pressure ulcer of unspecified site, unspecified stage: Secondary | ICD-10-CM | POA: Insufficient documentation

## 2019-04-14 LAB — CBC WITH DIFFERENTIAL/PLATELET
Abs Immature Granulocytes: 0.08 10*3/uL — ABNORMAL HIGH (ref 0.00–0.07)
Abs Immature Granulocytes: 0.11 10*3/uL — ABNORMAL HIGH (ref 0.00–0.07)
Basophils Absolute: 0 10*3/uL (ref 0.0–0.1)
Basophils Absolute: 0 10*3/uL (ref 0.0–0.1)
Basophils Relative: 0 %
Basophils Relative: 0 %
Eosinophils Absolute: 0.1 10*3/uL (ref 0.0–0.5)
Eosinophils Absolute: 0 10*3/uL (ref 0.0–0.5)
Eosinophils Relative: 1 %
Eosinophils Relative: 0 %
HCT: 42.1 % (ref 36.0–46.0)
HCT: 40.7 % (ref 36.0–46.0)
Hemoglobin: 13 g/dL (ref 12.0–15.0)
Hemoglobin: 12.7 g/dL (ref 12.0–15.0)
Immature Granulocytes: 1 %
Immature Granulocytes: 1 %
Lymphocytes Relative: 20 %
Lymphocytes Relative: 5 %
Lymphs Abs: 1.8 10*3/uL (ref 0.7–4.0)
Lymphs Abs: 0.9 10*3/uL (ref 0.7–4.0)
MCH: 30.7 pg (ref 26.0–34.0)
MCH: 30.7 pg (ref 26.0–34.0)
MCHC: 30.9 g/dL (ref 30.0–36.0)
MCHC: 31.2 g/dL (ref 30.0–36.0)
MCV: 99.5 fL (ref 80.0–100.0)
MCV: 98.3 fL (ref 80.0–100.0)
Monocytes Absolute: 0.8 10*3/uL (ref 0.1–1.0)
Monocytes Absolute: 1.5 10*3/uL — ABNORMAL HIGH (ref 0.1–1.0)
Monocytes Relative: 8 %
Monocytes Relative: 8 %
Neutro Abs: 6.3 10*3/uL (ref 1.7–7.7)
Neutro Abs: 15.4 10*3/uL — ABNORMAL HIGH (ref 1.7–7.7)
Neutrophils Relative %: 70 %
Neutrophils Relative %: 86 %
Platelets: 408 10*3/uL — ABNORMAL HIGH (ref 150–400)
Platelets: 382 10*3/uL (ref 150–400)
RBC: 4.23 MIL/uL (ref 3.87–5.11)
RBC: 4.14 MIL/uL (ref 3.87–5.11)
RDW: 13.6 % (ref 11.5–15.5)
RDW: 13.4 % (ref 11.5–15.5)
WBC: 9 10*3/uL (ref 4.0–10.5)
WBC: 17.9 10*3/uL — ABNORMAL HIGH (ref 4.0–10.5)
nRBC: 0 % (ref 0.0–0.2)
nRBC: 0 % (ref 0.0–0.2)

## 2019-04-14 LAB — BASIC METABOLIC PANEL
Anion gap: 9 (ref 5–15)
BUN: 13 mg/dL (ref 8–23)
CO2: 25 mmol/L (ref 22–32)
Calcium: 8.9 mg/dL (ref 8.9–10.3)
Chloride: 106 mmol/L (ref 98–111)
Creatinine, Ser: 0.59 mg/dL (ref 0.44–1.00)
GFR calc Af Amer: >60 mL/min (ref >60)
GFR calc non Af Amer: >60 mL/min (ref >60)
Glucose, Bld: 115 mg/dL — ABNORMAL HIGH (ref 70–99)
Potassium: 4.1 mmol/L (ref 3.5–5.1)
Sodium: 140 mmol/L (ref 135–145)

## 2019-04-14 LAB — TROPONIN I (HIGH SENSITIVITY)
Troponin I (High Sensitivity): 4 ng/L (ref <18)
Troponin I (High Sensitivity): 5 ng/L (ref <18)

## 2019-04-14 LAB — CBG MONITORING, ED
Glucose-Capillary: 105 mg/dL — ABNORMAL HIGH (ref 70–99)
Glucose-Capillary: 147 mg/dL — ABNORMAL HIGH (ref 70–99)

## 2019-04-14 MED ORDER — FENTANYL CITRATE (PF) 100 MCG/2ML IJ SOLN
50.0000 ug | Freq: Once | INTRAMUSCULAR | Status: AC
  Administered 2019-04-14: 50 ug via INTRAVENOUS
  Filled 2019-04-14: qty 2

## 2019-04-14 MED ORDER — LORAZEPAM 2 MG/ML IJ SOLN: 1.0000 mg | Freq: Once | INTRAMUSCULAR | Status: DC

## 2019-04-14 MED ORDER — DEXTROSE-NACL 5-0.9 % IV SOLN: INTRAVENOUS | Status: AC

## 2019-04-14 MED ORDER — FENTANYL CITRATE (PF) 100 MCG/2ML IJ SOLN
25.0000 ug | INTRAMUSCULAR | Status: DC | PRN
  Administered 2019-04-14 – 2019-04-15 (×2): 25 ug via INTRAVENOUS
  Filled 2019-04-14: qty 2

## 2019-04-14 MED ORDER — ONDANSETRON HCL 4 MG/2ML IJ SOLN
4.0000 mg | Freq: Four times a day (QID) | INTRAMUSCULAR | Status: DC | PRN
  Administered 2019-04-14: 4 mg via INTRAVENOUS
  Filled 2019-04-14: qty 2

## 2019-04-14 MED ORDER — SODIUM CHLORIDE 0.9 % IV SOLN
500.0000 mg | Freq: Once | INTRAVENOUS | Status: AC
  Administered 2019-04-14: 500 mg via INTRAVENOUS
  Filled 2019-04-14: qty 500

## 2019-04-14 MED ORDER — LORAZEPAM 2 MG/ML IJ SOLN
1.0000 mg | Freq: Once | INTRAMUSCULAR | Status: AC
  Administered 2019-04-14: 1 mg via INTRAMUSCULAR
  Filled 2019-04-14: qty 1

## 2019-04-14 MED ORDER — SODIUM CHLORIDE 0.9 % IV SOLN
1.0000 g | Freq: Once | INTRAVENOUS | Status: AC
  Administered 2019-04-14: 1 g via INTRAVENOUS
  Filled 2019-04-14: qty 10

## 2019-04-14 MED ORDER — FENTANYL CITRATE (PF) 100 MCG/2ML IJ SOLN
25.0000 ug | INTRAMUSCULAR | Status: DC | PRN
  Filled 2019-04-14: qty 2

## 2019-04-14 MED ORDER — ONDANSETRON HCL 4 MG PO TABS: 4.0000 mg | ORAL_TABLET | Freq: Four times a day (QID) | ORAL | Status: DC | PRN

## 2019-04-14 NOTE — ED Notes (Signed)
Provider made aware of patients [facility] and [facility]

## 2019-04-14 NOTE — ED Notes (Signed)
C-Collar removed with Providers Approval

## 2019-04-14 NOTE — H&P (Signed)
History and Physical    RUSSELL TEANEY [id number] DOB: [birth year] DOA: 04/14/2019  PCP: [name], MD  Patient coming from: Skilled nursing facility.  Chief Complaint: Unwitnessed fall.  HPI: [name] is a 85 y.o. female with history of advanced dementia who was admitted in January 2021 for right femur fracture underwent intramedullary nailing had a unwitnessed fall at the nursing facility and was brought to the ER.  As per the report patient was sitting on a chair and few minutes later was found on the floor.  Had right periorbital hematoma.  ED Course: In the ER patient had CT scans done which showed right facial fracture right subdural hematoma right acetabular and pubic fracture.  On-call note orthopedic surgeon Dr. Griffin Basil was called for the pelvic fracture advised nonweightbearing and for the subdural hematoma neurosurgery has been called and they requested no surgical intervention.  And for the right facial fracture Dr. Marla Roe has been consulted.  There was some concern for possible pneumonia for which patient was started on antibiotics.  Patient is a DNR which was confirmed by ER physician with patient's daughter.  EKG shows sinus tachycardia.  Covid test was negative.   Review of Systems: As per HPI, rest all negative.   Past Medical History:  Diagnosis Date  . Double vision    started 1st part of December 2013-Has  a Cerebral Aneurysm  . High cholesterol   . Hyperlipidemia   . Kidney stone     Past Surgical History:  Procedure Laterality Date  . BACK SURGERY    . CRANIOTOMY  01/23/2012   Procedure: CRANIOTOMY INTRACRANIAL ANEURYSM FOR CAROTID;  Surgeon: Winfield Cunas, MD;  Location: [facility];  Service: Neurosurgery;  Laterality: Left;  Left Pterional Craniotomy for aneurysm clipping  . INTRAMEDULLARY (IM) NAIL INTERTROCHANTERIC Right 02/04/2019   Procedure: INTRAMEDULLARY (IM) NAIL INTERTROCHANTRIC;  Surgeon: Hiram Gash, MD;  Location: [facility];   Service: Orthopedics;  Laterality: Right;  . Left wrist     fracture 02/2011  . ORIF ELBOW FRACTURE Right 02/09/2019   Procedure: OPEN REDUCTION INTERNAL FIXATION (ORIF) ELBOW/OLECRANON FRACTURE;  Surgeon: Hiram Gash, MD;  Location: [facility];  Service: Orthopedics;  Laterality: Right;  . TUBAL LIGATION       reports that she has never smoked. She has never used smokeless tobacco. She reports that she does not drink alcohol or use drugs.  No Known Allergies  Family History  Family history unknown: Yes    Prior to Admission medications   Medication Sig Start Date End Date Taking? Authorizing Provider  acetaminophen (TYLENOL) 325 MG tablet Take 2 tablets (650 mg total) by mouth every 6 (six) hours as needed. Patient taking differently: Take 650 mg by mouth every 6 (six) hours as needed (for pain- NOT TO EXCEED 4,000 MG/24 HOURS FROM ALL COMBINED SOURCES).  02/14/19  Yes Ghimire, Henreitta Leber, MD  Amino Acids-Protein Hydrolys (FEEDING SUPPLEMENT, PRO-STAT SUGAR FREE 64,) LIQD Take 30 mLs by mouth in the morning.   Yes [provider]  ascorbic acid (VITAMIN C) 500 MG tablet Take 500 mg by mouth 2 (two) times daily.   Yes [provider]  Cholecalciferol (VITAMIN D-3) 25 MCG (1000 UT) CAPS Take 1,000 Units by mouth daily.   Yes [provider]  ferrous sulfate 325 (65 FE) MG tablet Take 1 tablet (325 mg total) by mouth 2 (two) times daily with a meal. 02/14/19  Yes Ghimire, Henreitta Leber, MD  Nutritional  Supplements (RESOURCE 2.0) LIQD Take 120 mLs by mouth 3 (three) times daily.   Yes [provider]  oxyCODONE (OXY IR/ROXICODONE) 5 MG immediate release tablet Take 1 pills every 4-6 hrs as needed for pain Patient taking differently: Take 5 mg by mouth every 4 (four) hours as needed (for pain).  02/11/19  Yes McBane, Maylene Roes, PA-C  polyethylene glycol (MIRALAX / GLYCOLAX) 17 g packet Take 17 g by mouth daily as needed for mild constipation. Patient taking differently:  Take 17 g by mouth daily as needed for mild constipation (MIX INTO 4-8 OUNCES OF LIQUID WHEN MIXING).  02/14/19  Yes Ghimire, Henreitta Leber, MD  senna-docusate (SENOKOT-S) 8.6-50 MG tablet Take 2 tablets by mouth 2 (two) times daily. 02/14/19  Yes Ghimire, Henreitta Leber, MD  tamsulosin (FLOMAX) 0.4 MG CAPS capsule Take 1 capsule (0.4 mg total) by mouth daily. 02/15/19  Yes Ghimire, Henreitta Leber, MD  zinc sulfate 220 (50 Zn) MG capsule Take 220 mg by mouth daily.   Yes [provider]  cholecalciferol (VITAMIN D) 25 MCG tablet Take 1 tablet (1,000 Units total) by mouth daily. Patient not taking: Reported on 04/14/2019 02/15/19   Jonetta Osgood, MD  enoxaparin (LOVENOX) 40 MG/0.4ML injection Inject 0.4 mLs (40 mg total) into the skin daily. Patient not taking: Reported on 04/14/2019 02/11/19 04/14/19  Ethelda Chick, PA-C  feeding supplement, ENSURE ENLIVE, (ENSURE ENLIVE) LIQD Take 237 mLs by mouth 3 (three) times daily between meals. Patient not taking: Reported on 04/14/2019 02/14/19   Jonetta Osgood, MD    Physical Exam: Constitutional: Moderately built and nourished. Vitals:   04/14/19 2000 04/14/19 2130 04/14/19 2145 04/14/19 2200  BP: 122/70 132/75  138/86  Pulse: (!) 108 (!) 104 (!) 107 (!) 107  Resp: 15 (!) 24 (!) 21 (!) 21  Temp:      TempSrc:      SpO2: 95% 94% 94% 95%   Eyes: Right periorbital hematoma. ENMT: No discharge from the ears eyes nose or mouth. Neck: No mass felt.  No neck rigidity. Respiratory: No rhonchi or crepitations. Cardiovascular: S1-S2 heard. Abdomen: Soft nontender bowel sounds present. Musculoskeletal: Pain on my extremities. Skin: Right periorbital hematoma. Neurologic: Patient is very sedated. Psychiatric: Mildly sedated.   Labs on Admission: I have personally reviewed following labs and imaging studies  CBC: Recent Labs  Lab 04/14/19 1633 04/14/19 1938  WBC 9.0 17.9*  NEUTROABS 6.3 15.4*  HGB 13.0 12.7  HCT 42.1 40.7  MCV 99.5 98.3  PLT 408* [id number]    Basic Metabolic Panel: Recent Labs  Lab 04/14/19 1633  NA 140  K 4.1  CL 106  CO2 25  GLUCOSE 115*  BUN 13  CREATININE 0.59  CALCIUM 8.9   GFR: CrCl cannot be calculated (Unknown ideal weight.). Liver Function Tests: No results for input(s): AST, ALT, ALKPHOS, BILITOT, PROT, ALBUMIN in the last 168 hours. No results for input(s): LIPASE, AMYLASE in the last 168 hours. No results for input(s): AMMONIA in the last 168 hours. Coagulation Profile: No results for input(s): INR, PROTIME in the last 168 hours. Cardiac Enzymes: No results for input(s): CKTOTAL, CKMB, CKMBINDEX, TROPONINI in the last 168 hours. BNP (last 3 results) No results for input(s): PROBNP in the last 8760 hours. HbA1C: No results for input(s): HGBA1C in the last 72 hours. CBG: Recent Labs  Lab 04/14/19 1626  GLUCAP 105*   Lipid Profile: No results for input(s): CHOL, HDL, LDLCALC, TRIG, CHOLHDL, LDLDIRECT in the last  72 hours. Thyroid Function Tests: No results for input(s): TSH, T4TOTAL, FREET4, T3FREE, THYROIDAB in the last 72 hours. Anemia Panel: No results for input(s): VITAMINB12, FOLATE, FERRITIN, TIBC, IRON, RETICCTPCT in the last 72 hours. Urine analysis:    Component Value Date/Time   COLORURINE YELLOW 02/03/2019 1255   APPEARANCEUR HAZY (A) 02/03/2019 1255   LABSPEC 1.016 02/03/2019 1255   PHURINE 8.0 02/03/2019 1255   GLUCOSEU NEGATIVE 02/03/2019 1255   HGBUR NEGATIVE 02/03/2019 [place] 02/03/2019 [place] 02/03/2019 1255   PROTEINUR NEGATIVE 02/03/2019 1255   UROBILINOGEN 0.2 07/27/2007 1016   NITRITE NEGATIVE 02/03/2019 1255   LEUKOCYTESUR NEGATIVE 02/03/2019 1255   Sepsis Labs: @LABRCNTIP (procalcitonin:4,lacticidven:4) )No results found for this or any previous visit (from the past 240 hour(s)).   Radiological Exams on Admission: CT ABDOMEN PELVIS WO CONTRAST  Result Date: 04/14/2019 CLINICAL DATA:  Acute pain due to trauma EXAM: CT  CHEST, ABDOMEN AND PELVIS WITHOUT CONTRAST TECHNIQUE: Multidetector CT imaging of the chest, abdomen and pelvis was performed following the standard protocol without IV contrast. COMPARISON:  CT pelvis from the same day. FINDINGS: CT CHEST FINDINGS Cardiovascular: The heart size is essentially normal. There is no evidence for thoracic aortic aneurysm. Coronary artery calcifications are noted. There is no significant pericardial effusion. Mediastinum/Nodes: --No mediastinal or hilar lymphadenopathy. --No axillary lymphadenopathy. --No supraclavicular lymphadenopathy. --Normal thyroid gland. --The esophagus is unremarkable Lungs/Pleura: There is a 1 cm ground-glass airspace opacity in the right upper lobe (axial series 6, image 26). There is no pneumothorax or large focal infiltrate. There is some atelectasis at the lung bases, right worse than left. Musculoskeletal: There are old bilateral right-sided rib fractures. There is no acute displaced fracture. There is sclerosis of the superior endplate of the T5 vertebral body. There is mild height loss of the T5 vertebral body. There are chronic appearing compression fractures of the T11 and T7 vertebral bodies. CT ABDOMEN PELVIS FINDINGS Hepatobiliary: The liver is normal. Normal gallbladder.There is no biliary ductal dilation. Pancreas: Normal contours without ductal dilatation. No peripancreatic fluid collection. Spleen: Unremarkable. Adrenals/Urinary Tract: --Adrenal glands: Unremarkable. --Right kidney/ureter: There are punctate nonobstructing stones in the lower pole the right kidney. --Left kidney/ureter: No hydronephrosis or radiopaque kidney stones. --Urinary bladder: Unremarkable. Stomach/Bowel: --Stomach/Duodenum: No hiatal hernia or other gastric abnormality. Normal duodenal course and caliber. --Small bowel: Unremarkable. --Colon: Rectosigmoid diverticulosis without acute inflammation. --Appendix: Not visualized. No right lower quadrant inflammation or free  fluid. Vascular/Lymphatic: Atherosclerotic calcification is present within the non-aneurysmal abdominal aorta, without hemodynamically significant stenosis. --No retroperitoneal lymphadenopathy. --No mesenteric lymphadenopathy. --No pelvic or inguinal lymphadenopathy. Reproductive: Unremarkable Other: No ascites or free air. The abdominal wall is normal. Musculoskeletal. Comminuted fractures again noted of the right acetabulum involving the anterior and posterior columns. There is a moderate-sized pelvic hematoma. There is a probable acute nondisplaced fracture of the inferior pubic ramus on the right. There is a chronic appearing there is a chronic appearing compression fracture of the L5 vertebral body. IMPRESSION: 1. No acute traumatic abnormality detected involving the patient's thorax. 2. Again noted are acute pelvic fractures as previously described with a moderate-sized pelvic hematoma. 3. There is an age-indeterminate compression fracture of the superior endplate of the T5 vertebral body. Correlation with point tenderness is recommended. There are additional chronic appearing thoracic spine compression fractures. 4. There is a 1 cm ground-glass airspace opacity in the right upper lobe. Initial follow-up with CT at 6-12 months is recommended to confirm persistence. If  persistent, repeat CT is recommended every 2 years until 5 years of stability has been established. This recommendation follows the consensus statement: Guidelines for Management of Incidental Pulmonary Nodules Detected on CT Images: From the Fleischner Society 2017; Radiology 2017; 284:228-243. 5. Rectosigmoid diverticulosis without acute inflammation. 6. Nonobstructive right nephrolithiasis. Aortic Atherosclerosis (ICD10-I70.0). Electronically Signed   By: Constance Holster M.D.   On: 04/14/2019 21:22   DG Chest 1 View  Result Date: 04/14/2019 CLINICAL DATA:  Fall. EXAM: CHEST  1 VIEW COMPARISON:  02/04/2019 FINDINGS: Lungs are  hyperexpanded. Interstitial markings are diffusely coarsened with chronic features. Patchy opacity identified over the peripheral right upper lung. No dense focal airspace consolidation or pleural effusion. The cardiopericardial silhouette is within normal limits for size. Skin folds overlie the left hemithorax. Nodular density/densities projecting over the right parahilar lung is compatible with pad for telemetry leads. Bones are diffusely demineralized. IMPRESSION: 1. Hyperexpansion suggests emphysema. 2. Subtle ill-defined nodular density peripheral right upper lobe. This may be infectious/inflammatory. Follow-up imaging recommended to ensure resolution. Electronically Signed   By: Misty Stanley M.D.   On: 04/14/2019 17:40   DG Lumbar Spine Complete  Result Date: 04/14/2019 CLINICAL DATA:  Fall, pain EXAM: LUMBAR SPINE - COMPLETE 4+ VIEW COMPARISON:  Two-view chest x-ray 02/03/2019. FINDINGS: Diffuse osteopenia. Mild compression deformity at L1 and T11 and T10. The T10 fracture appears stable when compared to prior chest x-ray. The other levels are not well visualized on the prior chest x-ray. No malalignment. Diffuse degenerative disc and facet disease. IMPRESSION: Age-indeterminate mild compression fractures at T11 and L1. Chronic compression fracture at T10. Diffuse osteopenia. Electronically Signed   By: Rolm Baptise M.D.   On: 04/14/2019 17:43   CT Head Wo Contrast  Result Date: 04/14/2019 CLINICAL DATA:  85 year old female with head trauma. EXAM: CT HEAD WITHOUT CONTRAST CT MAXILLOFACIAL WITHOUT CONTRAST CT CERVICAL SPINE WITHOUT CONTRAST TECHNIQUE: Multidetector CT imaging of the head, cervical spine, and maxillofacial structures were performed using the standard protocol without intravenous contrast. Multiplanar CT image reconstructions of the cervical spine and maxillofacial structures were also generated. COMPARISON:  Head CT dated 02/03/2019. FINDINGS: CT HEAD FINDINGS Brain: Mild age-related  atrophy and chronic microvascular ischemic changes. Subcentimeter bilateral basal ganglia old lacunar infarcts. There is a small right temporoparietal subdural hemorrhage measuring up to 3 mm in thickness. Tiny linear high attenuation along the right frontal cortex (26/5, 19/7) may be artifactual or represent focal cortical contusion. No mass effect or midline shift. Vascular: No hyperdense vessel or unexpected calcification. 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 Skull: No acute calvarial pathology. Prior left temporal craniotomy. Other: Small right periorbital contusion. CT MAXILLOFACIAL FINDINGS Osseous: There is a depressed angulated fracture of the right zygomatic arch. Mildly angulated fractures of the posterolateral wall of the right maxillary sinus as well as nondisplaced fracture of the anterior wall of the right maxillary sinus. There is mild sutural diastasis of the lateral wall of the right orbit. There is a fracture of the right orbital floor. No other acute fracture. No mandibular dislocation. Orbits: The globes and retro-orbital fat are preserved. Sinuses: There is complete opacification of the right maxillary sinus consistent with hemosinus. No air-fluid level. The mastoid air cells are clear. Soft tissues: Mild right facial contusion. CT CERVICAL SPINE FINDINGS Alignment: No acute subluxation. Skull base and vertebrae: No acute fracture. Osteopenia. Soft tissues and spinal canal: No prevertebral fluid or swelling. No visible canal hematoma. Disc levels: Degenerative changes primarily at C5-C6 with  disc space narrowing and bone spurring. There is grade 1 C4-C5 anterolisthesis. Upper chest: Negative. Other: None IMPRESSION: 1. Small right temporoparietal subdural hemorrhage. No mass effect or midline shift. 2. No acute/traumatic cervical spine pathology. 3. Right facial bone fractures as above. These results were called by telephone at the time of interpretation on 04/14/2019 at 6:07 pm  to provider [facility] , who verbally acknowledged these results. Electronically Signed   By: Anner Crete M.D.   On: 04/14/2019 18:23   CT Chest Wo Contrast  Result Date: 04/14/2019 CLINICAL DATA:  Acute pain due to trauma EXAM: CT CHEST, ABDOMEN AND PELVIS WITHOUT CONTRAST TECHNIQUE: Multidetector CT imaging of the chest, abdomen and pelvis was performed following the standard protocol without IV contrast. COMPARISON:  CT pelvis from the same day. FINDINGS: CT CHEST FINDINGS Cardiovascular: The heart size is essentially normal. There is no evidence for thoracic aortic aneurysm. Coronary artery calcifications are noted. There is no significant pericardial effusion. Mediastinum/Nodes: --No mediastinal or hilar lymphadenopathy. --No axillary lymphadenopathy. --No supraclavicular lymphadenopathy. --Normal thyroid gland. --The esophagus is unremarkable Lungs/Pleura: There is a 1 cm ground-glass airspace opacity in the right upper lobe (axial series 6, image 26). There is no pneumothorax or large focal infiltrate. There is some atelectasis at the lung bases, right worse than left. Musculoskeletal: There are old bilateral right-sided rib fractures. There is no acute displaced fracture. There is sclerosis of the superior endplate of the T5 vertebral body. There is mild height loss of the T5 vertebral body. There are chronic appearing compression fractures of the T11 and T7 vertebral bodies. CT ABDOMEN PELVIS FINDINGS Hepatobiliary: The liver is normal. Normal gallbladder.There is no biliary ductal dilation. Pancreas: Normal contours without ductal dilatation. No peripancreatic fluid collection. Spleen: Unremarkable. Adrenals/Urinary Tract: --Adrenal glands: Unremarkable. --Right kidney/ureter: There are punctate nonobstructing stones in the lower pole the right kidney. --Left kidney/ureter: No hydronephrosis or radiopaque kidney stones. --Urinary bladder: Unremarkable. Stomach/Bowel: --Stomach/Duodenum: No hiatal  hernia or other gastric abnormality. Normal duodenal course and caliber. --Small bowel: Unremarkable. --Colon: Rectosigmoid diverticulosis without acute inflammation. --Appendix: Not visualized. No right lower quadrant inflammation or free fluid. Vascular/Lymphatic: Atherosclerotic calcification is present within the non-aneurysmal abdominal aorta, without hemodynamically significant stenosis. --No retroperitoneal lymphadenopathy. --No mesenteric lymphadenopathy. --No pelvic or inguinal lymphadenopathy. Reproductive: Unremarkable Other: No ascites or free air. The abdominal wall is normal. Musculoskeletal. Comminuted fractures again noted of the right acetabulum involving the anterior and posterior columns. There is a moderate-sized pelvic hematoma. There is a probable acute nondisplaced fracture of the inferior pubic ramus on the right. There is a chronic appearing there is a chronic appearing compression fracture of the L5 vertebral body. IMPRESSION: 1. No acute traumatic abnormality detected involving the patient's thorax. 2. Again noted are acute pelvic fractures as previously described with a moderate-sized pelvic hematoma. 3. There is an age-indeterminate compression fracture of the superior endplate of the T5 vertebral body. Correlation with point tenderness is recommended. There are additional chronic appearing thoracic spine compression fractures. 4. There is a 1 cm ground-glass airspace opacity in the right upper lobe. Initial follow-up with CT at 6-12 months is recommended to confirm persistence. If persistent, repeat CT is recommended every 2 years until 5 years of stability has been established. This recommendation follows the consensus statement: Guidelines for Management of Incidental Pulmonary Nodules Detected on CT Images: From the Fleischner Society 2017; Radiology 2017; 284:228-243. 5. Rectosigmoid diverticulosis without acute inflammation. 6. Nonobstructive right nephrolithiasis. Aortic  Atherosclerosis (ICD10-I70.0). Electronically Signed  By: Constance Holster M.D.   On: 04/14/2019 21:22   CT Cervical Spine Wo Contrast  Result Date: 04/14/2019 CLINICAL DATA:  85 year old female with head trauma. EXAM: CT HEAD WITHOUT CONTRAST CT MAXILLOFACIAL WITHOUT CONTRAST CT CERVICAL SPINE WITHOUT CONTRAST TECHNIQUE: Multidetector CT imaging of the head, cervical spine, and maxillofacial structures were performed using the standard protocol without intravenous contrast. Multiplanar CT image reconstructions of the cervical spine and maxillofacial structures were also generated. COMPARISON:  Head CT dated 02/03/2019. FINDINGS: CT HEAD FINDINGS Brain: Mild age-related atrophy and chronic microvascular ischemic changes. Subcentimeter bilateral basal ganglia old lacunar infarcts. There is a small right temporoparietal subdural hemorrhage measuring up to 3 mm in thickness. Tiny linear high attenuation along the right frontal cortex (26/5, 19/7) may be artifactual or represent focal cortical contusion. No mass effect or midline shift. Vascular: No hyperdense vessel or unexpected calcification. 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 Skull: No acute calvarial pathology. Prior left temporal craniotomy. Other: Small right periorbital contusion. CT MAXILLOFACIAL FINDINGS Osseous: There is a depressed angulated fracture of the right zygomatic arch. Mildly angulated fractures of the posterolateral wall of the right maxillary sinus as well as nondisplaced fracture of the anterior wall of the right maxillary sinus. There is mild sutural diastasis of the lateral wall of the right orbit. There is a fracture of the right orbital floor. No other acute fracture. No mandibular dislocation. Orbits: The globes and retro-orbital fat are preserved. Sinuses: There is complete opacification of the right maxillary sinus consistent with hemosinus. No air-fluid level. The mastoid air cells are clear. Soft tissues: Mild  right facial contusion. CT CERVICAL SPINE FINDINGS Alignment: No acute subluxation. Skull base and vertebrae: No acute fracture. Osteopenia. Soft tissues and spinal canal: No prevertebral fluid or swelling. No visible canal hematoma. Disc levels: Degenerative changes primarily at C5-C6 with disc space narrowing and bone spurring. There is grade 1 C4-C5 anterolisthesis. Upper chest: Negative. Other: None IMPRESSION: 1. Small right temporoparietal subdural hemorrhage. No mass effect or midline shift. 2. No acute/traumatic cervical spine pathology. 3. Right facial bone fractures as above. These results were called by telephone at the time of interpretation on 04/14/2019 at 6:07 pm to provider [facility] , who verbally acknowledged these results. Electronically Signed   By: Anner Crete M.D.   On: 04/14/2019 18:23   CT PELVIS WO CONTRAST  Result Date: 04/14/2019 CLINICAL DATA:  Pelvic trauma EXAM: CT PELVIS WITHOUT CONTRAST TECHNIQUE: Multidetector CT imaging of the pelvis was performed following the standard protocol without intravenous contrast. COMPARISON:  Plain films today FINDINGS: Urinary Tract:  No abnormality visualized. Bowel:  Sigmoid diverticulosis.  No active diverticulitis. Vascular/Lymphatic: Aortoiliac atherosclerosis.  No adenopathy. Reproductive:  Uterus and adnexa unremarkable.  No mass. Other: Right pelvic hematoma noted within the right extraperitoneal space, displacing the bladder to the left. Enlargement of right pelvic abductor muscles compatible with intramuscular hematomas. Musculoskeletal: Fractures noted through the right acetabulum and involving the anterior, medial and posterior walls. Fracture through the right superior pubic ramus and inferior pubic ramus. Remote posttraumatic changes in the proximal right femur with internal fixation. IMPRESSION: Fractures through the right acetabulum involving the anterior, medial and posterior walls. Fracture through the lateral right superior  pubic ramus and the inferior pubic ramus. Remote posttraumatic and postsurgical changes in the right femur. Right pelvic intramuscular and extraperitoneal hematoma displacing the bladder to the left. Sigmoid diverticulosis. Aortoiliac atherosclerosis. Electronically Signed   By: Rolm Baptise M.D.   On: 04/14/2019  18:01   DG Hips Bilat W or Wo Pelvis 3-4 Views  Result Date: 04/14/2019 CLINICAL DATA:  85 year old female with fall. EXAM: DG HIP (WITH OR WITHOUT PELVIS) 3-4V BILAT COMPARISON:  Right hip radiograph dated 05/11/2019. FINDINGS: Evaluation is limited due to osteopenia and patient positioning and soft tissue attenuation. There is a mildly displaced acute fracture of the right acetabulum, new since the prior radiograph. There is a minimally displaced acute fracture of the right inferior pubic ramus. Comminuted fracture of the right femoral neck status post prior internal fixation. The hardware is intact. No other acute fracture identified. There is no dislocation. Degenerative changes of the lower lumbar spine. The soft tissues are unremarkable. IMPRESSION: Mildly displaced acute fracture of the right acetabulum and right inferior pubic ramus. Electronically Signed   By: Anner Crete M.D.   On: 04/14/2019 17:41   CT Maxillofacial WO CM  Result Date: 04/14/2019 CLINICAL DATA:  85 year old female with head trauma. EXAM: CT HEAD WITHOUT CONTRAST CT MAXILLOFACIAL WITHOUT CONTRAST CT CERVICAL SPINE WITHOUT CONTRAST TECHNIQUE: Multidetector CT imaging of the head, cervical spine, and maxillofacial structures were performed using the standard protocol without intravenous contrast. Multiplanar CT image reconstructions of the cervical spine and maxillofacial structures were also generated. COMPARISON:  Head CT dated 02/03/2019. FINDINGS: CT HEAD FINDINGS Brain: Mild age-related atrophy and chronic microvascular ischemic changes. Subcentimeter bilateral basal ganglia old lacunar infarcts. There is a small  right temporoparietal subdural hemorrhage measuring up to 3 mm in thickness. Tiny linear high attenuation along the right frontal cortex (26/5, 19/7) may be artifactual or represent focal cortical contusion. No mass effect or midline shift. Vascular: No hyperdense vessel or unexpected calcification. 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 Skull: No acute calvarial pathology. Prior left temporal craniotomy. Other: Small right periorbital contusion. CT MAXILLOFACIAL FINDINGS Osseous: There is a depressed angulated fracture of the right zygomatic arch. Mildly angulated fractures of the posterolateral wall of the right maxillary sinus as well as nondisplaced fracture of the anterior wall of the right maxillary sinus. There is mild sutural diastasis of the lateral wall of the right orbit. There is a fracture of the right orbital floor. No other acute fracture. No mandibular dislocation. Orbits: The globes and retro-orbital fat are preserved. Sinuses: There is complete opacification of the right maxillary sinus consistent with hemosinus. No air-fluid level. The mastoid air cells are clear. Soft tissues: Mild right facial contusion. CT CERVICAL SPINE FINDINGS Alignment: No acute subluxation. Skull base and vertebrae: No acute fracture. Osteopenia. Soft tissues and spinal canal: No prevertebral fluid or swelling. No visible canal hematoma. Disc levels: Degenerative changes primarily at C5-C6 with disc space narrowing and bone spurring. There is grade 1 C4-C5 anterolisthesis. Upper chest: Negative. Other: None IMPRESSION: 1. Small right temporoparietal subdural hemorrhage. No mass effect or midline shift. 2. No acute/traumatic cervical spine pathology. 3. Right facial bone fractures as above. These results were called by telephone at the time of interpretation on 04/14/2019 at 6:07 pm to provider [facility] , who verbally acknowledged these results. Electronically Signed   By: Anner Crete M.D.   On:  04/14/2019 18:23    EKG: Independently reviewed.  Sinus tachycardia.  Assessment/Plan Principal Problem:   Pelvic fracture (HCC) Active Problems:   Dementia (HCC)   Subdural hematoma (HCC)   Facial fracture (HCC)    1. Multiple fractures including right subdural hematoma with no mass-effect, pelvic fracture involving the right acetabulum and pubic rami and right facial fracture for which  neurosurgery has been consulted requested no intervention at this time, for the facial fracture Dr. Marla Roe has been consulted and will be following up.  For the pelvic fracture Dr. Griffin Basil orthopedic surgeon requested nonweightbearing.  Patient's CAT scan also shows hematoma of the pelvis.  Closely monitor. 2. Possible pneumonia for which patient was started on antibiotics.  If procalcitonin is negative may discontinue. 3. History of dementia.  Given that patient has multiple fractures will need close monitoring and inpatient status.   DVT prophylaxis: SCDs since patient has subdural hematoma. Code Status: DNR was confirmed with patient's daughter by the ER physician. Family Communication: ER physician discussed with patient's daughter. Disposition Plan: Rehab. Consults called: Orthopedics neurosurgery and facial trauma. Admission status: Inpatient.   Rise Patience MD [facility] Pager [phone].  If 7PM-7AM, please contact night-coverage [email or web address] Password [facility]  04/14/2019, 11:14 PM

## 2019-04-14 NOTE — ED Triage Notes (Signed)
Pt arrives with [facility] from [facility] for an unwitnessed fall this afternoon. Staff says the patient was sitting in a chair and then they found her on the floor with a laceration to the right side of her head.

## 2019-04-14 NOTE — Consult Note (Signed)
Chief Complaint   Chief Complaint  Patient presents with  . Fall  . Laceration    HPI   Consult requested by: Maxwell Marion, [facility] ED Reason for consult: subdural hematoma  HPI: [name] is a 85 y.o. female with history of dementia who resides at nursing facility, that presented to the ED after an unwitnessed fall today. She underwent work up by EDP and was found to have a small SDH, multiple facial fractures and pelvic fractures. A NSY consultation was requested for the SDH. Patient at baseline is reportedly A&Ox1, does not follow commands. At the time of my evaluation, she had just been given ativan due to agitation.   I have reviewed her med list and do not see any blood thinning medications. She is a DNR.  Patient Active Problem List   Diagnosis Date Noted  . Palliative care by specialist   . Goals of care, counseling/discussion   . Right hip pain   . Muscular weakness   . Delirium   . Displaced segmental fracture of shaft of right femur, initial encounter for closed fracture ([place]) 02/03/2019  . Dementia ([place]) 02/03/2019  . Cerebral aneurysm without rupture 01/23/2012  . Distal radius fracture, left 09/05/2011    PMH: Past Medical History:  Diagnosis Date  . Double vision    started 1st part of December 2013-Has  a Cerebral Aneurysm  . High cholesterol   . Hyperlipidemia   . Kidney stone     PSH: Past Surgical History:  Procedure Laterality Date  . BACK SURGERY    . CRANIOTOMY  01/23/2012   Procedure: CRANIOTOMY INTRACRANIAL ANEURYSM FOR CAROTID;  Surgeon: Winfield Cunas, MD;  Location: [facility];  Service: Neurosurgery;  Laterality: Left;  Left Pterional Craniotomy for aneurysm clipping  . INTRAMEDULLARY (IM) NAIL INTERTROCHANTERIC Right 02/04/2019   Procedure: INTRAMEDULLARY (IM) NAIL INTERTROCHANTRIC;  Surgeon: Hiram Gash, MD;  Location: [facility];  Service: Orthopedics;  Laterality: Right;  . Left wrist     fracture 02/2011  . ORIF ELBOW FRACTURE  Right 02/09/2019   Procedure: OPEN REDUCTION INTERNAL FIXATION (ORIF) ELBOW/OLECRANON FRACTURE;  Surgeon: Hiram Gash, MD;  Location: [facility];  Service: Orthopedics;  Laterality: Right;  . TUBAL LIGATION      (Not in a hospital admission)   SH: Social History   Tobacco Use  . Smoking status: Never Smoker  . Smokeless tobacco: Never Used  . Tobacco comment: quit over 20 years ago.  Substance Use Topics  . Alcohol use: No  . Drug use: No    MEDS: Prior to Admission medications   Medication Sig Start Date End Date Taking? Authorizing Provider  acetaminophen (TYLENOL) 325 MG tablet Take 2 tablets (650 mg total) by mouth every 6 (six) hours as needed. 02/14/19   Ghimire, Henreitta Leber, MD  cholecalciferol (VITAMIN D) 25 MCG tablet Take 1 tablet (1,000 Units total) by mouth daily. 02/15/19   Ghimire, Henreitta Leber, MD  enoxaparin (LOVENOX) 40 MG/0.4ML injection Inject 0.4 mLs (40 mg total) into the skin daily. 02/11/19 03/13/19  McBaneMaylene Roes, PA-C  feeding supplement, ENSURE ENLIVE, (ENSURE ENLIVE) LIQD Take 237 mLs by mouth 3 (three) times daily between meals. 02/14/19   Ghimire, Henreitta Leber, MD  ferrous sulfate 325 (65 FE) MG tablet Take 1 tablet (325 mg total) by mouth 2 (two) times daily with a meal. 02/14/19   Ghimire, Henreitta Leber, MD  oxyCODONE (OXY IR/ROXICODONE) 5 MG immediate release tablet Take 1  pills every 4-6 hrs as needed for pain 02/11/19   McBane, Maylene Roes, PA-C  polyethylene glycol (MIRALAX / GLYCOLAX) 17 g packet Take 17 g by mouth daily as needed for mild constipation. 02/14/19   Ghimire, Henreitta Leber, MD  senna-docusate (SENOKOT-S) 8.6-50 MG tablet Take 2 tablets by mouth 2 (two) times daily. 02/14/19   Ghimire, Henreitta Leber, MD  tamsulosin (FLOMAX) 0.4 MG CAPS capsule Take 1 capsule (0.4 mg total) by mouth daily. 02/15/19   Ghimire, Henreitta Leber, MD    ALLERGY: No Known Allergies  Social History   Tobacco Use  . Smoking status: Never Smoker  . Smokeless tobacco: Never Used  . Tobacco comment:  quit over 20 years ago.  Substance Use Topics  . Alcohol use: No     Family History  Family history unknown: Yes     ROS   ROS unable to obtain due to dementia  Exam   Vitals:   04/14/19 1802 04/14/19 1900  BP: 103/88 130/84  Pulse: (!) 110 (!) 105  Resp: 19 (!) 28  Temp:    SpO2: 94% 95%   General appearance: Cachetic elderly female, resting comfortably. C collar in place Eyes: No scleral injection, right lateral eye lac Cardiovascular: tachycardic Pulmonary: Effort normal, non-labored breathing Musculoskeletal: does not follow commands and therefore I am not able to do formal strength testing. Does move all extremities spontaneously, favors right leg due to fractures. Neurological Mental Status: drowsy, just given ativan. Unable to get any history. Cranial Nerves: difficult to assess due to inability to follow commands. Appear grossly normal Sensory: W/D all extremities to pain  Results - Imaging/Labs   Results for orders placed or performed during the hospital encounter of 04/14/19 (from the past 48 hour(s))  POC CBG, ED     Status: Abnormal   Collection Time: 04/14/19  4:26 PM  Result Value Ref Range   Glucose-Capillary 105 (H) 70 - 99 mg/dL    Comment: Glucose reference range applies only to samples taken after fasting for at least 8 hours.  Basic metabolic panel     Status: Abnormal   Collection Time: 04/14/19  4:33 PM  Result Value Ref Range   Sodium 140 135 - 145 mmol/L   Potassium 4.1 3.5 - 5.1 mmol/L   Chloride 106 98 - 111 mmol/L   CO2 25 22 - 32 mmol/L   Glucose, Bld 115 (H) 70 - 99 mg/dL    Comment: Glucose reference range applies only to samples taken after fasting for at least 8 hours.   BUN 13 8 - 23 mg/dL   Creatinine, Ser 0.59 0.44 - 1.00 mg/dL   Calcium 8.9 8.9 - 10.3 mg/dL   GFR calc non Af Amer >60 >60 mL/min   GFR calc Af Amer >60 >60 mL/min   Anion gap 9 5 - 15    Comment: Performed at [facility] [address], [place],  [place] [address]  CBC with Differential     Status: Abnormal   Collection Time: 04/14/19  4:33 PM  Result Value Ref Range   WBC 9.0 4.0 - 10.5 K/uL   RBC 4.23 3.87 - 5.11 MIL/uL   Hemoglobin 13.0 12.0 - 15.0 g/dL   HCT 42.1 36.0 - 46.0 %   MCV 99.5 80.0 - 100.0 fL   MCH 30.7 26.0 - 34.0 pg   MCHC 30.9 30.0 - 36.0 g/dL   RDW 13.6 11.5 - 15.5 %   Platelets 408 (H) 150 -  400 K/uL   nRBC 0.0 0.0 - 0.2 %   Neutrophils Relative % 70 %   Neutro Abs 6.3 1.7 - 7.7 K/uL   Lymphocytes Relative 20 %   Lymphs Abs 1.8 0.7 - 4.0 K/uL   Monocytes Relative 8 %   Monocytes Absolute 0.8 0.1 - 1.0 K/uL   Eosinophils Relative 1 %   Eosinophils Absolute 0.1 0.0 - 0.5 K/uL   Basophils Relative 0 %   Basophils Absolute 0.0 0.0 - 0.1 K/uL   Immature Granulocytes 1 %   Abs Immature Granulocytes 0.08 (H) 0.00 - 0.07 K/uL    Comment: Performed at [facility] [address] [place], [place] [address]  Troponin I (High Sensitivity)     Status: None   Collection Time: 04/14/19  4:39 PM  Result Value Ref Range   Troponin I (High Sensitivity) 4 <18 ng/L    Comment: (NOTE) Elevated high sensitivity troponin I (hsTnI) values and significant  changes across serial measurements may suggest ACS but many other  chronic and acute conditions are known to elevate hsTnI results.  Refer to the "Links" section for chest pain algorithms and additional  guidance. Performed at [facility], [place] [address], [place], [place] [address]   Troponin I (High Sensitivity)     Status: None   Collection Time: 04/14/19  6:50 PM  Result Value Ref Range   Troponin I (High Sensitivity) 5 <18 ng/L    Comment: (NOTE) Elevated high sensitivity troponin I (hsTnI) values and significant  changes across serial measurements may suggest ACS but many other  chronic and acute conditions are known to elevate hsTnI results.  Refer to the "Links" section for chest pain algorithms and additional  guidance. Performed at [facility], [place] [address] [place], [place] [address]     DG Chest 1 View  Result Date: 04/14/2019 CLINICAL DATA:  Fall. EXAM: CHEST  1 VIEW COMPARISON:  02/04/2019 FINDINGS: Lungs are hyperexpanded. Interstitial markings are diffusely coarsened with chronic features. Patchy opacity identified over the peripheral right upper lung. No dense focal airspace consolidation or pleural effusion. The cardiopericardial silhouette is within normal limits for size. Skin folds overlie the left hemithorax. Nodular density/densities projecting over the right parahilar lung is compatible with pad for telemetry leads. Bones are diffusely demineralized. IMPRESSION: 1. Hyperexpansion suggests emphysema. 2. Subtle ill-defined nodular density peripheral right upper lobe. This may be infectious/inflammatory. Follow-up imaging recommended to ensure resolution. Electronically Signed   By: Misty Stanley M.D.   On: 04/14/2019 17:40   DG Lumbar Spine Complete  Result Date: 04/14/2019 CLINICAL DATA:  Fall, pain EXAM: LUMBAR SPINE - COMPLETE 4+ VIEW COMPARISON:  Two-view chest x-ray 02/03/2019. FINDINGS: Diffuse osteopenia. Mild compression deformity at L1 and T11 and T10. The T10 fracture appears stable when compared to prior chest x-ray. The other levels are not well visualized on the prior chest x-ray. No malalignment. Diffuse degenerative disc and facet disease. IMPRESSION: Age-indeterminate mild compression fractures at T11 and L1. Chronic compression fracture at T10. Diffuse osteopenia. Electronically Signed   By: Rolm Baptise M.D.   On: 04/14/2019 17:43   CT Head Wo Contrast  Result Date: 04/14/2019 CLINICAL DATA:  85 year old female with head trauma. EXAM: CT HEAD WITHOUT CONTRAST CT MAXILLOFACIAL WITHOUT CONTRAST CT CERVICAL SPINE WITHOUT CONTRAST TECHNIQUE: Multidetector CT imaging of the head, cervical spine, and maxillofacial structures were performed using the standard protocol without intravenous contrast. Multiplanar  CT image reconstructions of the  cervical spine and maxillofacial structures were also generated. COMPARISON:  Head CT dated 02/03/2019. FINDINGS: CT HEAD FINDINGS Brain: Mild age-related atrophy and chronic microvascular ischemic changes. Subcentimeter bilateral basal ganglia old lacunar infarcts. There is a small right temporoparietal subdural hemorrhage measuring up to 3 mm in thickness. Tiny linear high attenuation along the right frontal cortex (26/5, 19/7) may be artifactual or represent focal cortical contusion. No mass effect or midline shift. Vascular: No hyperdense vessel or unexpected calcification. 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 Skull: No acute calvarial pathology. Prior left temporal craniotomy. Other: Small right periorbital contusion. CT MAXILLOFACIAL FINDINGS Osseous: There is a depressed angulated fracture of the right zygomatic arch. Mildly angulated fractures of the posterolateral wall of the right maxillary sinus as well as nondisplaced fracture of the anterior wall of the right maxillary sinus. There is mild sutural diastasis of the lateral wall of the right orbit. There is a fracture of the right orbital floor. No other acute fracture. No mandibular dislocation. Orbits: The globes and retro-orbital fat are preserved. Sinuses: There is complete opacification of the right maxillary sinus consistent with hemosinus. No air-fluid level. The mastoid air cells are clear. Soft tissues: Mild right facial contusion. CT CERVICAL SPINE FINDINGS Alignment: No acute subluxation. Skull base and vertebrae: No acute fracture. Osteopenia. Soft tissues and spinal canal: No prevertebral fluid or swelling. No visible canal hematoma. Disc levels: Degenerative changes primarily at C5-C6 with disc space narrowing and bone spurring. There is grade 1 C4-C5 anterolisthesis. Upper chest: Negative. Other: None IMPRESSION: 1. Small right temporoparietal subdural hemorrhage. No mass effect or midline  shift. 2. No acute/traumatic cervical spine pathology. 3. Right facial bone fractures as above. These results were called by telephone at the time of interpretation on 04/14/2019 at 6:07 pm to provider [facility] , who verbally acknowledged these results. Electronically Signed   By: Anner Crete M.D.   On: 04/14/2019 18:23   CT Cervical Spine Wo Contrast  Result Date: 04/14/2019 CLINICAL DATA:  85 year old female with head trauma. EXAM: CT HEAD WITHOUT CONTRAST CT MAXILLOFACIAL WITHOUT CONTRAST CT CERVICAL SPINE WITHOUT CONTRAST TECHNIQUE: Multidetector CT imaging of the head, cervical spine, and maxillofacial structures were performed using the standard protocol without intravenous contrast. Multiplanar CT image reconstructions of the cervical spine and maxillofacial structures were also generated. COMPARISON:  Head CT dated 02/03/2019. FINDINGS: CT HEAD FINDINGS Brain: Mild age-related atrophy and chronic microvascular ischemic changes. Subcentimeter bilateral basal ganglia old lacunar infarcts. There is a small right temporoparietal subdural hemorrhage measuring up to 3 mm in thickness. Tiny linear high attenuation along the right frontal cortex (26/5, 19/7) may be artifactual or represent focal cortical contusion. No mass effect or midline shift. Vascular: No hyperdense vessel or unexpected calcification. 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 Skull: No acute calvarial pathology. Prior left temporal craniotomy. Other: Small right periorbital contusion. CT MAXILLOFACIAL FINDINGS Osseous: There is a depressed angulated fracture of the right zygomatic arch. Mildly angulated fractures of the posterolateral wall of the right maxillary sinus as well as nondisplaced fracture of the anterior wall of the right maxillary sinus. There is mild sutural diastasis of the lateral wall of the right orbit. There is a fracture of the right orbital floor. No other acute fracture. No mandibular dislocation.  Orbits: The globes and retro-orbital fat are preserved. Sinuses: There is complete opacification of the right maxillary sinus consistent with hemosinus. No air-fluid level. The mastoid air cells are clear. Soft tissues: Mild right facial  contusion. CT CERVICAL SPINE FINDINGS Alignment: No acute subluxation. Skull base and vertebrae: No acute fracture. Osteopenia. Soft tissues and spinal canal: No prevertebral fluid or swelling. No visible canal hematoma. Disc levels: Degenerative changes primarily at C5-C6 with disc space narrowing and bone spurring. There is grade 1 C4-C5 anterolisthesis. Upper chest: Negative. Other: None IMPRESSION: 1. Small right temporoparietal subdural hemorrhage. No mass effect or midline shift. 2. No acute/traumatic cervical spine pathology. 3. Right facial bone fractures as above. These results were called by telephone at the time of interpretation on 04/14/2019 at 6:07 pm to provider [facility] , who verbally acknowledged these results. Electronically Signed   By: Anner Crete M.D.   On: 04/14/2019 18:23   CT PELVIS WO CONTRAST  Result Date: 04/14/2019 CLINICAL DATA:  Pelvic trauma EXAM: CT PELVIS WITHOUT CONTRAST TECHNIQUE: Multidetector CT imaging of the pelvis was performed following the standard protocol without intravenous contrast. COMPARISON:  Plain films today FINDINGS: Urinary Tract:  No abnormality visualized. Bowel:  Sigmoid diverticulosis.  No active diverticulitis. Vascular/Lymphatic: Aortoiliac atherosclerosis.  No adenopathy. Reproductive:  Uterus and adnexa unremarkable.  No mass. Other: Right pelvic hematoma noted within the right extraperitoneal space, displacing the bladder to the left. Enlargement of right pelvic abductor muscles compatible with intramuscular hematomas. Musculoskeletal: Fractures noted through the right acetabulum and involving the anterior, medial and posterior walls. Fracture through the right superior pubic ramus and inferior pubic ramus. Remote  posttraumatic changes in the proximal right femur with internal fixation. IMPRESSION: Fractures through the right acetabulum involving the anterior, medial and posterior walls. Fracture through the lateral right superior pubic ramus and the inferior pubic ramus. Remote posttraumatic and postsurgical changes in the right femur. Right pelvic intramuscular and extraperitoneal hematoma displacing the bladder to the left. Sigmoid diverticulosis. Aortoiliac atherosclerosis. Electronically Signed   By: Rolm Baptise M.D.   On: 04/14/2019 18:01   DG Hips Bilat W or Wo Pelvis 3-4 Views  Result Date: 04/14/2019 CLINICAL DATA:  85 year old female with fall. EXAM: DG HIP (WITH OR WITHOUT PELVIS) 3-4V BILAT COMPARISON:  Right hip radiograph dated 05/11/2019. FINDINGS: Evaluation is limited due to osteopenia and patient positioning and soft tissue attenuation. There is a mildly displaced acute fracture of the right acetabulum, new since the prior radiograph. There is a minimally displaced acute fracture of the right inferior pubic ramus. Comminuted fracture of the right femoral neck status post prior internal fixation. The hardware is intact. No other acute fracture identified. There is no dislocation. Degenerative changes of the lower lumbar spine. The soft tissues are unremarkable. IMPRESSION: Mildly displaced acute fracture of the right acetabulum and right inferior pubic ramus. Electronically Signed   By: Anner Crete M.D.   On: 04/14/2019 17:41   CT Maxillofacial WO CM  Result Date: 04/14/2019 CLINICAL DATA:  85 year old female with head trauma. EXAM: CT HEAD WITHOUT CONTRAST CT MAXILLOFACIAL WITHOUT CONTRAST CT CERVICAL SPINE WITHOUT CONTRAST TECHNIQUE: Multidetector CT imaging of the head, cervical spine, and maxillofacial structures were performed using the standard protocol without intravenous contrast. Multiplanar CT image reconstructions of the cervical spine and maxillofacial structures were also generated.  COMPARISON:  Head CT dated 02/03/2019. FINDINGS: CT HEAD FINDINGS Brain: Mild age-related atrophy and chronic microvascular ischemic changes. Subcentimeter bilateral basal ganglia old lacunar infarcts. There is a small right temporoparietal subdural hemorrhage measuring up to 3 mm in thickness. Tiny linear high attenuation along the right frontal cortex (26/5, 19/7) may be artifactual or represent focal cortical contusion. No mass effect or  midline shift. Vascular: No hyperdense vessel or unexpected calcification. 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 Skull: No acute calvarial pathology. Prior left temporal craniotomy. Other: Small right periorbital contusion. CT MAXILLOFACIAL FINDINGS Osseous: There is a depressed angulated fracture of the right zygomatic arch. Mildly angulated fractures of the posterolateral wall of the right maxillary sinus as well as nondisplaced fracture of the anterior wall of the right maxillary sinus. There is mild sutural diastasis of the lateral wall of the right orbit. There is a fracture of the right orbital floor. No other acute fracture. No mandibular dislocation. Orbits: The globes and retro-orbital fat are preserved. Sinuses: There is complete opacification of the right maxillary sinus consistent with hemosinus. No air-fluid level. The mastoid air cells are clear. Soft tissues: Mild right facial contusion. CT CERVICAL SPINE FINDINGS Alignment: No acute subluxation. Skull base and vertebrae: No acute fracture. Osteopenia. Soft tissues and spinal canal: No prevertebral fluid or swelling. No visible canal hematoma. Disc levels: Degenerative changes primarily at C5-C6 with disc space narrowing and bone spurring. There is grade 1 C4-C5 anterolisthesis. Upper chest: Negative. Other: None IMPRESSION: 1. Small right temporoparietal subdural hemorrhage. No mass effect or midline shift. 2. No acute/traumatic cervical spine pathology. 3. Right facial bone fractures as above.  These results were called by telephone at the time of interpretation on 04/14/2019 at 6:07 pm to provider [facility] , who verbally acknowledged these results. Electronically Signed   By: Anner Crete M.D.   On: 04/14/2019 18:23   Impression/Plan   85 y.o. female with multiple injuries including a small right temporoparietal SDH after an unwitnessed fall at her nursing facility where she resides. She has a history of dementia but is reportedly at neurologic baseline. She is a DNR. Plan to be admitted due to multiple injuries for monitoring.  Right frontoparietal SDH - no mass effect, no MLS There is obviously no role for NS intervention for this small SDH. Given patient's history of dementia and code status, should this worsen, we would rec transition to comfort care. I do not see a need to repeat a head CT as it will not change the NS plan of care. A CT can be repeated if needed for prognosis.   No f/u with NS necessary.  Please call for any concerns.  Ferne Reus, PA-C [facility] [employer]

## 2019-04-14 NOTE — ED Notes (Signed)
Patient transported to CT 

## 2019-04-14 NOTE — Progress Notes (Signed)
ORTHOPAEDIC PROGRESS NOTE  REQUESTING PHYSICIAN: Rise Patience, MD  Chief Complaint: fall  HPI: [name] is a 85 y.o. female with a past medical history significant for dementia and hyperlipidemia. Patient had a IM nailing of right intertrochanteric femur fracture on 02/04/2019 and open reduction internal fixation of right olecranon fracture on 02/09/2019 by Dr. Griffin Basil. Patient did reasonably well after both of these surgeries. She has been consistent with her outpatient follow-up after both of these surgeries.   Unfortunately, patient had an unwitnessed fall at her skilled nursing facility today. She was taken to [facility] by EMS. She sustained a right frontoparietal subdural hematoma, facial fractures, and pelvic fractures.   Orthopedics was consulted for the pelvic fractures. Upon examination, patient is sleeping in her hospital bed. She only grunts with questioning. She does not respond to commands. Her baseline is reportedly A&Ox1.   Past Medical History:  Diagnosis Date  . Double vision    started 1st part of December 2013-Has  a Cerebral Aneurysm  . High cholesterol   . Hyperlipidemia   . Kidney stone    Past Surgical History:  Procedure Laterality Date  . BACK SURGERY    . CRANIOTOMY  01/23/2012   Procedure: CRANIOTOMY INTRACRANIAL ANEURYSM FOR CAROTID;  Surgeon: Winfield Cunas, MD;  Location: [facility];  Service: Neurosurgery;  Laterality: Left;  Left Pterional Craniotomy for aneurysm clipping  . INTRAMEDULLARY (IM) NAIL INTERTROCHANTERIC Right 02/04/2019   Procedure: INTRAMEDULLARY (IM) NAIL INTERTROCHANTRIC;  Surgeon: Hiram Gash, MD;  Location: [facility];  Service: Orthopedics;  Laterality: Right;  . Left wrist     fracture 02/2011  . ORIF ELBOW FRACTURE Right 02/09/2019   Procedure: OPEN REDUCTION INTERNAL FIXATION (ORIF) ELBOW/OLECRANON FRACTURE;  Surgeon: Hiram Gash, MD;  Location: [facility];  Service: Orthopedics;  Laterality: Right;   . TUBAL LIGATION     Social History   Socioeconomic History  . Marital status: Widowed    Spouse name: Not on file  . Number of children: Not on file  . Years of education: Not on file  . Highest education level: Not on file  Occupational History  . Not on file  Tobacco Use  . Smoking status: Never Smoker  . Smokeless tobacco: Never Used  . Tobacco comment: quit over 20 years ago.  Substance and Sexual Activity  . Alcohol use: No  . Drug use: No  . Sexual activity: Not on file  Other Topics Concern  . Not on file  Social History Narrative  . Not on file   Social Determinants of Health   Financial Resource Strain:   . Difficulty of Paying Living Expenses:   Food Insecurity:   . Worried About Charity fundraiser in the Last Year:   . Arboriculturist in the Last Year:   Transportation Needs:   . Film/video editor (Medical):   Marland Kitchen Lack of Transportation (Non-Medical):   Physical Activity:   . Days of Exercise per Week:   . Minutes of Exercise per Session:   Stress:   . Feeling of Stress :   Social Connections:   . Frequency of Communication with Friends and Family:   . Frequency of Social Gatherings with Friends and Family:   . Attends Religious Services:   . Active Member of Clubs or Organizations:   . Attends Archivist Meetings:   Marland Kitchen Marital Status:    Family History  Family history  unknown: Yes   No Known Allergies Prior to Admission medications   Medication Sig Start Date End Date Taking? Authorizing Provider  acetaminophen (TYLENOL) 325 MG tablet Take 2 tablets (650 mg total) by mouth every 6 (six) hours as needed. 02/14/19   Ghimire, Henreitta Leber, MD  cholecalciferol (VITAMIN D) 25 MCG tablet Take 1 tablet (1,000 Units total) by mouth daily. 02/15/19   Ghimire, Henreitta Leber, MD  enoxaparin (LOVENOX) 40 MG/0.4ML injection Inject 0.4 mLs (40 mg total) into the skin daily. 02/11/19 03/13/19  McBaneMaylene Roes, PA-C  feeding supplement, ENSURE ENLIVE, (ENSURE  ENLIVE) LIQD Take 237 mLs by mouth 3 (three) times daily between meals. 02/14/19   Ghimire, Henreitta Leber, MD  ferrous sulfate 325 (65 FE) MG tablet Take 1 tablet (325 mg total) by mouth 2 (two) times daily with a meal. 02/14/19   Ghimire, Henreitta Leber, MD  oxyCODONE (OXY IR/ROXICODONE) 5 MG immediate release tablet Take 1 pills every 4-6 hrs as needed for pain 02/11/19   Collis Thede, Maylene Roes, PA-C  polyethylene glycol (MIRALAX / GLYCOLAX) 17 g packet Take 17 g by mouth daily as needed for mild constipation. 02/14/19   Ghimire, Henreitta Leber, MD  senna-docusate (SENOKOT-S) 8.6-50 MG tablet Take 2 tablets by mouth 2 (two) times daily. 02/14/19   Ghimire, Henreitta Leber, MD  tamsulosin (FLOMAX) 0.4 MG CAPS capsule Take 1 capsule (0.4 mg total) by mouth daily. 02/15/19   Ghimire, Henreitta Leber, MD   CT ABDOMEN PELVIS WO CONTRAST  Result Date: 04/14/2019 CLINICAL DATA:  Acute pain due to trauma EXAM: CT CHEST, ABDOMEN AND PELVIS WITHOUT CONTRAST TECHNIQUE: Multidetector CT imaging of the chest, abdomen and pelvis was performed following the standard protocol without IV contrast. COMPARISON:  CT pelvis from the same day. FINDINGS: CT CHEST FINDINGS Cardiovascular: The heart size is essentially normal. There is no evidence for thoracic aortic aneurysm. Coronary artery calcifications are noted. There is no significant pericardial effusion. Mediastinum/Nodes: --No mediastinal or hilar lymphadenopathy. --No axillary lymphadenopathy. --No supraclavicular lymphadenopathy. --Normal thyroid gland. --The esophagus is unremarkable Lungs/Pleura: There is a 1 cm ground-glass airspace opacity in the right upper lobe (axial series 6, image 26). There is no pneumothorax or large focal infiltrate. There is some atelectasis at the lung bases, right worse than left. Musculoskeletal: There are old bilateral right-sided rib fractures. There is no acute displaced fracture. There is sclerosis of the superior endplate of the T5 vertebral body. There is mild height loss  of the T5 vertebral body. There are chronic appearing compression fractures of the T11 and T7 vertebral bodies. CT ABDOMEN PELVIS FINDINGS Hepatobiliary: The liver is normal. Normal gallbladder.There is no biliary ductal dilation. Pancreas: Normal contours without ductal dilatation. No peripancreatic fluid collection. Spleen: Unremarkable. Adrenals/Urinary Tract: --Adrenal glands: Unremarkable. --Right kidney/ureter: There are punctate nonobstructing stones in the lower pole the right kidney. --Left kidney/ureter: No hydronephrosis or radiopaque kidney stones. --Urinary bladder: Unremarkable. Stomach/Bowel: --Stomach/Duodenum: No hiatal hernia or other gastric abnormality. Normal duodenal course and caliber. --Small bowel: Unremarkable. --Colon: Rectosigmoid diverticulosis without acute inflammation. --Appendix: Not visualized. No right lower quadrant inflammation or free fluid. Vascular/Lymphatic: Atherosclerotic calcification is present within the non-aneurysmal abdominal aorta, without hemodynamically significant stenosis. --No retroperitoneal lymphadenopathy. --No mesenteric lymphadenopathy. --No pelvic or inguinal lymphadenopathy. Reproductive: Unremarkable Other: No ascites or free air. The abdominal wall is normal. Musculoskeletal. Comminuted fractures again noted of the right acetabulum involving the anterior and posterior columns. There is a moderate-sized pelvic hematoma. There is a probable acute nondisplaced fracture  of the inferior pubic ramus on the right. There is a chronic appearing there is a chronic appearing compression fracture of the L5 vertebral body. IMPRESSION: 1. No acute traumatic abnormality detected involving the patient's thorax. 2. Again noted are acute pelvic fractures as previously described with a moderate-sized pelvic hematoma. 3. There is an age-indeterminate compression fracture of the superior endplate of the T5 vertebral body. Correlation with point tenderness is recommended.  There are additional chronic appearing thoracic spine compression fractures. 4. There is a 1 cm ground-glass airspace opacity in the right upper lobe. Initial follow-up with CT at 6-12 months is recommended to confirm persistence. If persistent, repeat CT is recommended every 2 years until 5 years of stability has been established. This recommendation follows the consensus statement: Guidelines for Management of Incidental Pulmonary Nodules Detected on CT Images: From the Fleischner Society 2017; Radiology 2017; 284:228-243. 5. Rectosigmoid diverticulosis without acute inflammation. 6. Nonobstructive right nephrolithiasis. Aortic Atherosclerosis (ICD10-I70.0). Electronically Signed   By: Constance Holster M.D.   On: 04/14/2019 21:22   DG Chest 1 View  Result Date: 04/14/2019 CLINICAL DATA:  Fall. EXAM: CHEST  1 VIEW COMPARISON:  02/04/2019 FINDINGS: Lungs are hyperexpanded. Interstitial markings are diffusely coarsened with chronic features. Patchy opacity identified over the peripheral right upper lung. No dense focal airspace consolidation or pleural effusion. The cardiopericardial silhouette is within normal limits for size. Skin folds overlie the left hemithorax. Nodular density/densities projecting over the right parahilar lung is compatible with pad for telemetry leads. Bones are diffusely demineralized. IMPRESSION: 1. Hyperexpansion suggests emphysema. 2. Subtle ill-defined nodular density peripheral right upper lobe. This may be infectious/inflammatory. Follow-up imaging recommended to ensure resolution. Electronically Signed   By: Misty Stanley M.D.   On: 04/14/2019 17:40   DG Lumbar Spine Complete  Result Date: 04/14/2019 CLINICAL DATA:  Fall, pain EXAM: LUMBAR SPINE - COMPLETE 4+ VIEW COMPARISON:  Two-view chest x-ray 02/03/2019. FINDINGS: Diffuse osteopenia. Mild compression deformity at L1 and T11 and T10. The T10 fracture appears stable when compared to prior chest x-ray. The other levels are  not well visualized on the prior chest x-ray. No malalignment. Diffuse degenerative disc and facet disease. IMPRESSION: Age-indeterminate mild compression fractures at T11 and L1. Chronic compression fracture at T10. Diffuse osteopenia. Electronically Signed   By: Rolm Baptise M.D.   On: 04/14/2019 17:43   CT Head Wo Contrast  Result Date: 04/14/2019 CLINICAL DATA:  85 year old female with head trauma. EXAM: CT HEAD WITHOUT CONTRAST CT MAXILLOFACIAL WITHOUT CONTRAST CT CERVICAL SPINE WITHOUT CONTRAST TECHNIQUE: Multidetector CT imaging of the head, cervical spine, and maxillofacial structures were performed using the standard protocol without intravenous contrast. Multiplanar CT image reconstructions of the cervical spine and maxillofacial structures were also generated. COMPARISON:  Head CT dated 02/03/2019. FINDINGS: CT HEAD FINDINGS Brain: Mild age-related atrophy and chronic microvascular ischemic changes. Subcentimeter bilateral basal ganglia old lacunar infarcts. There is a small right temporoparietal subdural hemorrhage measuring up to 3 mm in thickness. Tiny linear high attenuation along the right frontal cortex (26/5, 19/7) may be artifactual or represent focal cortical contusion. No mass effect or midline shift. Vascular: No hyperdense vessel or unexpected calcification. 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 Skull: No acute calvarial pathology. Prior left temporal craniotomy. Other: Small right periorbital contusion. CT MAXILLOFACIAL FINDINGS Osseous: There is a depressed angulated fracture of the right zygomatic arch. Mildly angulated fractures of the posterolateral wall of the right maxillary sinus as well as nondisplaced fracture of the anterior  wall of the right maxillary sinus. There is mild sutural diastasis of the lateral wall of the right orbit. There is a fracture of the right orbital floor. No other acute fracture. No mandibular dislocation. Orbits: The globes and retro-orbital  fat are preserved. Sinuses: There is complete opacification of the right maxillary sinus consistent with hemosinus. No air-fluid level. The mastoid air cells are clear. Soft tissues: Mild right facial contusion. CT CERVICAL SPINE FINDINGS Alignment: No acute subluxation. Skull base and vertebrae: No acute fracture. Osteopenia. Soft tissues and spinal canal: No prevertebral fluid or swelling. No visible canal hematoma. Disc levels: Degenerative changes primarily at C5-C6 with disc space narrowing and bone spurring. There is grade 1 C4-C5 anterolisthesis. Upper chest: Negative. Other: None IMPRESSION: 1. Small right temporoparietal subdural hemorrhage. No mass effect or midline shift. 2. No acute/traumatic cervical spine pathology. 3. Right facial bone fractures as above. These results were called by telephone at the time of interpretation on 04/14/2019 at 6:07 pm to provider [facility] , who verbally acknowledged these results. Electronically Signed   By: Anner Crete M.D.   On: 04/14/2019 18:23   CT Chest Wo Contrast  Result Date: 04/14/2019 CLINICAL DATA:  Acute pain due to trauma EXAM: CT CHEST, ABDOMEN AND PELVIS WITHOUT CONTRAST TECHNIQUE: Multidetector CT imaging of the chest, abdomen and pelvis was performed following the standard protocol without IV contrast. COMPARISON:  CT pelvis from the same day. FINDINGS: CT CHEST FINDINGS Cardiovascular: The heart size is essentially normal. There is no evidence for thoracic aortic aneurysm. Coronary artery calcifications are noted. There is no significant pericardial effusion. Mediastinum/Nodes: --No mediastinal or hilar lymphadenopathy. --No axillary lymphadenopathy. --No supraclavicular lymphadenopathy. --Normal thyroid gland. --The esophagus is unremarkable Lungs/Pleura: There is a 1 cm ground-glass airspace opacity in the right upper lobe (axial series 6, image 26). There is no pneumothorax or large focal infiltrate. There is some atelectasis at the lung  bases, right worse than left. Musculoskeletal: There are old bilateral right-sided rib fractures. There is no acute displaced fracture. There is sclerosis of the superior endplate of the T5 vertebral body. There is mild height loss of the T5 vertebral body. There are chronic appearing compression fractures of the T11 and T7 vertebral bodies. CT ABDOMEN PELVIS FINDINGS Hepatobiliary: The liver is normal. Normal gallbladder.There is no biliary ductal dilation. Pancreas: Normal contours without ductal dilatation. No peripancreatic fluid collection. Spleen: Unremarkable. Adrenals/Urinary Tract: --Adrenal glands: Unremarkable. --Right kidney/ureter: There are punctate nonobstructing stones in the lower pole the right kidney. --Left kidney/ureter: No hydronephrosis or radiopaque kidney stones. --Urinary bladder: Unremarkable. Stomach/Bowel: --Stomach/Duodenum: No hiatal hernia or other gastric abnormality. Normal duodenal course and caliber. --Small bowel: Unremarkable. --Colon: Rectosigmoid diverticulosis without acute inflammation. --Appendix: Not visualized. No right lower quadrant inflammation or free fluid. Vascular/Lymphatic: Atherosclerotic calcification is present within the non-aneurysmal abdominal aorta, without hemodynamically significant stenosis. --No retroperitoneal lymphadenopathy. --No mesenteric lymphadenopathy. --No pelvic or inguinal lymphadenopathy. Reproductive: Unremarkable Other: No ascites or free air. The abdominal wall is normal. Musculoskeletal. Comminuted fractures again noted of the right acetabulum involving the anterior and posterior columns. There is a moderate-sized pelvic hematoma. There is a probable acute nondisplaced fracture of the inferior pubic ramus on the right. There is a chronic appearing there is a chronic appearing compression fracture of the L5 vertebral body. IMPRESSION: 1. No acute traumatic abnormality detected involving the patient's thorax. 2. Again noted are acute  pelvic fractures as previously described with a moderate-sized pelvic hematoma. 3. There is an age-indeterminate compression  fracture of the superior endplate of the T5 vertebral body. Correlation with point tenderness is recommended. There are additional chronic appearing thoracic spine compression fractures. 4. There is a 1 cm ground-glass airspace opacity in the right upper lobe. Initial follow-up with CT at 6-12 months is recommended to confirm persistence. If persistent, repeat CT is recommended every 2 years until 5 years of stability has been established. This recommendation follows the consensus statement: Guidelines for Management of Incidental Pulmonary Nodules Detected on CT Images: From the Fleischner Society 2017; Radiology 2017; 284:228-243. 5. Rectosigmoid diverticulosis without acute inflammation. 6. Nonobstructive right nephrolithiasis. Aortic Atherosclerosis (ICD10-I70.0). Electronically Signed   By: Constance Holster M.D.   On: 04/14/2019 21:22   CT Cervical Spine Wo Contrast  Result Date: 04/14/2019 CLINICAL DATA:  85 year old female with head trauma. EXAM: CT HEAD WITHOUT CONTRAST CT MAXILLOFACIAL WITHOUT CONTRAST CT CERVICAL SPINE WITHOUT CONTRAST TECHNIQUE: Multidetector CT imaging of the head, cervical spine, and maxillofacial structures were performed using the standard protocol without intravenous contrast. Multiplanar CT image reconstructions of the cervical spine and maxillofacial structures were also generated. COMPARISON:  Head CT dated 02/03/2019. FINDINGS: CT HEAD FINDINGS Brain: Mild age-related atrophy and chronic microvascular ischemic changes. Subcentimeter bilateral basal ganglia old lacunar infarcts. There is a small right temporoparietal subdural hemorrhage measuring up to 3 mm in thickness. Tiny linear high attenuation along the right frontal cortex (26/5, 19/7) may be artifactual or represent focal cortical contusion. No mass effect or midline shift. Vascular: No  hyperdense vessel or unexpected calcification. 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 Skull: No acute calvarial pathology. Prior left temporal craniotomy. Other: Small right periorbital contusion. CT MAXILLOFACIAL FINDINGS Osseous: There is a depressed angulated fracture of the right zygomatic arch. Mildly angulated fractures of the posterolateral wall of the right maxillary sinus as well as nondisplaced fracture of the anterior wall of the right maxillary sinus. There is mild sutural diastasis of the lateral wall of the right orbit. There is a fracture of the right orbital floor. No other acute fracture. No mandibular dislocation. Orbits: The globes and retro-orbital fat are preserved. Sinuses: There is complete opacification of the right maxillary sinus consistent with hemosinus. No air-fluid level. The mastoid air cells are clear. Soft tissues: Mild right facial contusion. CT CERVICAL SPINE FINDINGS Alignment: No acute subluxation. Skull base and vertebrae: No acute fracture. Osteopenia. Soft tissues and spinal canal: No prevertebral fluid or swelling. No visible canal hematoma. Disc levels: Degenerative changes primarily at C5-C6 with disc space narrowing and bone spurring. There is grade 1 C4-C5 anterolisthesis. Upper chest: Negative. Other: None IMPRESSION: 1. Small right temporoparietal subdural hemorrhage. No mass effect or midline shift. 2. No acute/traumatic cervical spine pathology. 3. Right facial bone fractures as above. These results were called by telephone at the time of interpretation on 04/14/2019 at 6:07 pm to provider [facility] , who verbally acknowledged these results. Electronically Signed   By: Anner Crete M.D.   On: 04/14/2019 18:23   CT PELVIS WO CONTRAST  Result Date: 04/14/2019 CLINICAL DATA:  Pelvic trauma EXAM: CT PELVIS WITHOUT CONTRAST TECHNIQUE: Multidetector CT imaging of the pelvis was performed following the standard protocol without intravenous contrast.  COMPARISON:  Plain films today FINDINGS: Urinary Tract:  No abnormality visualized. Bowel:  Sigmoid diverticulosis.  No active diverticulitis. Vascular/Lymphatic: Aortoiliac atherosclerosis.  No adenopathy. Reproductive:  Uterus and adnexa unremarkable.  No mass. Other: Right pelvic hematoma noted within the right extraperitoneal space, displacing the bladder to the left. Enlargement  of right pelvic abductor muscles compatible with intramuscular hematomas. Musculoskeletal: Fractures noted through the right acetabulum and involving the anterior, medial and posterior walls. Fracture through the right superior pubic ramus and inferior pubic ramus. Remote posttraumatic changes in the proximal right femur with internal fixation. IMPRESSION: Fractures through the right acetabulum involving the anterior, medial and posterior walls. Fracture through the lateral right superior pubic ramus and the inferior pubic ramus. Remote posttraumatic and postsurgical changes in the right femur. Right pelvic intramuscular and extraperitoneal hematoma displacing the bladder to the left. Sigmoid diverticulosis. Aortoiliac atherosclerosis. Electronically Signed   By: Rolm Baptise M.D.   On: 04/14/2019 18:01   DG Hips Bilat W or Wo Pelvis 3-4 Views  Result Date: 04/14/2019 CLINICAL DATA:  85 year old female with fall. EXAM: DG HIP (WITH OR WITHOUT PELVIS) 3-4V BILAT COMPARISON:  Right hip radiograph dated 05/11/2019. FINDINGS: Evaluation is limited due to osteopenia and patient positioning and soft tissue attenuation. There is a mildly displaced acute fracture of the right acetabulum, new since the prior radiograph. There is a minimally displaced acute fracture of the right inferior pubic ramus. Comminuted fracture of the right femoral neck status post prior internal fixation. The hardware is intact. No other acute fracture identified. There is no dislocation. Degenerative changes of the lower lumbar spine. The soft tissues are  unremarkable. IMPRESSION: Mildly displaced acute fracture of the right acetabulum and right inferior pubic ramus. Electronically Signed   By: Anner Crete M.D.   On: 04/14/2019 17:41   CT Maxillofacial WO CM  Result Date: 04/14/2019 CLINICAL DATA:  85 year old female with head trauma. EXAM: CT HEAD WITHOUT CONTRAST CT MAXILLOFACIAL WITHOUT CONTRAST CT CERVICAL SPINE WITHOUT CONTRAST TECHNIQUE: Multidetector CT imaging of the head, cervical spine, and maxillofacial structures were performed using the standard protocol without intravenous contrast. Multiplanar CT image reconstructions of the cervical spine and maxillofacial structures were also generated. COMPARISON:  Head CT dated 02/03/2019. FINDINGS: CT HEAD FINDINGS Brain: Mild age-related atrophy and chronic microvascular ischemic changes. Subcentimeter bilateral basal ganglia old lacunar infarcts. There is a small right temporoparietal subdural hemorrhage measuring up to 3 mm in thickness. Tiny linear high attenuation along the right frontal cortex (26/5, 19/7) may be artifactual or represent focal cortical contusion. No mass effect or midline shift. Vascular: No hyperdense vessel or unexpected calcification. 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 Skull: No acute calvarial pathology. Prior left temporal craniotomy. Other: Small right periorbital contusion. CT MAXILLOFACIAL FINDINGS Osseous: There is a depressed angulated fracture of the right zygomatic arch. Mildly angulated fractures of the posterolateral wall of the right maxillary sinus as well as nondisplaced fracture of the anterior wall of the right maxillary sinus. There is mild sutural diastasis of the lateral wall of the right orbit. There is a fracture of the right orbital floor. No other acute fracture. No mandibular dislocation. Orbits: The globes and retro-orbital fat are preserved. Sinuses: There is complete opacification of the right maxillary sinus consistent with hemosinus.  No air-fluid level. The mastoid air cells are clear. Soft tissues: Mild right facial contusion. CT CERVICAL SPINE FINDINGS Alignment: No acute subluxation. Skull base and vertebrae: No acute fracture. Osteopenia. Soft tissues and spinal canal: No prevertebral fluid or swelling. No visible canal hematoma. Disc levels: Degenerative changes primarily at C5-C6 with disc space narrowing and bone spurring. There is grade 1 C4-C5 anterolisthesis. Upper chest: Negative. Other: None IMPRESSION: 1. Small right temporoparietal subdural hemorrhage. No mass effect or midline shift. 2. No acute/traumatic cervical spine  pathology. 3. Right facial bone fractures as above. These results were called by telephone at the time of interpretation on 04/14/2019 at 6:07 pm to provider [facility] , who verbally acknowledged these results. Electronically Signed   By: Anner Crete M.D.   On: 04/14/2019 18:23   Family History Reviewed and non-contributory, no pertinent history of problems with bleeding or anesthesia      Review of Systems Unable to obtain due to dementia   OBJECTIVE  Vitals: Patient Vitals for the past 8 hrs:  BP Temp Temp src Pulse Resp SpO2  04/14/19 2200 138/86 -- -- (!) 107 (!) 21 95 %  04/14/19 2145 -- -- -- (!) 107 (!) 21 94 %  04/14/19 2130 132/75 -- -- (!) 104 (!) 24 94 %  04/14/19 2000 122/70 -- -- (!) 108 15 95 %  04/14/19 1900 130/84 -- -- (!) 105 (!) 28 95 %  04/14/19 1802 103/88 -- -- (!) 110 19 94 %  04/14/19 1800 (!) 157/86 -- -- -- 14 98 %  04/14/19 1610 -- -- -- -- -- 98 %  04/14/19 1608 -- -- -- -- -- 97 %  04/14/19 1601 (!) 151/83 98.4 F (36.9 C) Oral 90 16 96 %   General: Resting comfortably in bed. Cachetic appearing. Cardiovascular: Warm extremities noted Respiratory: No cyanosis, no use of accessory musculature GI: No organomegaly, abdomen is soft and non-tender Skin: Right eye laceration, bruising on the right hand Neurologic: Drowsy. Unable to assess  Psychiatric:  Demented, drowsy Lymphatic: No swelling obvious and reported other than the area involved in the exam below  Musculoskeletal: (exam limited, patient demented and drowsy, does not follow commands) RUE: Tolerates passive ROM of shoulder, elbow, wrist, hand. Surgical incision from previous repair healing well.  LUE: Tolerates passive ROM of shoulder, elbow, wrist, hand. RLE: Tolerates passive ROM of hip, knee, ankle. Incision sites from previous surgery healing well. She did spontaneously move right leg throughout exam.  LLE: Tolerates passive ROM of hip, knee, ankle. Incision sites from previous surgery healing well. She did spontaneously move right leg throughout exam    Test Results Imaging CT pelvis reviewed. CT demonstrates right acetabular fracture and right superior/inferior pubic rami fractures. No periprosthetic fracture noted in right hip - stable IM nail, no hardware complicating features.   Labs cbc Recent Labs    04/14/19 1633 04/14/19 1938  WBC 9.0 17.9*  HGB 13.0 12.7  HCT 42.1 40.7  PLT 408* 382    Labs inflam No results for input(s): CRP in the last 72 hours.  Invalid input(s): ESR  Labs coag No results for input(s): INR, PTT in the last 72 hours.  Invalid input(s): PT  Recent Labs    04/14/19 1633  NA 140  K 4.1  CL 106  CO2 25  GLUCOSE 115*  BUN 13  CREATININE 0.59  CALCIUM 8.9    ASSESSMENT AND PLAN: 85 y.o. female with the following: Right acetabular and right pubic rami fractures  CT scan of the pelvis shows complex right acetabular fracture that appears well reduced as well as superior and inferior pubic rami fractures. Due to patient's age, co-morbidities including advanced dementia, and other injuries including subdural hematoma, patient is a poor surgical candidate.   - Weight Bearing Status/Activity: Weight bearing to patient's level of comfort - right lower extremity  - Additional recommended labs/tests: None - VTE Prophylaxis: per  medicine - Pain control: per medicine - Follow-up plan: Outpatient following in 1 week with  Dr. Griffin Basil for repeat imaging - Contact information: Dr. Ophelia Charter, [name], PA-C  [place], [place] 04/14/2019

## 2019-04-14 NOTE — ED Provider Notes (Signed)
[facility] EMERGENCY DEPARTMENT Provider Note   CSN: [id number] Arrival date & time: 04/14/19  1547     History Chief Complaint  Patient presents with  . Fall  . Laceration   Level 5 caveat due to dementia  [name] is a 85 y.o. female with history of dementia, hyperlipidemia, nephrolithiasis presents brought in by EMS from skilled nursing facility for evaluation after unwitnessed fall.  Per EMS, the patient was found on the floor near the nurses station but no one was around to see the fall. Patient is AxOx1 at baseline.  She complains of pain to both hips but had a recent admission for right hip fracture and right elbow fracture after a fall in January.  She reports mild headache, denies vision changes, nausea, vomiting, chest pain, shortness of breath, numbness or weakness of the extremities.  She is only oriented to person.  She is DNR.  The history is provided by the patient, the EMS personnel and medical records.       Past Medical History:  Diagnosis Date  . Double vision    started 1st part of December 2013-Has  a Cerebral Aneurysm  . High cholesterol   . Hyperlipidemia   . Kidney stone     Patient Active Problem List   Diagnosis Date Noted  . Pelvic fracture ([place]) 04/14/2019  . Subdural hematoma ([place]) 04/14/2019  . Facial fracture ([place]) 04/14/2019  . Palliative care by specialist   . Goals of care, counseling/discussion   . Right hip pain   . Muscular weakness   . Delirium   . Displaced segmental fracture of shaft of right femur, initial encounter for closed fracture ([place]) 02/03/2019  . Dementia ([place]) 02/03/2019  . Cerebral aneurysm without rupture 01/23/2012    Class: Acute  . Distal radius fracture, left 09/05/2011    Past Surgical History:  Procedure Laterality Date  . BACK SURGERY    . CRANIOTOMY  01/23/2012   Procedure: CRANIOTOMY INTRACRANIAL ANEURYSM FOR CAROTID;  Surgeon: Winfield Cunas, MD;  Location: [facility];   Service: Neurosurgery;  Laterality: Left;  Left Pterional Craniotomy for aneurysm clipping  . INTRAMEDULLARY (IM) NAIL INTERTROCHANTERIC Right 02/04/2019   Procedure: INTRAMEDULLARY (IM) NAIL INTERTROCHANTRIC;  Surgeon: Hiram Gash, MD;  Location: [facility];  Service: Orthopedics;  Laterality: Right;  . Left wrist     fracture 02/2011  . ORIF ELBOW FRACTURE Right 02/09/2019   Procedure: OPEN REDUCTION INTERNAL FIXATION (ORIF) ELBOW/OLECRANON FRACTURE;  Surgeon: Hiram Gash, MD;  Location: [facility];  Service: Orthopedics;  Laterality: Right;  . TUBAL LIGATION       OB History   No obstetric history on file.     Family History  Family history unknown: Yes    Social History   Tobacco Use  . Smoking status: Never Smoker  . Smokeless tobacco: Never Used  . Tobacco comment: quit over 20 years ago.  Substance Use Topics  . Alcohol use: No  . Drug use: No    Home Medications Prior to Admission medications   Medication Sig Start Date End Date Taking? Authorizing Provider  acetaminophen (TYLENOL) 325 MG tablet Take 2 tablets (650 mg total) by mouth every 6 (six) hours as needed. Patient taking differently: Take 650 mg by mouth every 6 (six) hours as needed (for pain- NOT TO EXCEED 4,000 MG/24 HOURS FROM ALL COMBINED SOURCES).  02/14/19  Yes Ghimire, Henreitta Leber, MD  Amino Acids-Protein Hydrolys (FEEDING SUPPLEMENT, PRO-STAT SUGAR  FREE 64,) LIQD Take 30 mLs by mouth in the morning.   Yes [provider]  ascorbic acid (VITAMIN C) 500 MG tablet Take 500 mg by mouth 2 (two) times daily.   Yes [provider]  Cholecalciferol (VITAMIN D-3) 25 MCG (1000 UT) CAPS Take 1,000 Units by mouth daily.   Yes [provider]  ferrous sulfate 325 (65 FE) MG tablet Take 1 tablet (325 mg total) by mouth 2 (two) times daily with a meal. 02/14/19  Yes Ghimire, Henreitta Leber, MD  Nutritional Supplements (RESOURCE 2.0) LIQD Take 120 mLs by mouth 3 (three) times daily.   Yes [provider]   oxyCODONE (OXY IR/ROXICODONE) 5 MG immediate release tablet Take 1 pills every 4-6 hrs as needed for pain Patient taking differently: Take 5 mg by mouth every 4 (four) hours as needed (for pain).  02/11/19  Yes McBane, Maylene Roes, PA-C  polyethylene glycol (MIRALAX / GLYCOLAX) 17 g packet Take 17 g by mouth daily as needed for mild constipation. Patient taking differently: Take 17 g by mouth daily as needed for mild constipation (MIX INTO 4-8 OUNCES OF LIQUID WHEN MIXING).  02/14/19  Yes Ghimire, Henreitta Leber, MD  senna-docusate (SENOKOT-S) 8.6-50 MG tablet Take 2 tablets by mouth 2 (two) times daily. 02/14/19  Yes Ghimire, Henreitta Leber, MD  tamsulosin (FLOMAX) 0.4 MG CAPS capsule Take 1 capsule (0.4 mg total) by mouth daily. 02/15/19  Yes Ghimire, Henreitta Leber, MD  zinc sulfate 220 (50 Zn) MG capsule Take 220 mg by mouth daily.   Yes [provider]  cholecalciferol (VITAMIN D) 25 MCG tablet Take 1 tablet (1,000 Units total) by mouth daily. Patient not taking: Reported on 04/14/2019 02/15/19   Jonetta Osgood, MD  enoxaparin (LOVENOX) 40 MG/0.4ML injection Inject 0.4 mLs (40 mg total) into the skin daily. Patient not taking: Reported on 04/14/2019 02/11/19 04/14/19  Ethelda Chick, PA-C  feeding supplement, ENSURE ENLIVE, (ENSURE ENLIVE) LIQD Take 237 mLs by mouth 3 (three) times daily between meals. Patient not taking: Reported on 04/14/2019 02/14/19   Jonetta Osgood, MD    Allergies    Patient has no known allergies.  Review of Systems   Review of Systems  Unable to perform ROS: Dementia    Physical Exam Updated Vital Signs BP 138/86   Pulse (!) 107   Temp 98.4 F (36.9 C) (Oral)   Resp (!) 21   SpO2 95%   Physical Exam Vitals and nursing note reviewed.  Constitutional:      General: She is not in acute distress.    Appearance: She is well-developed.  HENT:     Head: Normocephalic.     Comments: 3 cm linear laceration lateral to the right eyebrow.  No tenderness to palpation of the  face or scalp.  No raccoon eyes, rhinorrhea or hemotympanum bilaterally.  No battle signs.  Dentition is stable. Eyes:     General:        Right eye: No discharge.        Left eye: No discharge.     Conjunctiva/sclera: Conjunctivae normal.     Pupils: Pupils are equal, round, and reactive to light.     Comments: Subconjunctival hemorrhage noted to the right eye.  Neck:     Vascular: No JVD.     Trachea: No tracheal deviation.     Comments: C-collar in place Cardiovascular:     Rate and Rhythm: Normal rate and regular rhythm.  Pulmonary:  Effort: Pulmonary effort is normal.     Breath sounds: Normal breath sounds.  Chest:     Chest wall: No tenderness.  Abdominal:     General: There is no distension.     Tenderness: There is no abdominal tenderness. There is no guarding.  Musculoskeletal:     Cervical back: Neck supple.     Comments: Difficult to examine due to cooperation.  Grimaces upon palpation of the midline lumbar spine.  Complains of feeling cold when I range her lower extremities at the hips passively.  Pelvis appears stable with no crepitus.  5/5 strength of BUE major muscle groups.  She has a superficial skin tear to the dorsum of the right hand, bleeding controlled.  No snuffbox tenderness.  Skin:    General: Skin is warm and dry.     Findings: No erythema.  Neurological:     Mental Status: She is alert.     Comments: Oriented to person only.  Follows most commands but is somewhat difficult to examine due to her dementia.  Cranial nerves II through XII tested and intact.  Sensation intact to light touch of face and extremities.  Psychiatric:        Behavior: Behavior normal.     ED Results / Procedures / Treatments   Labs (all labs ordered are listed, but only abnormal results are displayed) Labs Reviewed  BASIC METABOLIC PANEL - Abnormal; Notable for the following components:      Result Value   Glucose, Bld 115 (*)    All other components within normal  limits  CBC WITH DIFFERENTIAL/PLATELET - Abnormal; Notable for the following components:   Platelets 408 (*)    Abs Immature Granulocytes 0.08 (*)    All other components within normal limits  CBC WITH DIFFERENTIAL/PLATELET - Abnormal; Notable for the following components:   WBC 17.9 (*)    Neutro Abs 15.4 (*)    Monocytes Absolute 1.5 (*)    Abs Immature Granulocytes 0.11 (*)    All other components within normal limits  CBG MONITORING, ED - Abnormal; Notable for the following components:   Glucose-Capillary 105 (*)    All other components within normal limits  CBG MONITORING, ED - Abnormal; Notable for the following components:   Glucose-Capillary 147 (*)    All other components within normal limits  SARS CORONAVIRUS 2 (TAT 6-24 HRS)  TROPONIN I (HIGH SENSITIVITY)  TROPONIN I (HIGH SENSITIVITY)    EKG EKG Interpretation  Date/Time:  Thursday April 14 2019 16:32:27 EDT Ventricular Rate:  90 PR Interval:  132 QRS Duration: 60 QT Interval:  354 QTC Calculation: 433 R Axis:   53 Text Interpretation: Normal sinus rhythm Low voltage QRS Cannot rule out Anterior infarct , age undetermined Abnormal ECG nonspecific changes Confirmed by Wandra Arthurs [phone]) on 04/14/2019 4:38:43 PM   Radiology CT ABDOMEN PELVIS WO CONTRAST  Result Date: 04/14/2019 CLINICAL DATA:  Acute pain due to trauma EXAM: CT CHEST, ABDOMEN AND PELVIS WITHOUT CONTRAST TECHNIQUE: Multidetector CT imaging of the chest, abdomen and pelvis was performed following the standard protocol without IV contrast. COMPARISON:  CT pelvis from the same day. FINDINGS: CT CHEST FINDINGS Cardiovascular: The heart size is essentially normal. There is no evidence for thoracic aortic aneurysm. Coronary artery calcifications are noted. There is no significant pericardial effusion. Mediastinum/Nodes: --No mediastinal or hilar lymphadenopathy. --No axillary lymphadenopathy. --No supraclavicular lymphadenopathy. --Normal thyroid gland. --The  esophagus is unremarkable Lungs/Pleura: There is a 1 cm ground-glass airspace  opacity in the right upper lobe (axial series 6, image 26). There is no pneumothorax or large focal infiltrate. There is some atelectasis at the lung bases, right worse than left. Musculoskeletal: There are old bilateral right-sided rib fractures. There is no acute displaced fracture. There is sclerosis of the superior endplate of the T5 vertebral body. There is mild height loss of the T5 vertebral body. There are chronic appearing compression fractures of the T11 and T7 vertebral bodies. CT ABDOMEN PELVIS FINDINGS Hepatobiliary: The liver is normal. Normal gallbladder.There is no biliary ductal dilation. Pancreas: Normal contours without ductal dilatation. No peripancreatic fluid collection. Spleen: Unremarkable. Adrenals/Urinary Tract: --Adrenal glands: Unremarkable. --Right kidney/ureter: There are punctate nonobstructing stones in the lower pole the right kidney. --Left kidney/ureter: No hydronephrosis or radiopaque kidney stones. --Urinary bladder: Unremarkable. Stomach/Bowel: --Stomach/Duodenum: No hiatal hernia or other gastric abnormality. Normal duodenal course and caliber. --Small bowel: Unremarkable. --Colon: Rectosigmoid diverticulosis without acute inflammation. --Appendix: Not visualized. No right lower quadrant inflammation or free fluid. Vascular/Lymphatic: Atherosclerotic calcification is present within the non-aneurysmal abdominal aorta, without hemodynamically significant stenosis. --No retroperitoneal lymphadenopathy. --No mesenteric lymphadenopathy. --No pelvic or inguinal lymphadenopathy. Reproductive: Unremarkable Other: No ascites or free air. The abdominal wall is normal. Musculoskeletal. Comminuted fractures again noted of the right acetabulum involving the anterior and posterior columns. There is a moderate-sized pelvic hematoma. There is a probable acute nondisplaced fracture of the inferior pubic ramus on the  right. There is a chronic appearing there is a chronic appearing compression fracture of the L5 vertebral body. IMPRESSION: 1. No acute traumatic abnormality detected involving the patient's thorax. 2. Again noted are acute pelvic fractures as previously described with a moderate-sized pelvic hematoma. 3. There is an age-indeterminate compression fracture of the superior endplate of the T5 vertebral body. Correlation with point tenderness is recommended. There are additional chronic appearing thoracic spine compression fractures. 4. There is a 1 cm ground-glass airspace opacity in the right upper lobe. Initial follow-up with CT at 6-12 months is recommended to confirm persistence. If persistent, repeat CT is recommended every 2 years until 5 years of stability has been established. This recommendation follows the consensus statement: Guidelines for Management of Incidental Pulmonary Nodules Detected on CT Images: From the Fleischner Society 2017; Radiology 2017; 284:228-243. 5. Rectosigmoid diverticulosis without acute inflammation. 6. Nonobstructive right nephrolithiasis. Aortic Atherosclerosis (ICD10-I70.0). Electronically Signed   By: Constance Holster M.D.   On: 04/14/2019 21:22   DG Chest 1 View  Result Date: 04/14/2019 CLINICAL DATA:  Fall. EXAM: CHEST  1 VIEW COMPARISON:  02/04/2019 FINDINGS: Lungs are hyperexpanded. Interstitial markings are diffusely coarsened with chronic features. Patchy opacity identified over the peripheral right upper lung. No dense focal airspace consolidation or pleural effusion. The cardiopericardial silhouette is within normal limits for size. Skin folds overlie the left hemithorax. Nodular density/densities projecting over the right parahilar lung is compatible with pad for telemetry leads. Bones are diffusely demineralized. IMPRESSION: 1. Hyperexpansion suggests emphysema. 2. Subtle ill-defined nodular density peripheral right upper lobe. This may be infectious/inflammatory.  Follow-up imaging recommended to ensure resolution. Electronically Signed   By: Misty Stanley M.D.   On: 04/14/2019 17:40   DG Lumbar Spine Complete  Result Date: 04/14/2019 CLINICAL DATA:  Fall, pain EXAM: LUMBAR SPINE - COMPLETE 4+ VIEW COMPARISON:  Two-view chest x-ray 02/03/2019. FINDINGS: Diffuse osteopenia. Mild compression deformity at L1 and T11 and T10. The T10 fracture appears stable when compared to prior chest x-ray. The other levels are not well visualized on the  prior chest x-ray. No malalignment. Diffuse degenerative disc and facet disease. IMPRESSION: Age-indeterminate mild compression fractures at T11 and L1. Chronic compression fracture at T10. Diffuse osteopenia. Electronically Signed   By: Rolm Baptise M.D.   On: 04/14/2019 17:43   CT Head Wo Contrast  Result Date: 04/14/2019 CLINICAL DATA:  85 year old female with head trauma. EXAM: CT HEAD WITHOUT CONTRAST CT MAXILLOFACIAL WITHOUT CONTRAST CT CERVICAL SPINE WITHOUT CONTRAST TECHNIQUE: Multidetector CT imaging of the head, cervical spine, and maxillofacial structures were performed using the standard protocol without intravenous contrast. Multiplanar CT image reconstructions of the cervical spine and maxillofacial structures were also generated. COMPARISON:  Head CT dated 02/03/2019. FINDINGS: CT HEAD FINDINGS Brain: Mild age-related atrophy and chronic microvascular ischemic changes. Subcentimeter bilateral basal ganglia old lacunar infarcts. There is a small right temporoparietal subdural hemorrhage measuring up to 3 mm in thickness. Tiny linear high attenuation along the right frontal cortex (26/5, 19/7) may be artifactual or represent focal cortical contusion. No mass effect or midline shift. Vascular: No hyperdense vessel or unexpected calcification. 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 Skull: No acute calvarial pathology. Prior left temporal craniotomy. Other: Small right periorbital contusion. CT MAXILLOFACIAL  FINDINGS Osseous: There is a depressed angulated fracture of the right zygomatic arch. Mildly angulated fractures of the posterolateral wall of the right maxillary sinus as well as nondisplaced fracture of the anterior wall of the right maxillary sinus. There is mild sutural diastasis of the lateral wall of the right orbit. There is a fracture of the right orbital floor. No other acute fracture. No mandibular dislocation. Orbits: The globes and retro-orbital fat are preserved. Sinuses: There is complete opacification of the right maxillary sinus consistent with hemosinus. No air-fluid level. The mastoid air cells are clear. Soft tissues: Mild right facial contusion. CT CERVICAL SPINE FINDINGS Alignment: No acute subluxation. Skull base and vertebrae: No acute fracture. Osteopenia. Soft tissues and spinal canal: No prevertebral fluid or swelling. No visible canal hematoma. Disc levels: Degenerative changes primarily at C5-C6 with disc space narrowing and bone spurring. There is grade 1 C4-C5 anterolisthesis. Upper chest: Negative. Other: None IMPRESSION: 1. Small right temporoparietal subdural hemorrhage. No mass effect or midline shift. 2. No acute/traumatic cervical spine pathology. 3. Right facial bone fractures as above. These results were called by telephone at the time of interpretation on 04/14/2019 at 6:07 pm to provider [facility] , who verbally acknowledged these results. Electronically Signed   By: Anner Crete M.D.   On: 04/14/2019 18:23   CT Chest Wo Contrast  Result Date: 04/14/2019 CLINICAL DATA:  Acute pain due to trauma EXAM: CT CHEST, ABDOMEN AND PELVIS WITHOUT CONTRAST TECHNIQUE: Multidetector CT imaging of the chest, abdomen and pelvis was performed following the standard protocol without IV contrast. COMPARISON:  CT pelvis from the same day. FINDINGS: CT CHEST FINDINGS Cardiovascular: The heart size is essentially normal. There is no evidence for thoracic aortic aneurysm. Coronary artery  calcifications are noted. There is no significant pericardial effusion. Mediastinum/Nodes: --No mediastinal or hilar lymphadenopathy. --No axillary lymphadenopathy. --No supraclavicular lymphadenopathy. --Normal thyroid gland. --The esophagus is unremarkable Lungs/Pleura: There is a 1 cm ground-glass airspace opacity in the right upper lobe (axial series 6, image 26). There is no pneumothorax or large focal infiltrate. There is some atelectasis at the lung bases, right worse than left. Musculoskeletal: There are old bilateral right-sided rib fractures. There is no acute displaced fracture. There is sclerosis of the superior endplate of the T5 vertebral body. There  is mild height loss of the T5 vertebral body. There are chronic appearing compression fractures of the T11 and T7 vertebral bodies. CT ABDOMEN PELVIS FINDINGS Hepatobiliary: The liver is normal. Normal gallbladder.There is no biliary ductal dilation. Pancreas: Normal contours without ductal dilatation. No peripancreatic fluid collection. Spleen: Unremarkable. Adrenals/Urinary Tract: --Adrenal glands: Unremarkable. --Right kidney/ureter: There are punctate nonobstructing stones in the lower pole the right kidney. --Left kidney/ureter: No hydronephrosis or radiopaque kidney stones. --Urinary bladder: Unremarkable. Stomach/Bowel: --Stomach/Duodenum: No hiatal hernia or other gastric abnormality. Normal duodenal course and caliber. --Small bowel: Unremarkable. --Colon: Rectosigmoid diverticulosis without acute inflammation. --Appendix: Not visualized. No right lower quadrant inflammation or free fluid. Vascular/Lymphatic: Atherosclerotic calcification is present within the non-aneurysmal abdominal aorta, without hemodynamically significant stenosis. --No retroperitoneal lymphadenopathy. --No mesenteric lymphadenopathy. --No pelvic or inguinal lymphadenopathy. Reproductive: Unremarkable Other: No ascites or free air. The abdominal wall is normal.  Musculoskeletal. Comminuted fractures again noted of the right acetabulum involving the anterior and posterior columns. There is a moderate-sized pelvic hematoma. There is a probable acute nondisplaced fracture of the inferior pubic ramus on the right. There is a chronic appearing there is a chronic appearing compression fracture of the L5 vertebral body. IMPRESSION: 1. No acute traumatic abnormality detected involving the patient's thorax. 2. Again noted are acute pelvic fractures as previously described with a moderate-sized pelvic hematoma. 3. There is an age-indeterminate compression fracture of the superior endplate of the T5 vertebral body. Correlation with point tenderness is recommended. There are additional chronic appearing thoracic spine compression fractures. 4. There is a 1 cm ground-glass airspace opacity in the right upper lobe. Initial follow-up with CT at 6-12 months is recommended to confirm persistence. If persistent, repeat CT is recommended every 2 years until 5 years of stability has been established. This recommendation follows the consensus statement: Guidelines for Management of Incidental Pulmonary Nodules Detected on CT Images: From the Fleischner Society 2017; Radiology 2017; 284:228-243. 5. Rectosigmoid diverticulosis without acute inflammation. 6. Nonobstructive right nephrolithiasis. Aortic Atherosclerosis (ICD10-I70.0). Electronically Signed   By: Constance Holster M.D.   On: 04/14/2019 21:22   CT Cervical Spine Wo Contrast  Result Date: 04/14/2019 CLINICAL DATA:  85 year old female with head trauma. EXAM: CT HEAD WITHOUT CONTRAST CT MAXILLOFACIAL WITHOUT CONTRAST CT CERVICAL SPINE WITHOUT CONTRAST TECHNIQUE: Multidetector CT imaging of the head, cervical spine, and maxillofacial structures were performed using the standard protocol without intravenous contrast. Multiplanar CT image reconstructions of the cervical spine and maxillofacial structures were also generated. COMPARISON:   Head CT dated 02/03/2019. FINDINGS: CT HEAD FINDINGS Brain: Mild age-related atrophy and chronic microvascular ischemic changes. Subcentimeter bilateral basal ganglia old lacunar infarcts. There is a small right temporoparietal subdural hemorrhage measuring up to 3 mm in thickness. Tiny linear high attenuation along the right frontal cortex (26/5, 19/7) may be artifactual or represent focal cortical contusion. No mass effect or midline shift. Vascular: No hyperdense vessel or unexpected calcification. 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 Skull: No acute calvarial pathology. Prior left temporal craniotomy. Other: Small right periorbital contusion. CT MAXILLOFACIAL FINDINGS Osseous: There is a depressed angulated fracture of the right zygomatic arch. Mildly angulated fractures of the posterolateral wall of the right maxillary sinus as well as nondisplaced fracture of the anterior wall of the right maxillary sinus. There is mild sutural diastasis of the lateral wall of the right orbit. There is a fracture of the right orbital floor. No other acute fracture. No mandibular dislocation. Orbits: The globes and retro-orbital fat are preserved.  Sinuses: There is complete opacification of the right maxillary sinus consistent with hemosinus. No air-fluid level. The mastoid air cells are clear. Soft tissues: Mild right facial contusion. CT CERVICAL SPINE FINDINGS Alignment: No acute subluxation. Skull base and vertebrae: No acute fracture. Osteopenia. Soft tissues and spinal canal: No prevertebral fluid or swelling. No visible canal hematoma. Disc levels: Degenerative changes primarily at C5-C6 with disc space narrowing and bone spurring. There is grade 1 C4-C5 anterolisthesis. Upper chest: Negative. Other: None IMPRESSION: 1. Small right temporoparietal subdural hemorrhage. No mass effect or midline shift. 2. No acute/traumatic cervical spine pathology. 3. Right facial bone fractures as above. These results  were called by telephone at the time of interpretation on 04/14/2019 at 6:07 pm to provider [facility] , who verbally acknowledged these results. Electronically Signed   By: Anner Crete M.D.   On: 04/14/2019 18:23   CT PELVIS WO CONTRAST  Result Date: 04/14/2019 CLINICAL DATA:  Pelvic trauma EXAM: CT PELVIS WITHOUT CONTRAST TECHNIQUE: Multidetector CT imaging of the pelvis was performed following the standard protocol without intravenous contrast. COMPARISON:  Plain films today FINDINGS: Urinary Tract:  No abnormality visualized. Bowel:  Sigmoid diverticulosis.  No active diverticulitis. Vascular/Lymphatic: Aortoiliac atherosclerosis.  No adenopathy. Reproductive:  Uterus and adnexa unremarkable.  No mass. Other: Right pelvic hematoma noted within the right extraperitoneal space, displacing the bladder to the left. Enlargement of right pelvic abductor muscles compatible with intramuscular hematomas. Musculoskeletal: Fractures noted through the right acetabulum and involving the anterior, medial and posterior walls. Fracture through the right superior pubic ramus and inferior pubic ramus. Remote posttraumatic changes in the proximal right femur with internal fixation. IMPRESSION: Fractures through the right acetabulum involving the anterior, medial and posterior walls. Fracture through the lateral right superior pubic ramus and the inferior pubic ramus. Remote posttraumatic and postsurgical changes in the right femur. Right pelvic intramuscular and extraperitoneal hematoma displacing the bladder to the left. Sigmoid diverticulosis. Aortoiliac atherosclerosis. Electronically Signed   By: Rolm Baptise M.D.   On: 04/14/2019 18:01   DG Hips Bilat W or Wo Pelvis 3-4 Views  Result Date: 04/14/2019 CLINICAL DATA:  85 year old female with fall. EXAM: DG HIP (WITH OR WITHOUT PELVIS) 3-4V BILAT COMPARISON:  Right hip radiograph dated 05/11/2019. FINDINGS: Evaluation is limited due to osteopenia and patient positioning  and soft tissue attenuation. There is a mildly displaced acute fracture of the right acetabulum, new since the prior radiograph. There is a minimally displaced acute fracture of the right inferior pubic ramus. Comminuted fracture of the right femoral neck status post prior internal fixation. The hardware is intact. No other acute fracture identified. There is no dislocation. Degenerative changes of the lower lumbar spine. The soft tissues are unremarkable. IMPRESSION: Mildly displaced acute fracture of the right acetabulum and right inferior pubic ramus. Electronically Signed   By: Anner Crete M.D.   On: 04/14/2019 17:41   CT Maxillofacial WO CM  Result Date: 04/14/2019 CLINICAL DATA:  85 year old female with head trauma. EXAM: CT HEAD WITHOUT CONTRAST CT MAXILLOFACIAL WITHOUT CONTRAST CT CERVICAL SPINE WITHOUT CONTRAST TECHNIQUE: Multidetector CT imaging of the head, cervical spine, and maxillofacial structures were performed using the standard protocol without intravenous contrast. Multiplanar CT image reconstructions of the cervical spine and maxillofacial structures were also generated. COMPARISON:  Head CT dated 02/03/2019. FINDINGS: CT HEAD FINDINGS Brain: Mild age-related atrophy and chronic microvascular ischemic changes. Subcentimeter bilateral basal ganglia old lacunar infarcts. There is a small right temporoparietal subdural hemorrhage measuring up to  3 mm in thickness. Tiny linear high attenuation along the right frontal cortex (26/5, 19/7) may be artifactual or represent focal cortical contusion. No mass effect or midline shift. Vascular: No hyperdense vessel or unexpected calcification. 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 Skull: No acute calvarial pathology. Prior left temporal craniotomy. Other: Small right periorbital contusion. CT MAXILLOFACIAL FINDINGS Osseous: There is a depressed angulated fracture of the right zygomatic arch. Mildly angulated fractures of the  posterolateral wall of the right maxillary sinus as well as nondisplaced fracture of the anterior wall of the right maxillary sinus. There is mild sutural diastasis of the lateral wall of the right orbit. There is a fracture of the right orbital floor. No other acute fracture. No mandibular dislocation. Orbits: The globes and retro-orbital fat are preserved. Sinuses: There is complete opacification of the right maxillary sinus consistent with hemosinus. No air-fluid level. The mastoid air cells are clear. Soft tissues: Mild right facial contusion. CT CERVICAL SPINE FINDINGS Alignment: No acute subluxation. Skull base and vertebrae: No acute fracture. Osteopenia. Soft tissues and spinal canal: No prevertebral fluid or swelling. No visible canal hematoma. Disc levels: Degenerative changes primarily at C5-C6 with disc space narrowing and bone spurring. There is grade 1 C4-C5 anterolisthesis. Upper chest: Negative. Other: None IMPRESSION: 1. Small right temporoparietal subdural hemorrhage. No mass effect or midline shift. 2. No acute/traumatic cervical spine pathology. 3. Right facial bone fractures as above. These results were called by telephone at the time of interpretation on 04/14/2019 at 6:07 pm to provider [facility] , who verbally acknowledged these results. Electronically Signed   By: Anner Crete M.D.   On: 04/14/2019 18:23    Procedures .Marland KitchenLaceration Repair  Date/Time: 04/14/2019 11:54 PM Performed by: Renita Papa, PA-C Authorized by: Rodell Perna A, PA-C   Consent:    Risks discussed:  Infection, need for additional repair, pain, poor cosmetic result and poor wound healing   Alternatives discussed:  No treatment and delayed treatment Universal protocol:    Procedure explained and questions answered to patient or proxy's satisfaction: yes     Relevant documents present and verified: yes     Test results available and properly labeled: yes     Imaging studies available: yes     Required blood  products, implants, devices, and special equipment available: yes     Site/side marked: yes     Immediately prior to procedure, a time out was called: yes     Patient identity confirmed:  Arm band and verbally with patient Anesthesia (see MAR for exact dosages):    Anesthesia method:  None Laceration details:    Location:  Face   Face location:  Forehead   Length (cm):  3   Depth (mm):  1 Repair type:    Repair type:  Simple Pre-procedure details:    Preparation:  Patient was prepped and draped in usual sterile fashion and imaging obtained to evaluate for foreign bodies Exploration:    Hemostasis achieved with:  Direct pressure   Wound exploration: wound explored through full range of motion     Wound extent: no foreign bodies/material noted     Contaminated: no   Treatment:    Area cleansed with:  Saline Skin repair:    Repair method:  Tissue adhesive Approximation:    Approximation:  Close Post-procedure details:    Dressing:  Open (no dressing)   Patient tolerance of procedure:  Tolerated well, no immediate complications .Critical Care Performed by: Nils Flack,  Roshawna Colclasure A, PA-C Authorized by: Renita Papa, PA-C   Critical care provider statement:    Critical care time (minutes):  45   Critical care was necessary to treat or prevent imminent or life-threatening deterioration of the following conditions:  CNS failure or compromise   Critical care was time spent personally by me on the following activities:  Discussions with consultants, evaluation of patient's response to treatment, examination of patient, ordering and performing treatments and interventions, ordering and review of laboratory studies, ordering and review of radiographic studies, pulse oximetry, re-evaluation of patient's condition, obtaining history from patient or surrogate and review of old charts   (including critical care time)  Medications Ordered in ED Medications  ondansetron (ZOFRAN) tablet 4 mg ( Oral See  Alternative 04/14/19 2323)    Or  ondansetron (ZOFRAN) injection 4 mg (4 mg Intravenous Given 04/14/19 2323)  fentaNYL (SUBLIMAZE) injection 25 mcg (25 mcg Intravenous Given 04/14/19 2322)  dextrose 5 %-0.9 % sodium chloride infusion ( Intravenous New Bag/Given 04/14/19 2342)  LORazepam (ATIVAN) injection 1 mg (1 mg Intramuscular Given 04/14/19 1758)  fentaNYL (SUBLIMAZE) injection 50 mcg (50 mcg Intravenous Given 04/14/19 1950)  cefTRIAXone (ROCEPHIN) 1 g in sodium chloride 0.9 % 100 mL IVPB (0 g Intravenous Stopped 04/14/19 2215)  azithromycin (ZITHROMAX) 500 mg in sodium chloride 0.9 % 250 mL IVPB (0 mg Intravenous Stopped 04/14/19 2324)    ED Course  I have reviewed the triage vital signs and the nursing notes.  Pertinent labs & imaging results that were available during my care of the patient were reviewed by me and considered in my medical decision making (see chart for details).    MDM Rules/Calculators/A&P                      Patient presenting brought in by EMS from skilled nursing facility for evaluation after unwitnessed fall outside the nurses station.  She is afebrile, intermittently tachycardic.  She is at mental status baseline on my initial assessment.  As she has a superficial laceration lateral to the right eyelid which was amenable to closure with Dermabond.  Base of the wound was visualized in a bloodless field.  She cries out in pain with passive range of motion of the hips.  Abdomen soft and nontender.  Examination is limited due to her baseline dementia.  Lab work reviewed and interpreted by myself initially shows no leukocytosis, no anemia, no metabolic derangements.  No renal insufficiency.  Imaging significant for multiple right acetabular and right pubic rami fractures, multiple right-sided facial fractures and small right subdural hematoma.  Also noted to have hematoma within the pelvis.  Right upper lobe imaging concerning for consolidation.  Will start on IV antibiotics for  pneumonia.  6:45 PM CONSULT: Spoke with Mariana Single, PA with neurosurgery.  They will plan to assess the patient emergently in the ED and make further recommendations though he suspects that the patient will not require any further neurosurgical interventions or even repeat scan.  7:30 PM CONSULT: Spoke with Dr. Griffin Basil with orthopedics who will review patient's images independently and with the orthopedic trauma specialist but suspect that the patient will likely be nonoperative with regards to her acetabular/pelvic fractures.  He recommends keeping the patient nonweightbearing.  7:55 PM CONSULT: Spoke with Dr. Marla Roe with trauma ENT. They will plan to assess the patient in the morning.   While in the ED the patient became a bit more tachycardic and blood pressure dropped slightly.  She appears mildly agitated and required a dose of IM Ativan for anxiolysis.  A CBC was repeated to ensure  Spoke with Dr. Hal Hope with [facility] who agrees to assume care of patient and bring her into the hospital for further evaluation and management.  Final Clinical Impression(s) / ED Diagnoses Final diagnoses:  Unwitnessed fall  Pneumonia of right upper lobe due to infectious organism  Closed nondisplaced fracture of right acetabulum, unspecified portion of acetabulum, initial encounter ([place])  Closed fracture of multiple rami of right pubis, initial encounter ([place])  Closed extensive facial fractures, initial encounter ([place])  Subdural hematoma ([facility])  Facial laceration, initial encounter  Pelvic hematoma, female    Rx / DC Orders ED Discharge Orders    None       Renita Papa, PA-C 04/15/19 0016    Drenda Freeze, MD 04/16/19 [phone]

## 2019-04-14 NOTE — ED Notes (Signed)
Pt transported to XR via stretcher at this time.   

## 2019-04-15 DIAGNOSIS — S065X9A Traumatic subdural hemorrhage with loss of consciousness of unspecified duration, initial encounter: Secondary | ICD-10-CM | POA: Diagnosis present

## 2019-04-15 DIAGNOSIS — S32409A Unspecified fracture of unspecified acetabulum, initial encounter for closed fracture: Secondary | ICD-10-CM

## 2019-04-15 DIAGNOSIS — S065XAA Traumatic subdural hemorrhage with loss of consciousness status unknown, initial encounter: Secondary | ICD-10-CM | POA: Diagnosis present

## 2019-04-15 LAB — COMPREHENSIVE METABOLIC PANEL
ALT: 18 U/L (ref 0–44)
AST: 21 U/L (ref 15–41)
Albumin: 2.9 g/dL — ABNORMAL LOW (ref 3.5–5.0)
Alkaline Phosphatase: 233 U/L — ABNORMAL HIGH (ref 38–126)
Anion gap: 8 (ref 5–15)
BUN: 16 mg/dL (ref 8–23)
CO2: 27 mmol/L (ref 22–32)
Calcium: 8.6 mg/dL — ABNORMAL LOW (ref 8.9–10.3)
Chloride: 104 mmol/L (ref 98–111)
Creatinine, Ser: 0.59 mg/dL (ref 0.44–1.00)
GFR calc Af Amer: >60 mL/min (ref >60)
GFR calc non Af Amer: >60 mL/min (ref >60)
Glucose, Bld: 128 mg/dL — ABNORMAL HIGH (ref 70–99)
Potassium: 4 mmol/L (ref 3.5–5.1)
Sodium: 139 mmol/L (ref 135–145)
Total Bilirubin: 0.6 mg/dL (ref 0.3–1.2)
Total Protein: 6.3 g/dL — ABNORMAL LOW (ref 6.5–8.1)

## 2019-04-15 LAB — CBC
HCT: 36.6 % (ref 36.0–46.0)
Hemoglobin: 11.5 g/dL — ABNORMAL LOW (ref 12.0–15.0)
MCH: 31.1 pg (ref 26.0–34.0)
MCHC: 31.4 g/dL (ref 30.0–36.0)
MCV: 98.9 fL (ref 80.0–100.0)
Platelets: 335 10*3/uL (ref 150–400)
RBC: 3.7 MIL/uL — ABNORMAL LOW (ref 3.87–5.11)
RDW: 13.7 % (ref 11.5–15.5)
WBC: 13.5 10*3/uL — ABNORMAL HIGH (ref 4.0–10.5)
nRBC: 0 % (ref 0.0–0.2)

## 2019-04-15 LAB — CBG MONITORING, ED: Glucose-Capillary: 110 mg/dL — ABNORMAL HIGH (ref 70–99)

## 2019-04-15 LAB — SARS CORONAVIRUS 2 (TAT 6-24 HRS): SARS Coronavirus 2: NEGATIVE

## 2019-04-15 LAB — PROCALCITONIN: Procalcitonin: 1.72 ng/mL

## 2019-04-15 MED ORDER — ACETAMINOPHEN 325 MG PO TABS
650.0000 mg | ORAL_TABLET | Freq: Four times a day (QID) | ORAL | Status: DC | PRN
  Administered 2019-04-16: 650 mg via ORAL
  Filled 2019-04-15: qty 2

## 2019-04-15 MED ORDER — LORAZEPAM 2 MG/ML IJ SOLN: 1.0000 mg | INTRAMUSCULAR | Status: DC | PRN

## 2019-04-15 MED ORDER — HALOPERIDOL 1 MG PO TABS
2.0000 mg | ORAL_TABLET | ORAL | Status: DC | PRN
  Filled 2019-04-15: qty 2

## 2019-04-15 MED ORDER — ATROPINE SULFATE 1 % OP SOLN
4.0000 [drp] | OPHTHALMIC | Status: DC | PRN
  Filled 2019-04-15: qty 2

## 2019-04-15 MED ORDER — OXYCODONE HCL 20 MG/ML PO CONC: 5.0000 mg | ORAL | Status: DC | PRN

## 2019-04-15 MED ORDER — HALOPERIDOL LACTATE 2 MG/ML PO CONC
2.0000 mg | ORAL | Status: DC | PRN
  Filled 2019-04-15: qty 1

## 2019-04-15 MED ORDER — OXYCODONE HCL 20 MG/ML PO CONC
5.0000 mg | ORAL | Status: DC | PRN
  Administered 2019-04-18: 5 mg via SUBLINGUAL
  Filled 2019-04-15: qty 1

## 2019-04-15 MED ORDER — LORAZEPAM 1 MG PO TABS: 1.0000 mg | ORAL_TABLET | ORAL | Status: DC | PRN

## 2019-04-15 MED ORDER — ONDANSETRON HCL 4 MG/2ML IJ SOLN: 4.0000 mg | Freq: Four times a day (QID) | INTRAMUSCULAR | Status: DC | PRN

## 2019-04-15 MED ORDER — HYDROMORPHONE HCL 1 MG/ML IJ SOLN: 0.5000 mg | INTRAMUSCULAR | Status: DC | PRN

## 2019-04-15 MED ORDER — SODIUM CHLORIDE 0.9 % IV SOLN
100.0000 mg | Freq: Two times a day (BID) | INTRAVENOUS | Status: DC
  Administered 2019-04-15 – 2019-04-16 (×3): 100 mg via INTRAVENOUS
  Filled 2019-04-15 (×4): qty 100

## 2019-04-15 MED ORDER — ERYTHROMYCIN 5 MG/GM OP OINT
1.0000 "application " | TOPICAL_OINTMENT | Freq: Three times a day (TID) | OPHTHALMIC | Status: DC
  Administered 2019-04-15 – 2019-04-18 (×9): 1 via OPHTHALMIC
  Filled 2019-04-15: qty 3.5

## 2019-04-15 MED ORDER — ONDANSETRON 4 MG PO TBDP: 4.0000 mg | ORAL_TABLET | Freq: Four times a day (QID) | ORAL | Status: DC | PRN

## 2019-04-15 MED ORDER — LORAZEPAM 2 MG/ML PO CONC: 1.0000 mg | ORAL | Status: DC | PRN

## 2019-04-15 MED ORDER — HALOPERIDOL LACTATE 5 MG/ML IJ SOLN: 2.0000 mg | INTRAMUSCULAR | Status: DC | PRN

## 2019-04-15 MED ORDER — DIPHENHYDRAMINE HCL 50 MG/ML IJ SOLN: 12.5000 mg | INTRAMUSCULAR | Status: DC | PRN

## 2019-04-15 MED ORDER — ACETAMINOPHEN 650 MG RE SUPP: 650.0000 mg | Freq: Four times a day (QID) | RECTAL | Status: DC | PRN

## 2019-04-15 NOTE — ED Notes (Signed)
Tele

## 2019-04-15 NOTE — Plan of Care (Signed)

## 2019-04-15 NOTE — Progress Notes (Signed)
[facility] PROGRESS NOTE  Patient: [name] [id number]   PCP: [name], MD DOB: 07-01-1934   DOA: 04/14/2019   DOS: 04/15/2019    Assessment and plan: Patient complaining of ongoing pain and her multiple fractures including subdural hematoma, right acetabular fracture, right pelvic hematoma as well as right facial fracture with orbital floor fracture and patient's prior history of failure to thrive with malnutrition. I had a discussion with patient's daughter who is the POA on phone.  Patient is already DNR. During last admission palliative care was consulted for goals of care discussion and plan was to transition to hospice if the patient fails SNF. I recommended to transition to complete comfort for this patient and the daughter agreed. Likely would be a candidate for residential hospice.  Author: Berle Mull, MD [facility] 04/15/2019 11:04 AM   If 7PM-7AM, please contact night-coverage at [email or web address]

## 2019-04-15 NOTE — Progress Notes (Signed)
Nutrition Brief Note  Chart reviewed. Pt now transitioning to comfort care.  No further nutrition interventions warranted at this time.  Please re-consult as needed.   Lisamarie Coke W, RD, LDN, CDCES Registered Dietitian II Certified Diabetes Care and Education Specialist Please refer to AMION for RD and/or RD on-call/weekend/after hours pager  

## 2019-04-15 NOTE — Progress Notes (Signed)
Received patient from the ER via stretcher.  The patient is alert but appears to go back to sleep quickly.  The patient becomes agitated, pulling and screaming when attempted to move. The patient is obviously confused to time and place but can recall her name,  Numerous small abrasions over the all extremities with bruising  Dried bloody laceration noted to the right eye lid with facial bruising.  Foam dressing attempted to the coccyx area but the patient pulls and screams.  Appears to be painful when moving.  A small open blister noted to the right heel  Foam dressings applied to both heels.  The patient appears very thin.

## 2019-04-15 NOTE — Progress Notes (Signed)
[facility] Progress Note  Patient: [name]    [id number]  DOA: 04/14/2019     Date of Service: the patient was seen and examined on 04/15/2019  Chief Complaint  Patient presents with  . Fall  . Laceration   Brief hospital course: [name] is a 85 y.o. female with history of advanced dementia who was admitted in January 2021 for right femur fracture underwent intramedullary nailing had a unwitnessed fall at the nursing facility and was brought to the ER.  As per the report patient was sitting on a chair and few minutes later was found on the floor.  Had right periorbital hematoma. Patient had multiple fractures and recurrent fall. Currently further plan is currently plan is to focus on comfort and pain control.  Assessment and Plan: 1.  Subdural hematoma Small, right subdural hematoma seen on the CT scan. No mass-effect. No significant focal deficit. Neurosurgery was consulted who recommended conservative measures and do not think that repeating a CT scan is also indicated at this point given her poor surgical candidacy.  2.  Right facial fracture, right orbital floor fracture Plastic surgery consulted appreciate input. Currently on recommends an ointment.  Monitor.  3.  Right pelvic fracture with acetabulum and pubic rami Orthopedic consulted. Currently recommendation is for conservative measures and nonweightbearing   4.  Right pelvic hematoma From the fall. Currently pressing on the bladder. Continue pain control for now.  5.  Goals of care discussion. Patient with failure to thrive and recurrent fall as well as dementia. Patient had a conversation with palliative care last admission and goal was to consider SNF for rehabilitation and if there is no improvement or benefit out of the rehabilitation consider hospice. Patient currently in with multiple fractures and multiple injury with pain as her primary complaint. Discussed with family recommended to  transition to a course of hospice and will comfort care. Family agreed to this recommendation. Currently pain adequately controlled will monitor.  6.  Emphysema Right upper lobe pneumonia Procalcitonin mildly elevated. Started on IV doxycycline. We will continue doxycycline for now and monitor response.  Diet: Regular diet DVT Prophylaxis: Comfort care  Advance goals of care discussion: Comfort care, DNR  Family Communication: no family was present at bedside, at the time of interview.   Disposition:  Pt is from SNF, admitted with multiple fractures and SDH, still has multiple fractures with pain medication requirement, which precludes a safe discharge. Discharge to hospice.  Subjective: Patient significantly confused likely from her baseline dementia.  No nausea no vomiting.  No acute events.  Later in the afternoon patient was pleasantly confused on my reevaluation.  Physical Exam: General:  alert not oriented to time, place, and person.  Appear in moderate distress, affect tearful Eyes: PERRL ENT: Oral Mucosa Clear, moist  Neck: no JVD,  Cardiovascular: S1 and S2 Present, no Murmur,  Respiratory: increased respiratory effort, Bilateral Air entry equal and Decreased, no Crackles, no wheezes Abdomen: Bowel Sound present, Soft and difficult to assess  tenderness,  Skin: no rash Extremities: no Pedal edema, no calf tenderness Neurologic: without any new focal findings  Gait not checked due to patient safety concerns  Vitals:   04/15/19 1030 04/15/19 1100 04/15/19 1130 04/15/19 1333  BP: 128/72 130/75 121/63 129/76  Pulse:    93  Resp: (!) 21 14 (!) 27 16  Temp:    98.7 F (37.1 C)  TempSrc:      SpO2:    100%  No intake or output data in the 24 hours ending 04/15/19 1739 There were no vitals filed for this visit.  Data Reviewed: I have personally reviewed and interpreted daily labs, tele strips, imagings as discussed above. I reviewed all nursing notes, pharmacy  notes, vitals, pertinent old records I have discussed plan of care as described above with RN and patient/family.  CBC: Recent Labs  Lab 04/14/19 1633 04/14/19 1938 04/15/19 0817  WBC 9.0 17.9* 13.5*  NEUTROABS 6.3 15.4*  --   HGB 13.0 12.7 11.5*  HCT 42.1 40.7 36.6  MCV 99.5 98.3 98.9  PLT 408* 382 [id number]   Basic Metabolic Panel: Recent Labs  Lab 04/14/19 1633 04/15/19 0817  NA 140 139  K 4.1 4.0  CL 106 104  CO2 25 27  GLUCOSE 115* 128*  BUN 13 16  CREATININE 0.59 0.59  CALCIUM 8.9 8.6*    Studies: CT ABDOMEN PELVIS WO CONTRAST  Result Date: 04/14/2019 CLINICAL DATA:  Acute pain due to trauma EXAM: CT CHEST, ABDOMEN AND PELVIS WITHOUT CONTRAST TECHNIQUE: Multidetector CT imaging of the chest, abdomen and pelvis was performed following the standard protocol without IV contrast. COMPARISON:  CT pelvis from the same day. FINDINGS: CT CHEST FINDINGS Cardiovascular: The heart size is essentially normal. There is no evidence for thoracic aortic aneurysm. Coronary artery calcifications are noted. There is no significant pericardial effusion. Mediastinum/Nodes: --No mediastinal or hilar lymphadenopathy. --No axillary lymphadenopathy. --No supraclavicular lymphadenopathy. --Normal thyroid gland. --The esophagus is unremarkable Lungs/Pleura: There is a 1 cm ground-glass airspace opacity in the right upper lobe (axial series 6, image 26). There is no pneumothorax or large focal infiltrate. There is some atelectasis at the lung bases, right worse than left. Musculoskeletal: There are old bilateral right-sided rib fractures. There is no acute displaced fracture. There is sclerosis of the superior endplate of the T5 vertebral body. There is mild height loss of the T5 vertebral body. There are chronic appearing compression fractures of the T11 and T7 vertebral bodies. CT ABDOMEN PELVIS FINDINGS Hepatobiliary: The liver is normal. Normal gallbladder.There is no biliary ductal dilation. Pancreas:  Normal contours without ductal dilatation. No peripancreatic fluid collection. Spleen: Unremarkable. Adrenals/Urinary Tract: --Adrenal glands: Unremarkable. --Right kidney/ureter: There are punctate nonobstructing stones in the lower pole the right kidney. --Left kidney/ureter: No hydronephrosis or radiopaque kidney stones. --Urinary bladder: Unremarkable. Stomach/Bowel: --Stomach/Duodenum: No hiatal hernia or other gastric abnormality. Normal duodenal course and caliber. --Small bowel: Unremarkable. --Colon: Rectosigmoid diverticulosis without acute inflammation. --Appendix: Not visualized. No right lower quadrant inflammation or free fluid. Vascular/Lymphatic: Atherosclerotic calcification is present within the non-aneurysmal abdominal aorta, without hemodynamically significant stenosis. --No retroperitoneal lymphadenopathy. --No mesenteric lymphadenopathy. --No pelvic or inguinal lymphadenopathy. Reproductive: Unremarkable Other: No ascites or free air. The abdominal wall is normal. Musculoskeletal. Comminuted fractures again noted of the right acetabulum involving the anterior and posterior columns. There is a moderate-sized pelvic hematoma. There is a probable acute nondisplaced fracture of the inferior pubic ramus on the right. There is a chronic appearing there is a chronic appearing compression fracture of the L5 vertebral body. IMPRESSION: 1. No acute traumatic abnormality detected involving the patient's thorax. 2. Again noted are acute pelvic fractures as previously described with a moderate-sized pelvic hematoma. 3. There is an age-indeterminate compression fracture of the superior endplate of the T5 vertebral body. Correlation with point tenderness is recommended. There are additional chronic appearing thoracic spine compression fractures. 4. There is a 1 cm ground-glass airspace opacity in the right upper lobe. Initial  follow-up with CT at 6-12 months is recommended to confirm persistence. If  persistent, repeat CT is recommended every 2 years until 5 years of stability has been established. This recommendation follows the consensus statement: Guidelines for Management of Incidental Pulmonary Nodules Detected on CT Images: From the Fleischner Society 2017; Radiology 2017; 284:228-243. 5. Rectosigmoid diverticulosis without acute inflammation. 6. Nonobstructive right nephrolithiasis. Aortic Atherosclerosis (ICD10-I70.0). Electronically Signed   By: Constance Holster M.D.   On: 04/14/2019 21:22   CT Head Wo Contrast  Result Date: 04/14/2019 CLINICAL DATA:  85 year old female with head trauma. EXAM: CT HEAD WITHOUT CONTRAST CT MAXILLOFACIAL WITHOUT CONTRAST CT CERVICAL SPINE WITHOUT CONTRAST TECHNIQUE: Multidetector CT imaging of the head, cervical spine, and maxillofacial structures were performed using the standard protocol without intravenous contrast. Multiplanar CT image reconstructions of the cervical spine and maxillofacial structures were also generated. COMPARISON:  Head CT dated 02/03/2019. FINDINGS: CT HEAD FINDINGS Brain: Mild age-related atrophy and chronic microvascular ischemic changes. Subcentimeter bilateral basal ganglia old lacunar infarcts. There is a small right temporoparietal subdural hemorrhage measuring up to 3 mm in thickness. Tiny linear high attenuation along the right frontal cortex (26/5, 19/7) may be artifactual or represent focal cortical contusion. No mass effect or midline shift. Vascular: No hyperdense vessel or unexpected calcification. 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 Skull: No acute calvarial pathology. Prior left temporal craniotomy. Other: Small right periorbital contusion. CT MAXILLOFACIAL FINDINGS Osseous: There is a depressed angulated fracture of the right zygomatic arch. Mildly angulated fractures of the posterolateral wall of the right maxillary sinus as well as nondisplaced fracture of the anterior wall of the right maxillary sinus.  There is mild sutural diastasis of the lateral wall of the right orbit. There is a fracture of the right orbital floor. No other acute fracture. No mandibular dislocation. Orbits: The globes and retro-orbital fat are preserved. Sinuses: There is complete opacification of the right maxillary sinus consistent with hemosinus. No air-fluid level. The mastoid air cells are clear. Soft tissues: Mild right facial contusion. CT CERVICAL SPINE FINDINGS Alignment: No acute subluxation. Skull base and vertebrae: No acute fracture. Osteopenia. Soft tissues and spinal canal: No prevertebral fluid or swelling. No visible canal hematoma. Disc levels: Degenerative changes primarily at C5-C6 with disc space narrowing and bone spurring. There is grade 1 C4-C5 anterolisthesis. Upper chest: Negative. Other: None IMPRESSION: 1. Small right temporoparietal subdural hemorrhage. No mass effect or midline shift. 2. No acute/traumatic cervical spine pathology. 3. Right facial bone fractures as above. These results were called by telephone at the time of interpretation on 04/14/2019 at 6:07 pm to provider [facility] , who verbally acknowledged these results. Electronically Signed   By: Anner Crete M.D.   On: 04/14/2019 18:23   CT Chest Wo Contrast  Result Date: 04/14/2019 CLINICAL DATA:  Acute pain due to trauma EXAM: CT CHEST, ABDOMEN AND PELVIS WITHOUT CONTRAST TECHNIQUE: Multidetector CT imaging of the chest, abdomen and pelvis was performed following the standard protocol without IV contrast. COMPARISON:  CT pelvis from the same day. FINDINGS: CT CHEST FINDINGS Cardiovascular: The heart size is essentially normal. There is no evidence for thoracic aortic aneurysm. Coronary artery calcifications are noted. There is no significant pericardial effusion. Mediastinum/Nodes: --No mediastinal or hilar lymphadenopathy. --No axillary lymphadenopathy. --No supraclavicular lymphadenopathy. --Normal thyroid gland. --The esophagus is  unremarkable Lungs/Pleura: There is a 1 cm ground-glass airspace opacity in the right upper lobe (axial series 6, image 26). There is no pneumothorax or large focal  infiltrate. There is some atelectasis at the lung bases, right worse than left. Musculoskeletal: There are old bilateral right-sided rib fractures. There is no acute displaced fracture. There is sclerosis of the superior endplate of the T5 vertebral body. There is mild height loss of the T5 vertebral body. There are chronic appearing compression fractures of the T11 and T7 vertebral bodies. CT ABDOMEN PELVIS FINDINGS Hepatobiliary: The liver is normal. Normal gallbladder.There is no biliary ductal dilation. Pancreas: Normal contours without ductal dilatation. No peripancreatic fluid collection. Spleen: Unremarkable. Adrenals/Urinary Tract: --Adrenal glands: Unremarkable. --Right kidney/ureter: There are punctate nonobstructing stones in the lower pole the right kidney. --Left kidney/ureter: No hydronephrosis or radiopaque kidney stones. --Urinary bladder: Unremarkable. Stomach/Bowel: --Stomach/Duodenum: No hiatal hernia or other gastric abnormality. Normal duodenal course and caliber. --Small bowel: Unremarkable. --Colon: Rectosigmoid diverticulosis without acute inflammation. --Appendix: Not visualized. No right lower quadrant inflammation or free fluid. Vascular/Lymphatic: Atherosclerotic calcification is present within the non-aneurysmal abdominal aorta, without hemodynamically significant stenosis. --No retroperitoneal lymphadenopathy. --No mesenteric lymphadenopathy. --No pelvic or inguinal lymphadenopathy. Reproductive: Unremarkable Other: No ascites or free air. The abdominal wall is normal. Musculoskeletal. Comminuted fractures again noted of the right acetabulum involving the anterior and posterior columns. There is a moderate-sized pelvic hematoma. There is a probable acute nondisplaced fracture of the inferior pubic ramus on the right. There  is a chronic appearing there is a chronic appearing compression fracture of the L5 vertebral body. IMPRESSION: 1. No acute traumatic abnormality detected involving the patient's thorax. 2. Again noted are acute pelvic fractures as previously described with a moderate-sized pelvic hematoma. 3. There is an age-indeterminate compression fracture of the superior endplate of the T5 vertebral body. Correlation with point tenderness is recommended. There are additional chronic appearing thoracic spine compression fractures. 4. There is a 1 cm ground-glass airspace opacity in the right upper lobe. Initial follow-up with CT at 6-12 months is recommended to confirm persistence. If persistent, repeat CT is recommended every 2 years until 5 years of stability has been established. This recommendation follows the consensus statement: Guidelines for Management of Incidental Pulmonary Nodules Detected on CT Images: From the Fleischner Society 2017; Radiology 2017; 284:228-243. 5. Rectosigmoid diverticulosis without acute inflammation. 6. Nonobstructive right nephrolithiasis. Aortic Atherosclerosis (ICD10-I70.0). Electronically Signed   By: Constance Holster M.D.   On: 04/14/2019 21:22   CT Cervical Spine Wo Contrast  Result Date: 04/14/2019 CLINICAL DATA:  85 year old female with head trauma. EXAM: CT HEAD WITHOUT CONTRAST CT MAXILLOFACIAL WITHOUT CONTRAST CT CERVICAL SPINE WITHOUT CONTRAST TECHNIQUE: Multidetector CT imaging of the head, cervical spine, and maxillofacial structures were performed using the standard protocol without intravenous contrast. Multiplanar CT image reconstructions of the cervical spine and maxillofacial structures were also generated. COMPARISON:  Head CT dated 02/03/2019. FINDINGS: CT HEAD FINDINGS Brain: Mild age-related atrophy and chronic microvascular ischemic changes. Subcentimeter bilateral basal ganglia old lacunar infarcts. There is a small right temporoparietal subdural hemorrhage measuring  up to 3 mm in thickness. Tiny linear high attenuation along the right frontal cortex (26/5, 19/7) may be artifactual or represent focal cortical contusion. No mass effect or midline shift. Vascular: No hyperdense vessel or unexpected calcification. 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 Skull: No acute calvarial pathology. Prior left temporal craniotomy. Other: Small right periorbital contusion. CT MAXILLOFACIAL FINDINGS Osseous: There is a depressed angulated fracture of the right zygomatic arch. Mildly angulated fractures of the posterolateral wall of the right maxillary sinus as well as nondisplaced fracture of the anterior wall of the  right maxillary sinus. There is mild sutural diastasis of the lateral wall of the right orbit. There is a fracture of the right orbital floor. No other acute fracture. No mandibular dislocation. Orbits: The globes and retro-orbital fat are preserved. Sinuses: There is complete opacification of the right maxillary sinus consistent with hemosinus. No air-fluid level. The mastoid air cells are clear. Soft tissues: Mild right facial contusion. CT CERVICAL SPINE FINDINGS Alignment: No acute subluxation. Skull base and vertebrae: No acute fracture. Osteopenia. Soft tissues and spinal canal: No prevertebral fluid or swelling. No visible canal hematoma. Disc levels: Degenerative changes primarily at C5-C6 with disc space narrowing and bone spurring. There is grade 1 C4-C5 anterolisthesis. Upper chest: Negative. Other: None IMPRESSION: 1. Small right temporoparietal subdural hemorrhage. No mass effect or midline shift. 2. No acute/traumatic cervical spine pathology. 3. Right facial bone fractures as above. These results were called by telephone at the time of interpretation on 04/14/2019 at 6:07 pm to provider [facility] , who verbally acknowledged these results. Electronically Signed   By: Anner Crete M.D.   On: 04/14/2019 18:23   CT PELVIS WO CONTRAST  Result Date:  04/14/2019 CLINICAL DATA:  Pelvic trauma EXAM: CT PELVIS WITHOUT CONTRAST TECHNIQUE: Multidetector CT imaging of the pelvis was performed following the standard protocol without intravenous contrast. COMPARISON:  Plain films today FINDINGS: Urinary Tract:  No abnormality visualized. Bowel:  Sigmoid diverticulosis.  No active diverticulitis. Vascular/Lymphatic: Aortoiliac atherosclerosis.  No adenopathy. Reproductive:  Uterus and adnexa unremarkable.  No mass. Other: Right pelvic hematoma noted within the right extraperitoneal space, displacing the bladder to the left. Enlargement of right pelvic abductor muscles compatible with intramuscular hematomas. Musculoskeletal: Fractures noted through the right acetabulum and involving the anterior, medial and posterior walls. Fracture through the right superior pubic ramus and inferior pubic ramus. Remote posttraumatic changes in the proximal right femur with internal fixation. IMPRESSION: Fractures through the right acetabulum involving the anterior, medial and posterior walls. Fracture through the lateral right superior pubic ramus and the inferior pubic ramus. Remote posttraumatic and postsurgical changes in the right femur. Right pelvic intramuscular and extraperitoneal hematoma displacing the bladder to the left. Sigmoid diverticulosis. Aortoiliac atherosclerosis. Electronically Signed   By: Rolm Baptise M.D.   On: 04/14/2019 18:01   CT Maxillofacial WO CM  Result Date: 04/14/2019 CLINICAL DATA:  85 year old female with head trauma. EXAM: CT HEAD WITHOUT CONTRAST CT MAXILLOFACIAL WITHOUT CONTRAST CT CERVICAL SPINE WITHOUT CONTRAST TECHNIQUE: Multidetector CT imaging of the head, cervical spine, and maxillofacial structures were performed using the standard protocol without intravenous contrast. Multiplanar CT image reconstructions of the cervical spine and maxillofacial structures were also generated. COMPARISON:  Head CT dated 02/03/2019. FINDINGS: CT HEAD FINDINGS  Brain: Mild age-related atrophy and chronic microvascular ischemic changes. Subcentimeter bilateral basal ganglia old lacunar infarcts. There is a small right temporoparietal subdural hemorrhage measuring up to 3 mm in thickness. Tiny linear high attenuation along the right frontal cortex (26/5, 19/7) may be artifactual or represent focal cortical contusion. No mass effect or midline shift. Vascular: No hyperdense vessel or unexpected calcification. 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 Skull: No acute calvarial pathology. Prior left temporal craniotomy. Other: Small right periorbital contusion. CT MAXILLOFACIAL FINDINGS Osseous: There is a depressed angulated fracture of the right zygomatic arch. Mildly angulated fractures of the posterolateral wall of the right maxillary sinus as well as nondisplaced fracture of the anterior wall of the right maxillary sinus. There is mild sutural diastasis of the  lateral wall of the right orbit. There is a fracture of the right orbital floor. No other acute fracture. No mandibular dislocation. Orbits: The globes and retro-orbital fat are preserved. Sinuses: There is complete opacification of the right maxillary sinus consistent with hemosinus. No air-fluid level. The mastoid air cells are clear. Soft tissues: Mild right facial contusion. CT CERVICAL SPINE FINDINGS Alignment: No acute subluxation. Skull base and vertebrae: No acute fracture. Osteopenia. Soft tissues and spinal canal: No prevertebral fluid or swelling. No visible canal hematoma. Disc levels: Degenerative changes primarily at C5-C6 with disc space narrowing and bone spurring. There is grade 1 C4-C5 anterolisthesis. Upper chest: Negative. Other: None IMPRESSION: 1. Small right temporoparietal subdural hemorrhage. No mass effect or midline shift. 2. No acute/traumatic cervical spine pathology. 3. Right facial bone fractures as above. These results were called by telephone at the time of interpretation  on 04/14/2019 at 6:07 pm to provider [facility] , who verbally acknowledged these results. Electronically Signed   By: Anner Crete M.D.   On: 04/14/2019 18:23    Scheduled Meds: . erythromycin  1 application Right Eye [id number]   Continuous Infusions: . dextrose 5 % and 0.9% NaCl 50 mL/hr at 04/14/19 2342  . doxycycline (VIBRAMYCIN) IV Stopped (04/15/19 1118)   PRN Meds: acetaminophen **OR** acetaminophen, atropine, diphenhydrAMINE, haloperidol **OR** haloperidol **OR** haloperidol lactate, HYDROmorphone (DILAUDID) injection, LORazepam **OR** LORazepam **OR** LORazepam, LORazepam, ondansetron **OR** ondansetron (ZOFRAN) IV, oxyCODONE **OR** oxyCODONE  Time spent: 35 minutes  Author: Berle Mull, MD [facility] 04/15/2019 5:39 PM  To reach On-call, see care teams to locate the attending and reach out to them via [email or web address]. If 7PM-7AM, please contact night-coverage If you still have difficulty reaching the attending provider, please page the [facility] (Director on Call) for [facility] on amion for assistance.

## 2019-04-15 NOTE — Progress Notes (Signed)
Refused bladder scan- becomes agitated.  Patient is in continent.

## 2019-04-15 NOTE — ED Notes (Signed)
Report to Cynthia RN

## 2019-04-15 NOTE — ED Notes (Signed)
Breakfast Ordered 

## 2019-04-15 NOTE — Consult Note (Signed)
Reason for Consult: Facial trauma Referring Physician: Dr. Berle Mull  [name] is an 85 y.o. female.  HPI: The patient is an 85 yrs old wf here for treatment after an unwitnessed fall at her nursing facility directly prior to arrival.  She has known advanced dementia.  She was seen 3 months ago for a right femur fracture that was treated with intramedullary nailing.  At this occurrence she is found to have a right subdural hematoma, facial fractures, right acetabular and pubic fracture and possible pneumonia for which antibiotics were started.  She is a known DNR and this was confirmed by the ED with the daughter.  Her recent covid test is reported negative.  She was very pleasant but confused.    The CT of the face showed:  Depressed angulated fracture of the right zygomatic arch.  Mildly angulated fractures of the posteriolateral wall of the right maxillary sinus and nondisplaced fracture of the anterior wall of the right maxillary sinus.  There is a mild sutural diastasis of the lateral wall of the right orbit and orbital floor.      Past Medical History:  Diagnosis Date  . Double vision    started 1st part of December 2013-Has  a Cerebral Aneurysm  . High cholesterol   . Hyperlipidemia   . Kidney stone     Past Surgical History:  Procedure Laterality Date  . BACK SURGERY    . CRANIOTOMY  01/23/2012   Procedure: CRANIOTOMY INTRACRANIAL ANEURYSM FOR CAROTID;  Surgeon: Winfield Cunas, MD;  Location: [facility];  Service: Neurosurgery;  Laterality: Left;  Left Pterional Craniotomy for aneurysm clipping  . INTRAMEDULLARY (IM) NAIL INTERTROCHANTERIC Right 02/04/2019   Procedure: INTRAMEDULLARY (IM) NAIL INTERTROCHANTRIC;  Surgeon: Hiram Gash, MD;  Location: [facility];  Service: Orthopedics;  Laterality: Right;  . Left wrist     fracture 02/2011  . ORIF ELBOW FRACTURE Right 02/09/2019   Procedure: OPEN REDUCTION INTERNAL FIXATION (ORIF) ELBOW/OLECRANON FRACTURE;  Surgeon: Hiram Gash, MD;  Location: [facility];  Service: Orthopedics;  Laterality: Right;  . TUBAL LIGATION      Family History  Family history unknown: Yes    Social History:  reports that she has never smoked. She has never used smokeless tobacco. She reports that she does not drink alcohol or use drugs.  Allergies: No Known Allergies  Medications: I have reviewed the patient's current medications.  Results for orders placed or performed during the hospital encounter of 04/14/19 (from the past 48 hour(s))  POC CBG, ED     Status: Abnormal   Collection Time: 04/14/19  4:26 PM  Result Value Ref Range   Glucose-Capillary 105 (H) 70 - 99 mg/dL    Comment: Glucose reference range applies only to samples taken after fasting for at least 8 hours.  Basic metabolic panel     Status: Abnormal   Collection Time: 04/14/19  4:33 PM  Result Value Ref Range   Sodium 140 135 - 145 mmol/L   Potassium 4.1 3.5 - 5.1 mmol/L   Chloride 106 98 - 111 mmol/L   CO2 25 22 - 32 mmol/L   Glucose, Bld 115 (H) 70 - 99 mg/dL    Comment: Glucose reference range applies only to samples taken after fasting for at least 8 hours.   BUN 13 8 - 23 mg/dL   Creatinine, Ser 0.59 0.44 - 1.00 mg/dL   Calcium 8.9 8.9 - 10.3 mg/dL   GFR calc non  Af Amer >60 >60 mL/min   GFR calc Af Amer >60 >60 mL/min   Anion gap 9 5 - 15    Comment: Performed at [facility] [address], [place], [place] [address]  CBC with Differential     Status: Abnormal   Collection Time: 04/14/19  4:33 PM  Result Value Ref Range   WBC 9.0 4.0 - 10.5 K/uL   RBC 4.23 3.87 - 5.11 MIL/uL   Hemoglobin 13.0 12.0 - 15.0 g/dL   HCT 42.1 36.0 - 46.0 %   MCV 99.5 80.0 - 100.0 fL   MCH 30.7 26.0 - 34.0 pg   MCHC 30.9 30.0 - 36.0 g/dL   RDW 13.6 11.5 - 15.5 %   Platelets 408 (H) 150 - 400 K/uL   nRBC 0.0 0.0 - 0.2 %   Neutrophils Relative % 70 %   Neutro Abs 6.3 1.7 - 7.7 K/uL   Lymphocytes Relative 20 %   Lymphs Abs 1.8 0.7 - 4.0 K/uL   Monocytes Relative  8 %   Monocytes Absolute 0.8 0.1 - 1.0 K/uL   Eosinophils Relative 1 %   Eosinophils Absolute 0.1 0.0 - 0.5 K/uL   Basophils Relative 0 %   Basophils Absolute 0.0 0.0 - 0.1 K/uL   Immature Granulocytes 1 %   Abs Immature Granulocytes 0.08 (H) 0.00 - 0.07 K/uL    Comment: Performed at [facility] [address] [place], [place] [address]  Troponin I (High Sensitivity)     Status: None   Collection Time: 04/14/19  4:39 PM  Result Value Ref Range   Troponin I (High Sensitivity) 4 <18 ng/L    Comment: (NOTE) Elevated high sensitivity troponin I (hsTnI) values and significant  changes across serial measurements may suggest ACS but many other  chronic and acute conditions are known to elevate hsTnI results.  Refer to the "Links" section for chest pain algorithms and additional  guidance. Performed at [facility], [place] [address], [place], [place] [address]   Troponin I (High Sensitivity)     Status: None   Collection Time: 04/14/19  6:50 PM  Result Value Ref Range   Troponin I (High Sensitivity) 5 <18 ng/L    Comment: (NOTE) Elevated high sensitivity troponin I (hsTnI) values and significant  changes across serial measurements may suggest ACS but many other  chronic and acute conditions are known to elevate hsTnI results.  Refer to the "Links" section for chest pain algorithms and additional  guidance. Performed at [facility], [place] [address], [place], [place] [address]   CBC with Differential/Platelet     Status: Abnormal   Collection Time: 04/14/19  7:38 PM  Result Value Ref Range   WBC 17.9 (H) 4.0 - 10.5 K/uL   RBC 4.14 3.87 - 5.11 MIL/uL   Hemoglobin 12.7 12.0 - 15.0 g/dL   HCT 40.7 36.0 - 46.0 %   MCV 98.3 80.0 - 100.0 fL   MCH 30.7 26.0 - 34.0 pg   MCHC 31.2 30.0 - 36.0 g/dL   RDW 13.4 11.5 - 15.5 %   Platelets 382 150 - 400 K/uL   nRBC 0.0 0.0 - 0.2 %   Neutrophils Relative % 86 %   Neutro Abs 15.4 (H) 1.7 - 7.7 K/uL   Lymphocytes Relative 5 %    Lymphs Abs 0.9 0.7 - 4.0 K/uL   Monocytes Relative 8 %   Monocytes Absolute 1.5 (H) 0.1 - 1.0  K/uL   Eosinophils Relative 0 %   Eosinophils Absolute 0.0 0.0 - 0.5 K/uL   Basophils Relative 0 %   Basophils Absolute 0.0 0.0 - 0.1 K/uL   Immature Granulocytes 1 %   Abs Immature Granulocytes 0.11 (H) 0.00 - 0.07 K/uL    Comment: Performed at [facility] [address], [place], [place] [address]  SARS CORONAVIRUS 2 (TAT 6-24 HRS) Nasopharyngeal Nasopharyngeal Swab     Status: None   Collection Time: 04/14/19  7:47 PM   Specimen: Nasopharyngeal Swab  Result Value Ref Range   SARS Coronavirus 2 NEGATIVE NEGATIVE    Comment: (NOTE) SARS-CoV-2 target nucleic acids are NOT DETECTED. The SARS-CoV-2 RNA is generally detectable in upper and lower respiratory specimens during the acute phase of infection. Negative results do not preclude SARS-CoV-2 infection, do not rule out co-infections with other pathogens, and should not be used as the sole basis for treatment or other patient management decisions. Negative results must be combined with clinical observations, patient history, and epidemiological information. The expected result is Negative. Fact Sheet for Patients: SugarRoll.be Fact Sheet for Healthcare Providers: https://www.woods-mathews.com/ This test is not yet approved or cleared by the [place] FDA and  has been authorized for detection and/or diagnosis of SARS-CoV-2 by FDA under an Emergency Use Authorization (EUA). This EUA will remain  in effect (meaning this test can be used) for the duration of the COVID-19 declaration under Section 56 4(b)(1) of the Act, 21 U.S.C. section 360bbb-3(b)(1), unless the authorization is terminated or revoked sooner. Performed at [facility], [place] [address] [place], [place] [address]   CBG monitoring, ED     Status: Abnormal   Collection Time: 04/14/19 11:39 PM  Result Value Ref  Range   Glucose-Capillary 147 (H) 70 - 99 mg/dL    Comment: Glucose reference range applies only to samples taken after fasting for at least 8 hours.  CBG monitoring, ED     Status: Abnormal   Collection Time: 04/15/19  8:03 AM  Result Value Ref Range   Glucose-Capillary 110 (H) 70 - 99 mg/dL    Comment: Glucose reference range applies only to samples taken after fasting for at least 8 hours.  Comprehensive metabolic panel     Status: Abnormal   Collection Time: 04/15/19  8:17 AM  Result Value Ref Range   Sodium 139 135 - 145 mmol/L   Potassium 4.0 3.5 - 5.1 mmol/L   Chloride 104 98 - 111 mmol/L   CO2 27 22 - 32 mmol/L   Glucose, Bld 128 (H) 70 - 99 mg/dL    Comment: Glucose reference range applies only to samples taken after fasting for at least 8 hours.   BUN 16 8 - 23 mg/dL   Creatinine, Ser 0.59 0.44 - 1.00 mg/dL   Calcium 8.6 (L) 8.9 - 10.3 mg/dL   Total Protein 6.3 (L) 6.5 - 8.1 g/dL   Albumin 2.9 (L) 3.5 - 5.0 g/dL   AST 21 15 - 41 U/L   ALT 18 0 - 44 U/L   Alkaline Phosphatase 233 (H) 38 - 126 U/L   Total Bilirubin 0.6 0.3 - 1.2 mg/dL   GFR calc non Af Amer >60 >60 mL/min   GFR calc Af Amer >60 >60 mL/min   Anion gap 8 5 - 15    Comment: Performed at [facility] [address], [place], [place] [address]  CBC     Status: Abnormal  Collection Time: 04/15/19  8:17 AM  Result Value Ref Range   WBC 13.5 (H) 4.0 - 10.5 K/uL   RBC 3.70 (L) 3.87 - 5.11 MIL/uL   Hemoglobin 11.5 (L) 12.0 - 15.0 g/dL   HCT 36.6 36.0 - 46.0 %   MCV 98.9 80.0 - 100.0 fL   MCH 31.1 26.0 - 34.0 pg   MCHC 31.4 30.0 - 36.0 g/dL   RDW 13.7 11.5 - 15.5 %   Platelets 335 150 - 400 K/uL   nRBC 0.0 0.0 - 0.2 %    Comment: Performed at [facility], [place] [address] [place], [place] [address]  Procalcitonin - Baseline     Status: None   Collection Time: 04/15/19  8:17 AM  Result Value Ref Range   Procalcitonin 1.72 ng/mL    Comment:        Interpretation: PCT > 0.5 ng/mL and <= 2  ng/mL: Systemic infection (sepsis) is possible, but other conditions are known to elevate PCT as well. (NOTE)       Sepsis PCT Algorithm           Lower Respiratory Tract                                      Infection PCT Algorithm    ----------------------------     ----------------------------         PCT < 0.25 ng/mL                PCT < 0.10 ng/mL         Strongly encourage             Strongly discourage   discontinuation of antibiotics    initiation of antibiotics    ----------------------------     -----------------------------       PCT 0.25 - 0.50 ng/mL            PCT 0.10 - 0.25 ng/mL               OR       >80% decrease in PCT            Discourage initiation of                                            antibiotics      Encourage discontinuation           of antibiotics    ----------------------------     -----------------------------         PCT >= 0.50 ng/mL              PCT 0.26 - 0.50 ng/mL                AND       <80% decrease in PCT             Encourage initiation of                                             antibiotics       Encourage continuation           of antibiotics    ----------------------------     -----------------------------  PCT >= 0.50 ng/mL                  PCT > 0.50 ng/mL               AND         increase in PCT                  Strongly encourage                                      initiation of antibiotics    Strongly encourage escalation           of antibiotics                                     -----------------------------                                           PCT <= 0.25 ng/mL                                                 OR                                        > 80% decrease in PCT                                     Discontinue / Do not initiate                                             antibiotics Performed at [facility], [address] [place], [place] [address]     CT ABDOMEN PELVIS WO  CONTRAST  Result Date: 04/14/2019 CLINICAL DATA:  Acute pain due to trauma EXAM: CT CHEST, ABDOMEN AND PELVIS WITHOUT CONTRAST TECHNIQUE: Multidetector CT imaging of the chest, abdomen and pelvis was performed following the standard protocol without IV contrast. COMPARISON:  CT pelvis from the same day. FINDINGS: CT CHEST FINDINGS Cardiovascular: The heart size is essentially normal. There is no evidence for thoracic aortic aneurysm. Coronary artery calcifications are noted. There is no significant pericardial effusion. Mediastinum/Nodes: --No mediastinal or hilar lymphadenopathy. --No axillary lymphadenopathy. --No supraclavicular lymphadenopathy. --Normal thyroid gland. --The esophagus is unremarkable Lungs/Pleura: There is a 1 cm ground-glass airspace opacity in the right upper lobe (axial series 6, image 26). There is no pneumothorax or large focal infiltrate. There is some atelectasis at the lung bases, right worse than left. Musculoskeletal: There are old bilateral right-sided rib fractures. There is no acute displaced fracture. There is sclerosis of the superior endplate of the T5 vertebral body. There is mild height loss of the T5 vertebral body. There are chronic appearing compression fractures of the T11 and T7 vertebral bodies. CT ABDOMEN PELVIS  FINDINGS Hepatobiliary: The liver is normal. Normal gallbladder.There is no biliary ductal dilation. Pancreas: Normal contours without ductal dilatation. No peripancreatic fluid collection. Spleen: Unremarkable. Adrenals/Urinary Tract: --Adrenal glands: Unremarkable. --Right kidney/ureter: There are punctate nonobstructing stones in the lower pole the right kidney. --Left kidney/ureter: No hydronephrosis or radiopaque kidney stones. --Urinary bladder: Unremarkable. Stomach/Bowel: --Stomach/Duodenum: No hiatal hernia or other gastric abnormality. Normal duodenal course and caliber. --Small bowel: Unremarkable. --Colon: Rectosigmoid diverticulosis without acute  inflammation. --Appendix: Not visualized. No right lower quadrant inflammation or free fluid. Vascular/Lymphatic: Atherosclerotic calcification is present within the non-aneurysmal abdominal aorta, without hemodynamically significant stenosis. --No retroperitoneal lymphadenopathy. --No mesenteric lymphadenopathy. --No pelvic or inguinal lymphadenopathy. Reproductive: Unremarkable Other: No ascites or free air. The abdominal wall is normal. Musculoskeletal. Comminuted fractures again noted of the right acetabulum involving the anterior and posterior columns. There is a moderate-sized pelvic hematoma. There is a probable acute nondisplaced fracture of the inferior pubic ramus on the right. There is a chronic appearing there is a chronic appearing compression fracture of the L5 vertebral body. IMPRESSION: 1. No acute traumatic abnormality detected involving the patient's thorax. 2. Again noted are acute pelvic fractures as previously described with a moderate-sized pelvic hematoma. 3. There is an age-indeterminate compression fracture of the superior endplate of the T5 vertebral body. Correlation with point tenderness is recommended. There are additional chronic appearing thoracic spine compression fractures. 4. There is a 1 cm ground-glass airspace opacity in the right upper lobe. Initial follow-up with CT at 6-12 months is recommended to confirm persistence. If persistent, repeat CT is recommended every 2 years until 5 years of stability has been established. This recommendation follows the consensus statement: Guidelines for Management of Incidental Pulmonary Nodules Detected on CT Images: From the Fleischner Society 2017; Radiology 2017; 284:228-243. 5. Rectosigmoid diverticulosis without acute inflammation. 6. Nonobstructive right nephrolithiasis. Aortic Atherosclerosis (ICD10-I70.0). Electronically Signed   By: Constance Holster M.D.   On: 04/14/2019 21:22   DG Chest 1 View  Result Date: 04/14/2019 CLINICAL  DATA:  Fall. EXAM: CHEST  1 VIEW COMPARISON:  02/04/2019 FINDINGS: Lungs are hyperexpanded. Interstitial markings are diffusely coarsened with chronic features. Patchy opacity identified over the peripheral right upper lung. No dense focal airspace consolidation or pleural effusion. The cardiopericardial silhouette is within normal limits for size. Skin folds overlie the left hemithorax. Nodular density/densities projecting over the right parahilar lung is compatible with pad for telemetry leads. Bones are diffusely demineralized. IMPRESSION: 1. Hyperexpansion suggests emphysema. 2. Subtle ill-defined nodular density peripheral right upper lobe. This may be infectious/inflammatory. Follow-up imaging recommended to ensure resolution. Electronically Signed   By: Misty Stanley M.D.   On: 04/14/2019 17:40   DG Lumbar Spine Complete  Result Date: 04/14/2019 CLINICAL DATA:  Fall, pain EXAM: LUMBAR SPINE - COMPLETE 4+ VIEW COMPARISON:  Two-view chest x-ray 02/03/2019. FINDINGS: Diffuse osteopenia. Mild compression deformity at L1 and T11 and T10. The T10 fracture appears stable when compared to prior chest x-ray. The other levels are not well visualized on the prior chest x-ray. No malalignment. Diffuse degenerative disc and facet disease. IMPRESSION: Age-indeterminate mild compression fractures at T11 and L1. Chronic compression fracture at T10. Diffuse osteopenia. Electronically Signed   By: Rolm Baptise M.D.   On: 04/14/2019 17:43   CT Head Wo Contrast  Result Date: 04/14/2019 CLINICAL DATA:  85 year old female with head trauma. EXAM: CT HEAD WITHOUT CONTRAST CT MAXILLOFACIAL WITHOUT CONTRAST CT CERVICAL SPINE WITHOUT CONTRAST TECHNIQUE: Multidetector CT imaging of the head, cervical spine, and  maxillofacial structures were performed using the standard protocol without intravenous contrast. Multiplanar CT image reconstructions of the cervical spine and maxillofacial structures were also generated. COMPARISON:   Head CT dated 02/03/2019. FINDINGS: CT HEAD FINDINGS Brain: Mild age-related atrophy and chronic microvascular ischemic changes. Subcentimeter bilateral basal ganglia old lacunar infarcts. There is a small right temporoparietal subdural hemorrhage measuring up to 3 mm in thickness. Tiny linear high attenuation along the right frontal cortex (26/5, 19/7) may be artifactual or represent focal cortical contusion. No mass effect or midline shift. Vascular: No hyperdense vessel or unexpected calcification. 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 Skull: No acute calvarial pathology. Prior left temporal craniotomy. Other: Small right periorbital contusion. CT MAXILLOFACIAL FINDINGS Osseous: There is a depressed angulated fracture of the right zygomatic arch. Mildly angulated fractures of the posterolateral wall of the right maxillary sinus as well as nondisplaced fracture of the anterior wall of the right maxillary sinus. There is mild sutural diastasis of the lateral wall of the right orbit. There is a fracture of the right orbital floor. No other acute fracture. No mandibular dislocation. Orbits: The globes and retro-orbital fat are preserved. Sinuses: There is complete opacification of the right maxillary sinus consistent with hemosinus. No air-fluid level. The mastoid air cells are clear. Soft tissues: Mild right facial contusion. CT CERVICAL SPINE FINDINGS Alignment: No acute subluxation. Skull base and vertebrae: No acute fracture. Osteopenia. Soft tissues and spinal canal: No prevertebral fluid or swelling. No visible canal hematoma. Disc levels: Degenerative changes primarily at C5-C6 with disc space narrowing and bone spurring. There is grade 1 C4-C5 anterolisthesis. Upper chest: Negative. Other: None IMPRESSION: 1. Small right temporoparietal subdural hemorrhage. No mass effect or midline shift. 2. No acute/traumatic cervical spine pathology. 3. Right facial bone fractures as above. These results  were called by telephone at the time of interpretation on 04/14/2019 at 6:07 pm to provider [facility] , who verbally acknowledged these results. Electronically Signed   By: Anner Crete M.D.   On: 04/14/2019 18:23   CT Chest Wo Contrast  Result Date: 04/14/2019 CLINICAL DATA:  Acute pain due to trauma EXAM: CT CHEST, ABDOMEN AND PELVIS WITHOUT CONTRAST TECHNIQUE: Multidetector CT imaging of the chest, abdomen and pelvis was performed following the standard protocol without IV contrast. COMPARISON:  CT pelvis from the same day. FINDINGS: CT CHEST FINDINGS Cardiovascular: The heart size is essentially normal. There is no evidence for thoracic aortic aneurysm. Coronary artery calcifications are noted. There is no significant pericardial effusion. Mediastinum/Nodes: --No mediastinal or hilar lymphadenopathy. --No axillary lymphadenopathy. --No supraclavicular lymphadenopathy. --Normal thyroid gland. --The esophagus is unremarkable Lungs/Pleura: There is a 1 cm ground-glass airspace opacity in the right upper lobe (axial series 6, image 26). There is no pneumothorax or large focal infiltrate. There is some atelectasis at the lung bases, right worse than left. Musculoskeletal: There are old bilateral right-sided rib fractures. There is no acute displaced fracture. There is sclerosis of the superior endplate of the T5 vertebral body. There is mild height loss of the T5 vertebral body. There are chronic appearing compression fractures of the T11 and T7 vertebral bodies. CT ABDOMEN PELVIS FINDINGS Hepatobiliary: The liver is normal. Normal gallbladder.There is no biliary ductal dilation. Pancreas: Normal contours without ductal dilatation. No peripancreatic fluid collection. Spleen: Unremarkable. Adrenals/Urinary Tract: --Adrenal glands: Unremarkable. --Right kidney/ureter: There are punctate nonobstructing stones in the lower pole the right kidney. --Left kidney/ureter: No hydronephrosis or radiopaque kidney stones.  --Urinary bladder: Unremarkable. Stomach/Bowel: --Stomach/Duodenum:  No hiatal hernia or other gastric abnormality. Normal duodenal course and caliber. --Small bowel: Unremarkable. --Colon: Rectosigmoid diverticulosis without acute inflammation. --Appendix: Not visualized. No right lower quadrant inflammation or free fluid. Vascular/Lymphatic: Atherosclerotic calcification is present within the non-aneurysmal abdominal aorta, without hemodynamically significant stenosis. --No retroperitoneal lymphadenopathy. --No mesenteric lymphadenopathy. --No pelvic or inguinal lymphadenopathy. Reproductive: Unremarkable Other: No ascites or free air. The abdominal wall is normal. Musculoskeletal. Comminuted fractures again noted of the right acetabulum involving the anterior and posterior columns. There is a moderate-sized pelvic hematoma. There is a probable acute nondisplaced fracture of the inferior pubic ramus on the right. There is a chronic appearing there is a chronic appearing compression fracture of the L5 vertebral body. IMPRESSION: 1. No acute traumatic abnormality detected involving the patient's thorax. 2. Again noted are acute pelvic fractures as previously described with a moderate-sized pelvic hematoma. 3. There is an age-indeterminate compression fracture of the superior endplate of the T5 vertebral body. Correlation with point tenderness is recommended. There are additional chronic appearing thoracic spine compression fractures. 4. There is a 1 cm ground-glass airspace opacity in the right upper lobe. Initial follow-up with CT at 6-12 months is recommended to confirm persistence. If persistent, repeat CT is recommended every 2 years until 5 years of stability has been established. This recommendation follows the consensus statement: Guidelines for Management of Incidental Pulmonary Nodules Detected on CT Images: From the Fleischner Society 2017; Radiology 2017; 284:228-243. 5. Rectosigmoid diverticulosis without  acute inflammation. 6. Nonobstructive right nephrolithiasis. Aortic Atherosclerosis (ICD10-I70.0). Electronically Signed   By: Constance Holster M.D.   On: 04/14/2019 21:22   CT Cervical Spine Wo Contrast  Result Date: 04/14/2019 CLINICAL DATA:  85 year old female with head trauma. EXAM: CT HEAD WITHOUT CONTRAST CT MAXILLOFACIAL WITHOUT CONTRAST CT CERVICAL SPINE WITHOUT CONTRAST TECHNIQUE: Multidetector CT imaging of the head, cervical spine, and maxillofacial structures were performed using the standard protocol without intravenous contrast. Multiplanar CT image reconstructions of the cervical spine and maxillofacial structures were also generated. COMPARISON:  Head CT dated 02/03/2019. FINDINGS: CT HEAD FINDINGS Brain: Mild age-related atrophy and chronic microvascular ischemic changes. Subcentimeter bilateral basal ganglia old lacunar infarcts. There is a small right temporoparietal subdural hemorrhage measuring up to 3 mm in thickness. Tiny linear high attenuation along the right frontal cortex (26/5, 19/7) may be artifactual or represent focal cortical contusion. No mass effect or midline shift. Vascular: No hyperdense vessel or unexpected calcification. 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 Skull: No acute calvarial pathology. Prior left temporal craniotomy. Other: Small right periorbital contusion. CT MAXILLOFACIAL FINDINGS Osseous: There is a depressed angulated fracture of the right zygomatic arch. Mildly angulated fractures of the posterolateral wall of the right maxillary sinus as well as nondisplaced fracture of the anterior wall of the right maxillary sinus. There is mild sutural diastasis of the lateral wall of the right orbit. There is a fracture of the right orbital floor. No other acute fracture. No mandibular dislocation. Orbits: The globes and retro-orbital fat are preserved. Sinuses: There is complete opacification of the right maxillary sinus consistent with hemosinus. No  air-fluid level. The mastoid air cells are clear. Soft tissues: Mild right facial contusion. CT CERVICAL SPINE FINDINGS Alignment: No acute subluxation. Skull base and vertebrae: No acute fracture. Osteopenia. Soft tissues and spinal canal: No prevertebral fluid or swelling. No visible canal hematoma. Disc levels: Degenerative changes primarily at C5-C6 with disc space narrowing and bone spurring. There is grade 1 C4-C5 anterolisthesis. Upper chest: Negative. Other:  None IMPRESSION: 1. Small right temporoparietal subdural hemorrhage. No mass effect or midline shift. 2. No acute/traumatic cervical spine pathology. 3. Right facial bone fractures as above. These results were called by telephone at the time of interpretation on 04/14/2019 at 6:07 pm to provider [facility] , who verbally acknowledged these results. Electronically Signed   By: Anner Crete M.D.   On: 04/14/2019 18:23   CT PELVIS WO CONTRAST  Result Date: 04/14/2019 CLINICAL DATA:  Pelvic trauma EXAM: CT PELVIS WITHOUT CONTRAST TECHNIQUE: Multidetector CT imaging of the pelvis was performed following the standard protocol without intravenous contrast. COMPARISON:  Plain films today FINDINGS: Urinary Tract:  No abnormality visualized. Bowel:  Sigmoid diverticulosis.  No active diverticulitis. Vascular/Lymphatic: Aortoiliac atherosclerosis.  No adenopathy. Reproductive:  Uterus and adnexa unremarkable.  No mass. Other: Right pelvic hematoma noted within the right extraperitoneal space, displacing the bladder to the left. Enlargement of right pelvic abductor muscles compatible with intramuscular hematomas. Musculoskeletal: Fractures noted through the right acetabulum and involving the anterior, medial and posterior walls. Fracture through the right superior pubic ramus and inferior pubic ramus. Remote posttraumatic changes in the proximal right femur with internal fixation. IMPRESSION: Fractures through the right acetabulum involving the anterior, medial  and posterior walls. Fracture through the lateral right superior pubic ramus and the inferior pubic ramus. Remote posttraumatic and postsurgical changes in the right femur. Right pelvic intramuscular and extraperitoneal hematoma displacing the bladder to the left. Sigmoid diverticulosis. Aortoiliac atherosclerosis. Electronically Signed   By: Rolm Baptise M.D.   On: 04/14/2019 18:01   DG Hips Bilat W or Wo Pelvis 3-4 Views  Result Date: 04/14/2019 CLINICAL DATA:  85 year old female with fall. EXAM: DG HIP (WITH OR WITHOUT PELVIS) 3-4V BILAT COMPARISON:  Right hip radiograph dated 05/11/2019. FINDINGS: Evaluation is limited due to osteopenia and patient positioning and soft tissue attenuation. There is a mildly displaced acute fracture of the right acetabulum, new since the prior radiograph. There is a minimally displaced acute fracture of the right inferior pubic ramus. Comminuted fracture of the right femoral neck status post prior internal fixation. The hardware is intact. No other acute fracture identified. There is no dislocation. Degenerative changes of the lower lumbar spine. The soft tissues are unremarkable. IMPRESSION: Mildly displaced acute fracture of the right acetabulum and right inferior pubic ramus. Electronically Signed   By: Anner Crete M.D.   On: 04/14/2019 17:41   CT Maxillofacial WO CM  Result Date: 04/14/2019 CLINICAL DATA:  85 year old female with head trauma. EXAM: CT HEAD WITHOUT CONTRAST CT MAXILLOFACIAL WITHOUT CONTRAST CT CERVICAL SPINE WITHOUT CONTRAST TECHNIQUE: Multidetector CT imaging of the head, cervical spine, and maxillofacial structures were performed using the standard protocol without intravenous contrast. Multiplanar CT image reconstructions of the cervical spine and maxillofacial structures were also generated. COMPARISON:  Head CT dated 02/03/2019. FINDINGS: CT HEAD FINDINGS Brain: Mild age-related atrophy and chronic microvascular ischemic changes. Subcentimeter  bilateral basal ganglia old lacunar infarcts. There is a small right temporoparietal subdural hemorrhage measuring up to 3 mm in thickness. Tiny linear high attenuation along the right frontal cortex (26/5, 19/7) may be artifactual or represent focal cortical contusion. No mass effect or midline shift. Vascular: No hyperdense vessel or unexpected calcification. 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 Skull: No acute calvarial pathology. Prior left temporal craniotomy. Other: Small right periorbital contusion. CT MAXILLOFACIAL FINDINGS Osseous: There is a depressed angulated fracture of the right zygomatic arch. Mildly angulated fractures of the posterolateral wall of the  right maxillary sinus as well as nondisplaced fracture of the anterior wall of the right maxillary sinus. There is mild sutural diastasis of the lateral wall of the right orbit. There is a fracture of the right orbital floor. No other acute fracture. No mandibular dislocation. Orbits: The globes and retro-orbital fat are preserved. Sinuses: There is complete opacification of the right maxillary sinus consistent with hemosinus. No air-fluid level. The mastoid air cells are clear. Soft tissues: Mild right facial contusion. CT CERVICAL SPINE FINDINGS Alignment: No acute subluxation. Skull base and vertebrae: No acute fracture. Osteopenia. Soft tissues and spinal canal: No prevertebral fluid or swelling. No visible canal hematoma. Disc levels: Degenerative changes primarily at C5-C6 with disc space narrowing and bone spurring. There is grade 1 C4-C5 anterolisthesis. Upper chest: Negative. Other: None IMPRESSION: 1. Small right temporoparietal subdural hemorrhage. No mass effect or midline shift. 2. No acute/traumatic cervical spine pathology. 3. Right facial bone fractures as above. These results were called by telephone at the time of interpretation on 04/14/2019 at 6:07 pm to provider [facility] , who verbally acknowledged these results.  Electronically Signed   By: Anner Crete M.D.   On: 04/14/2019 18:23    Review of Systems  Unable to perform ROS: Dementia   Blood pressure 129/76, pulse 93, temperature 98.7 F (37.1 C), resp. rate 16, SpO2 100 %. Physical Exam  Constitutional: She appears well-developed and well-nourished.  HENT:  Head: Normocephalic.  Eyes: Pupils are equal, round, and reactive to light.  Cardiovascular: Normal rate.  Respiratory: Effort normal.  GI: Soft. She exhibits no distension.  Musculoskeletal:        General: Tenderness present.  Skin: Skin is warm.    Assessment/Plan:  Several facial fractures.  It is reasonable to treat conservatively considering her condition and age.  I recommend head of bed elevated as able.  Soft diet. Good oral hygiene.  Maximize nutritional status with protein drinks.  Follow up as able in the office in 2 weeks.  Antibiotic ointment to face.  [name] 04/15/2019, 4:39 PM

## 2019-04-16 MED ORDER — MORPHINE SULFATE (CONCENTRATE) 10 MG/0.5ML PO SOLN
10.0000 mg | Freq: Four times a day (QID) | ORAL | Status: DC
  Administered 2019-04-16 – 2019-04-17 (×3): 10 mg via ORAL
  Filled 2019-04-16 (×5): qty 0.5

## 2019-04-16 MED ORDER — QUETIAPINE FUMARATE 25 MG PO TABS
12.5000 mg | ORAL_TABLET | Freq: Every day | ORAL | Status: DC
  Administered 2019-04-16 – 2019-04-17 (×2): 12.5 mg via ORAL
  Filled 2019-04-16 (×2): qty 1

## 2019-04-16 MED ORDER — ENSURE ENLIVE PO LIQD
237.0000 mL | Freq: Two times a day (BID) | ORAL | Status: DC
  Administered 2019-04-16 – 2019-04-18 (×4): 237 mL via ORAL

## 2019-04-16 NOTE — Plan of Care (Signed)
  Problem: Coping: Goal: Level of anxiety will decrease Outcome: Progressing   Problem: Pain Managment: Goal: General experience of comfort will improve Outcome: Progressing   Problem: Safety: Goal: Ability to remain free from injury will improve Outcome: Progressing   

## 2019-04-16 NOTE — TOC Progression Note (Signed)
Transition of Care [facility]) - Progression Note    Patient Details  Name: [name] MRN: [id number] Date of Birth: [birth year]  Transition of Care [facility]) CM/SW [place], [place] Phone Number: [phone]  04/16/2019, 2:38 PM  Clinical Narrative:     CSW spoke to patient's daughter [name] about patient's discharge plan. [name] informed CSW that she did not want patient to return to [facility]. [name] inquired if she could speak with someone in regards to hospice care. CSW informed [name] that she would reach out to hospital liaison for hospice.  CSW alerted MD and hospital liaison.  TOC team will continue to follow for discharge planning needs.      Expected Discharge Plan and Services                                                 Social Determinants of Health (SDOH) Interventions    Readmission Risk Interventions No flowsheet data found.

## 2019-04-16 NOTE — Plan of Care (Signed)
  Problem: Safety: Goal: Ability to remain free from injury will improve Outcome: Progressing   

## 2019-04-16 NOTE — Plan of Care (Signed)
  Problem: Activity: Goal: Risk for activity intolerance will decrease Outcome: Progressing   Problem: Nutrition: Goal: Adequate nutrition will be maintained 04/16/2019 2237 by Claire Shown, RN Outcome: Progressing 04/16/2019 2237 by Claire Shown, RN Outcome: Progressing   Problem: Pain Managment: Goal: General experience of comfort will improve Outcome: Progressing   Problem: Safety: Goal: Ability to remain free from injury will improve 04/16/2019 2237 by Claire Shown, RN Outcome: Progressing 04/16/2019 2237 by Claire Shown, RN Outcome: Progressing   Problem: Skin Integrity: Goal: Risk for impaired skin integrity will decrease Outcome: Progressing

## 2019-04-16 NOTE — Progress Notes (Signed)
Ship broker received referral for pt to transfer to our [facility] in [place] for EOL care. Pt's chart currently in review by hospice staff to determine eligibility.   Writer spoke with pt's daughter in length to confirm interest. Answered all questions and explained hospice philosophy. Explained that [place] will inform her if pt is approved, as well as hospital staff.     Please do not hesitate to outreach with any questions.     Freddie Breech, RN  [facility] Liaison  [phone]

## 2019-04-16 NOTE — NC FL2 (Signed)
[place] LEVEL OF CARE SCREENING TOOL     IDENTIFICATION  Patient Name: [name] Birthdate: [birth year] Sex: female Admission Date (Current Location): 04/14/2019  [facility] and [place] Number:  Herbalist and Address:  [facility] [place] [place] [address], [place], [place] [address]      Provider Number: [id number]  Attending Physician Name and Address:  Lavina Hamman, MD  Relative Name and Phone Number:  [name] daughter, [phone]    Current Level of Care: Hospital Recommended Level of Care: [place] Prior Approval Number:    Date Approved/Denied:   PASRR Number: [id number]  Discharge Plan: SNF    Current Diagnoses: Patient Active Problem List   Diagnosis Date Noted  . SDH (subdural hematoma) ([place]) 04/15/2019  . Pelvic fracture ([place]) 04/14/2019  . Subdural hematoma ([place]) 04/14/2019  . Facial fracture ([place]) 04/14/2019  . Palliative care by specialist   . Goals of care, counseling/discussion   . Right hip pain   . Muscular weakness   . Delirium   . Displaced segmental fracture of shaft of right femur, initial encounter for closed fracture ([place]) 02/03/2019  . Dementia ([place]) 02/03/2019  . Cerebral aneurysm without rupture 01/23/2012  . Distal radius fracture, left 09/05/2011    Orientation RESPIRATION BLADDER Height & Weight     Self  Normal Incontinent, External catheter Weight:   Height:     BEHAVIORAL SYMPTOMS/MOOD NEUROLOGICAL BOWEL NUTRITION STATUS      Continent Diet(See dc summary)  AMBULATORY STATUS COMMUNICATION OF NEEDS Skin   Extensive Assist Verbally Normal                       Personal Care Assistance Level of Assistance  Bathing, Feeding, Dressing Bathing Assistance: Maximum assistance Feeding assistance: Limited assistance Dressing Assistance: Maximum assistance     Functional Limitations Info  Sight, Hearing, Speech Sight Info: Adequate Hearing Info:  Adequate Speech Info: Adequate    SPECIAL CARE FACTORS FREQUENCY                       Contractures Contractures Info: Not present    Additional Factors Info  Code Status, Allergies, Psychotropic Code Status Info: DNR Allergies Info: NKA Psychotropic Info: Seroquel         Current Medications (04/16/2019):  This is the current hospital active medication list Current Facility-Administered Medications  Medication Dose Route Frequency Provider Last Rate Last Admin  . acetaminophen (TYLENOL) tablet 650 mg  650 mg Oral Q6H PRN Lavina Hamman, MD   650 mg at 04/16/19 0151   Or  . acetaminophen (TYLENOL) suppository 650 mg  650 mg Rectal Q6H PRN Lavina Hamman, MD      . atropine 1 % ophthalmic solution 4 drop  4 drop Sublingual Q4H PRN Lavina Hamman, MD      . diphenhydrAMINE (BENADRYL) injection 12.5 mg  12.5 mg Intravenous Q4H PRN Lavina Hamman, MD      . doxycycline (VIBRAMYCIN) 100 mg in sodium chloride 0.9 % 250 mL IVPB  100 mg Intravenous BID Rise Patience, MD 125 mL/hr at 04/16/19 1013 100 mg at 04/16/19 1013  . erythromycin ophthalmic ointment 1 application  1 application Right Eye [id number] Dillingham, Loel Lofty, DO   1 application at [id number]  . feeding supplement (ENSURE ENLIVE) (ENSURE ENLIVE) liquid 237 mL  237 mL Oral BID BM Lavina Hamman, MD  237 mL at 04/16/19 1010  . haloperidol (HALDOL) tablet 2 mg  2 mg Oral Q4H PRN Lavina Hamman, MD       Or  . haloperidol (HALDOL) 2 MG/ML solution 2 mg  2 mg Sublingual Q4H PRN Lavina Hamman, MD       Or  . haloperidol lactate (HALDOL) injection 2 mg  2 mg Intravenous Q4H PRN Lavina Hamman, MD      . HYDROmorphone (DILAUDID) injection 0.5 mg  0.5 mg Intravenous Q2H PRN Lavina Hamman, MD      . LORazepam (ATIVAN) tablet 1 mg  1 mg Oral Q4H PRN Lavina Hamman, MD       Or  . LORazepam (ATIVAN) 2 MG/ML concentrated solution 1 mg  1 mg Sublingual Q4H PRN Lavina Hamman, MD       Or  . LORazepam (ATIVAN)  injection 1 mg  1 mg Intravenous Q4H PRN Lavina Hamman, MD      . LORazepam (ATIVAN) injection 1 mg  1 mg Intravenous Q4H PRN Lavina Hamman, MD      . morphine CONCENTRATE 10 MG/0.5ML oral solution 10 mg  10 mg Oral QID Lavina Hamman, MD   10 mg at 04/16/19 1009  . ondansetron (ZOFRAN-ODT) disintegrating tablet 4 mg  4 mg Oral Q6H PRN Lavina Hamman, MD       Or  . ondansetron [facility]) injection 4 mg  4 mg Intravenous Q6H PRN Lavina Hamman, MD      . oxyCODONE (ROXICODONE INTENSOL) 20 MG/ML concentrated solution 5 mg  5 mg Oral Q2H PRN Lavina Hamman, MD       Or  . oxyCODONE (ROXICODONE INTENSOL) 20 MG/ML concentrated solution 5 mg  5 mg Sublingual Q2H PRN Lavina Hamman, MD      . QUEtiapine (SEROQUEL) tablet 12.5 mg  12.5 mg Oral QHS Lavina Hamman, MD         Discharge Medications: Please see discharge summary for a list of discharge medications.  Relevant Imaging Results:  Relevant Lab Results:   Additional Information SSN: [id number]                 Covid negative on 4/8                          Add palliative care/hospice services at SNF as appropriate  Benard Halsted, LCSW

## 2019-04-16 NOTE — Progress Notes (Signed)
[facility] Progress Note  Patient: [name]    [id number]  DOA: 04/14/2019     Date of Service: the patient was seen and examined on 04/16/2019  Chief Complaint  Patient presents with  . Fall  . Laceration   Brief hospital course: [name] is a 85 y.o. female with history of advanced dementia who was admitted in January 2021 for right femur fracture underwent intramedullary nailing had a unwitnessed fall at the nursing facility and was brought to the ER.  As per the report patient was sitting on a chair and few minutes later was found on the floor.  Had right periorbital hematoma. Patient had multiple fractures and recurrent fall. Currently further plan is currently plan is to focus on comfort and pain control.  Assessment and Plan: 1.  Subdural hematoma Small, right subdural hematoma seen on the CT scan. No mass-effect. No significant focal deficit. Neurosurgery was consulted who recommended conservative measures and do not think that repeating a CT scan is also indicated at this point given her poor surgical candidacy.  2.  Right facial fracture, right orbital floor fracture Plastic surgery consulted appreciate input. Currently on recommends an ointment.  Monitor.  3.  Right pelvic fracture with acetabulum and pubic rami Orthopedic consulted. Currently recommendation is for conservative measures and nonweightbearing   4.  Right pelvic hematoma From the fall. Currently pressing on the bladder. Continue pain control for now.  5.  Goals of care discussion. Patient with failure to thrive and recurrent fall as well as dementia. Patient had a conversation with palliative care last admission and goal was to consider SNF for rehabilitation and if there is no improvement or benefit out of the rehabilitation consider hospice. Patient currently in with multiple fractures and multiple injury with pain as her primary complaint. Discussed with family recommended to  transition to a course of hospice and will comfort care. Family agreed to this recommendation. Daughter is concerned regarding uncontrolled pain with patient's agitation. I placed scheduled morphine order based on shared decision making. After receiving 2 doses of morphine daughter is still concerned regarding patient's pain control, reassured her that the patient does appear in pain control medications. Patient has no oral intake in last 24 hours.  Remains confused and agitated occasionally. Anticipating prognosis less than 2 weeks given her poor p.o. intake and confusion. Also at risk for infection given her multiple fractures.  6.  Emphysema Right upper lobe pneumonia Procalcitonin mildly elevated. Started on IV doxycycline. We will continue doxycycline for now and monitor response.  Diet: Dysphagia 3 diet DVT Prophylaxis: Comfort care  Advance goals of care discussion: Comfort care, DNR  Family Communication: no family was present at bedside, at the time of interview.  Discussed with daughter on the phone twice.  All questions answered satisfactorily.  Disposition:  Pt is from SNF, admitted with multiple fractures and SDH, still has multiple fractures with pain medication requirement, which precludes a safe discharge. Discharge to hospice.  Subjective: Patient significantly agitated in the morning.  No nausea no vomiting.  No fever no chills.  Physical Exam: General:  alert not oriented to time, place, and person.  Appear in moderate distress, affect anxious Eyes: PERRL ENT: Oral Mucosa Clear, moist  Neck: no JVD,  Cardiovascular: S1 and S2 Present, no Murmur,  Respiratory: increased respiratory effort, Bilateral Air entry equal and Decreased, no Crackles, no wheezes Abdomen: Bowel Sound present, Soft and difficult to assess  tenderness,  Skin: no rash  Extremities: no Pedal edema, no calf tenderness Neurologic: without any new focal findings  Gait not checked due to  patient safety concerns  Vitals:   04/15/19 1333 04/15/19 1943 04/16/19 0433 04/16/19 0847  BP: 129/76 122/87 124/81 (!) 143/81  Pulse: 93 95 86 91  Resp: 16   16  Temp: 98.7 F (37.1 C) 98.4 F (36.9 C) (!) 97.4 F (36.3 C) 98.1 F (36.7 C)  TempSrc:  Oral Oral Oral  SpO2: 100% 92% 95% 95%   No intake or output data in the 24 hours ending 04/16/19 1849 There were no vitals filed for this visit.  Data Reviewed: I have personally reviewed and interpreted daily labs, tele strips, imagings as discussed above. I reviewed all nursing notes, pharmacy notes, vitals, pertinent old records I have discussed plan of care as described above with RN and patient/family.  CBC: Recent Labs  Lab 04/14/19 1633 04/14/19 1938 04/15/19 0817  WBC 9.0 17.9* 13.5*  NEUTROABS 6.3 15.4*  --   HGB 13.0 12.7 11.5*  HCT 42.1 40.7 36.6  MCV 99.5 98.3 98.9  PLT 408* 382 [id number]   Basic Metabolic Panel: Recent Labs  Lab 04/14/19 1633 04/15/19 0817  NA 140 139  K 4.1 4.0  CL 106 104  CO2 25 27  GLUCOSE 115* 128*  BUN 13 16  CREATININE 0.59 0.59  CALCIUM 8.9 8.6*    Studies: No results found.  Scheduled Meds: . erythromycin  1 application Right Eye [id number]  . feeding supplement (ENSURE ENLIVE)  237 mL Oral BID BM  . morphine CONCENTRATE  10 mg Oral QID  . QUEtiapine  12.5 mg Oral QHS   Continuous Infusions:  PRN Meds: acetaminophen **OR** acetaminophen, atropine, diphenhydrAMINE, haloperidol **OR** haloperidol **OR** haloperidol lactate, HYDROmorphone (DILAUDID) injection, LORazepam **OR** LORazepam **OR** LORazepam, LORazepam, ondansetron **OR** ondansetron (ZOFRAN) IV, oxyCODONE **OR** oxyCODONE  Time spent: 35 minutes  Author: Berle Mull, MD [facility] 04/16/2019 6:49 PM  To reach On-call, see care teams to locate the attending and reach out to them via [email or web address]. If 7PM-7AM, please contact night-coverage If you still have difficulty reaching the attending provider,  please page the [facility] (Director on Call) for [facility] on amion for assistance.

## 2019-04-17 DIAGNOSIS — L8990 Pressure ulcer of unspecified site, unspecified stage: Secondary | ICD-10-CM | POA: Insufficient documentation

## 2019-04-17 MED ORDER — MORPHINE SULFATE (CONCENTRATE) 10 MG/0.5ML PO SOLN
10.0000 mg | Freq: Three times a day (TID) | ORAL | Status: DC
  Administered 2019-04-17 – 2019-04-18 (×3): 10 mg via ORAL
  Filled 2019-04-17 (×3): qty 0.5

## 2019-04-17 NOTE — Progress Notes (Signed)
[facility] does not have bed availability today but we are hopeful to have a bed available tomorrow. TOC and daughter, [name], updated.   Please reach out with any questions.   Thank you,  Freddie Breech, RN [facility] Liaison [phone]

## 2019-04-17 NOTE — Progress Notes (Signed)
[facility] Progress Note  Patient: [name]    [id number]  DOA: 04/14/2019     Date of Service: the patient was seen and examined on 04/17/2019  Chief Complaint  Patient presents with  . Fall  . Laceration   Brief hospital course: [name] is a 85 y.o. female with history of advanced dementia who was admitted in January 2021 for right femur fracture underwent intramedullary nailing had a unwitnessed fall at the nursing facility and was brought to the ER.  As per the report patient was sitting on a chair and few minutes later was found on the floor.  Had right periorbital hematoma. Patient had multiple fractures and recurrent fall. Currently further plan is currently plan is to focus on comfort and pain control.  Assessment and Plan: 1.  Subdural hematoma Small, right subdural hematoma seen on the CT scan. No mass-effect. No significant focal deficit. Neurosurgery was consulted who recommended conservative measures and do not think that repeating a CT scan is also indicated at this point given her poor surgical candidacy.  2.  Right facial fracture, right orbital floor fracture Plastic surgery consulted appreciate input. Currently on recommends an ointment.  Monitor.  3.  Right pelvic fracture with acetabulum and pubic rami Orthopedic consulted. Currently recommendation is for conservative measures and nonweightbearing   4.  Right pelvic hematoma From the fall. Currently pressing on the bladder. Continue pain control for now.  5.  Goals of care discussion. Patient with failure to thrive and recurrent fall as well as dementia. Patient had a conversation with palliative care last admission and goal was to consider SNF for rehabilitation and if there is no improvement or benefit out of the rehabilitation consider hospice. Patient currently in with multiple fractures and multiple injury with pain as her primary complaint. Discussed with family recommended to  transition to a course of hospice and will comfort care. Family agreed to this recommendation. Daughter is concerned regarding uncontrolled pain with patient's agitation. I placed scheduled morphine order based on shared decision making. We will reduce the frequency of morphine from 4 times daily to 3 times daily. Patient appears comfortable although remains confused and has no p.o. intake since admission. Anticipating prognosis less than 2 weeks given her poor p.o. intake and confusion. Also at risk for infection given her multiple fractures.  6.  Emphysema Right upper lobe pneumonia Procalcitonin mildly elevated. Started on IV doxycycline.  Diet: Dysphagia 3 diet DVT Prophylaxis: Comfort care  Advance goals of care discussion: Comfort care, DNR  Family Communication: family was present at bedside, at the time of interview. All questions answered satisfactorily.  Disposition:  Pt is from SNF, admitted with multiple fractures and SDH, still has multiple fractures with pain medication requirement, which precludes a safe discharge. Discharge to hospice.  Subjective: Patient appears comfortable. No nausea no vomiting. No fever no chills. Occasional agitation reported.  Physical Exam: General:  alert not oriented to time, place, and person.  Appear in mild distress, affect appropriate Eyes: PERRL ENT: Oral Mucosa Clear, moist  Neck: no JVD,  Cardiovascular: S1 and S2 Present, no Murmur,  Respiratory: increased respiratory effort, Bilateral Air entry equal and Decreased, no Crackles, no wheezes Abdomen: Bowel Sound present, Soft and difficult to assess  tenderness,  Skin: no rash Extremities: no Pedal edema, no calf tenderness Neurologic: without any new focal findings  Gait not checked due to patient safety concerns  Vitals:   04/16/19 0847 04/17/19 0456 04/17/19 0816 04/17/19  1612  BP: (!) 143/81 (!) 136/99 121/79 115/68  Pulse: 91 90 87 88  Resp: 16 17  19   Temp: 98.1 F  (36.7 C) 98.2 F (36.8 C)  98.5 F (36.9 C)  TempSrc: Oral   Oral  SpO2: 95% 97%     No intake or output data in the 24 hours ending 04/17/19 1743 There were no vitals filed for this visit.  Data Reviewed: I have personally reviewed and interpreted daily labs, tele strips, imagings as discussed above. I reviewed all nursing notes, pharmacy notes, vitals, pertinent old records I have discussed plan of care as described above with RN and patient/family.  CBC: Recent Labs  Lab 04/14/19 1633 04/14/19 1938 04/15/19 0817  WBC 9.0 17.9* 13.5*  NEUTROABS 6.3 15.4*  --   HGB 13.0 12.7 11.5*  HCT 42.1 40.7 36.6  MCV 99.5 98.3 98.9  PLT 408* 382 [id number]   Basic Metabolic Panel: Recent Labs  Lab 04/14/19 1633 04/15/19 0817  NA 140 139  K 4.1 4.0  CL 106 104  CO2 25 27  GLUCOSE 115* 128*  BUN 13 16  CREATININE 0.59 0.59  CALCIUM 8.9 8.6*    Studies: No results found.  Scheduled Meds: . erythromycin  1 application Right Eye [id number]  . feeding supplement (ENSURE ENLIVE)  237 mL Oral BID BM  . morphine CONCENTRATE  10 mg Oral TID  . QUEtiapine  12.5 mg Oral QHS   Continuous Infusions:  PRN Meds: acetaminophen **OR** acetaminophen, atropine, diphenhydrAMINE, haloperidol **OR** haloperidol **OR** haloperidol lactate, HYDROmorphone (DILAUDID) injection, LORazepam **OR** LORazepam **OR** LORazepam, LORazepam, ondansetron **OR** ondansetron (ZOFRAN) IV, oxyCODONE **OR** oxyCODONE  Time spent: 35 minutes  Author: Berle Mull, MD [facility] 04/17/2019 5:43 PM  To reach On-call, see care teams to locate the attending and reach out to them via [email or web address]. If 7PM-7AM, please contact night-coverage If you still have difficulty reaching the attending provider, please page the [facility] (Director on Call) for [facility] on amion for assistance.

## 2019-04-18 MED ORDER — ERYTHROMYCIN 5 MG/GM OP OINT: 1.0000 "application " | TOPICAL_OINTMENT | Freq: Three times a day (TID) | OPHTHALMIC | 0 refills | Status: DC

## 2019-04-18 MED ORDER — QUETIAPINE FUMARATE 25 MG PO TABS: 12.5000 mg | ORAL_TABLET | Freq: Every day | ORAL | Status: DC

## 2019-04-18 NOTE — Care Management Important Message (Signed)
Important Message  Patient Details  Name: [name] MRN: [id number] Date of Birth: 1934-09-09   Medicare Important Message Given:  Yes     Orbie Pyo 04/18/2019, 4:12 PM

## 2019-04-18 NOTE — Progress Notes (Signed)
RN unable to waste PRN oxycodone in pyxis due to pt being discharged and no longer in pyxis. 0.75 mL wasted with stericycle with Cameron Sprang RN

## 2019-04-18 NOTE — TOC Progression Note (Signed)
Transition of Care [facility]) - Progression Note    Patient Details  Name: [name] MRN: [id number] Date of Birth: 04-Nov-1934  Transition of [facility]) CM/SW [place], RN Phone Number: 04/18/2019, 10:04 AM  Clinical Narrative:    Case management spoke with Bronwen Betters, RNCM with [facility], and facility has a ready bed for the patient to transfer today via [place].  Patient will not need an updated COVID.  A DNR was placed on the hard chart for the physician to sign.  Patient will be allowed to transport with her current IV and will need medication from the primary nurse prior to transport to insure comfort for the ride.  The RN call call report to [phone] prior to transport, otherwise the main number of contact to the [facility] in [place] is [phone].  I will call [facility] prior to transport via [place] at [phone].  Will continue on patient's disposition.        Expected Discharge Plan and Services   Social Determinants of Health (SDOH) Interventions    Readmission Risk Interventions Readmission Risk Prevention Plan 04/18/2019  Transportation Screening Complete  PCP or Specialist Appt within 3-5 Days Complete  HRI or [place] Complete  Social Work Consult for [place] Planning/Counseling Complete  Palliative Care Screening Complete  Medication Review [employer]) Complete  Some recent data might be hidden

## 2019-04-18 NOTE — Progress Notes (Signed)
Report called to Santiago Glad at [facility]. All questions answered. Waiting on PTAR for transport.

## 2019-04-18 NOTE — Progress Notes (Signed)
Manufacturing engineer [facility]) Hospital Liaison Note:   Spoke to patient daughter to confirm interest in [facility] in [place] and offer a bed today.  Supported and answered questions about [place] services. Family agreeable to transfer this morning. Patient paper work completed and [facility] is ready to receive patient so please arrange transport as soon as possible. [facility] TOC aware.    Please send DNR paper work with patient.  Bedside RN - Please call report to [facility]ice [facility] RN at [phone]. And patient daughter requests to be called when patient leaves unit.  Please call with any related Hospice questions,  Gar Ponto, RN [facility] Liaison (on [place]) [phone]

## 2019-04-18 NOTE — Discharge Summary (Signed)
[facility] Discharge Summary   Patient: [name] [id number]  PCP: [name], MD  Date of admission: 04/14/2019   Date of discharge:  04/18/2019     Discharge Diagnoses:  Principal Problem:   Pelvic fracture ([place]) Active Problems:   Dementia ([place])   Subdural hematoma (H[facility])   Facial fracture ([facility])   SDH (subdural hematoma) (H[facility])   Pressure injury of skin   Admitted From: SNF Disposition:   RESIDENTIAL HOSPICE  Recommendations for Outpatient Follow-up:  1. PCP: establish care with hospice 2. Follow up LABS/TEST:  none  Follow-up Information    Hiram Gash, MD In 1 week.   Specialty: Orthopedic Surgery Why: For follow-up right hip Contact information: [address] [place] [address] [phone]        [name], MD. Schedule an appointment as soon as possible for a visit in 1 week(s).   Specialty: Internal Medicine Contact information: [address] [place] [address] [phone]        Wallace Going, DO. Schedule an appointment as soon as possible for a visit in 2 week(s).   Specialty: Plastic Surgery Contact information: [address] [place] [address] [phone]          Diet recommendation: Regular diet  Activity: The patient is advised to gradually reintroduce usual activities, as tolerated  Discharge Condition: stable  Code Status: DNR   History of present illness: As per the H and P dictated on admission, "[name] is a 85 y.o. female with history of advanced dementia who was admitted in January 2021 for right femur fracture underwent intramedullary nailing had a unwitnessed fall at the nursing facility and was brought to the ER.  As per the report patient was sitting on a chair and few minutes later was found on the floor.  Had right periorbital hematoma.  ED Course: In the ER patient had CT scans done which showed right facial fracture right subdural hematoma  right acetabular and pubic fracture.  On-call note orthopedic surgeon Dr. Griffin Basil was called for the pelvic fracture advised nonweightbearing and for the subdural hematoma neurosurgery has been called and they requested no surgical intervention.  And for the right facial fracture Dr. Marla Roe has been consulted.  There was some concern for possible pneumonia for which patient was started on antibiotics.  Patient is a DNR which was confirmed by ER physician with patient's daughter.  EKG shows sinus tachycardia.  Covid test was negative. "  Hospital Course:  Summary of her active problems in the hospital is as following.   1.  Subdural hematoma Small, right subdural hematoma seen on the CT scan. No mass-effect. No significant focal deficit. Neurosurgery was consulted who recommended conservative measures and do not think that repeating a CT scan is also indicated at this point given her poor surgical candidacy.  2.  Right facial fracture, right orbital floor fracture Plastic surgery consulted appreciate input. Currently on recommends an ointment.  Monitor.  3.  Right pelvic fracture with acetabulum and pubic rami Orthopedic consulted. Currently recommendation is for conservative measures and nonweightbearing   4.  Right pelvic hematoma From the fall. Currently pressing on the bladder. Continue pain control for now.  5.  Goals of care discussion. Patient with failure to thrive and recurrent fall as well as dementia. Patient had a conversation with palliative care last admission and goal was to consider SNF for rehabilitation and if there is no  improvement or benefit out of the rehabilitation consider hospice. Patient currently in with multiple fractures and multiple injury with pain as her primary complaint. Discussed with family recommended to transition to a course of hospice and will comfort care. Family agreed to this recommendation. Daughter is concerned regarding uncontrolled  pain with patient's agitation. I placed scheduled morphine order based on shared decision making. We will reduce the frequency of morphine from 4 times daily to 3 times daily. Patient appears comfortable although remains confused and has no p.o. intake since admission. Anticipating prognosis less than 2 weeks given her poor p.o. intake and confusion. Also at risk for infection given her multiple fractures.  6.  Emphysema Right upper lobe pneumonia Procalcitonin mildly elevated. Started on IV doxycycline.  7. Pressure ulcer right heel POA  Pressure Injury 04/17/19 Heel Right Stage 2 -  Partial thickness loss of dermis presenting as a shallow open injury with a red, pink wound bed without slough. (Active)  04/17/19 1022  Location: Heel  Location Orientation: Right  Staging: Stage 2 -  Partial thickness loss of dermis presenting as a shallow open injury with a red, pink wound bed without slough.  Wound Description (Comments):   Present on Admission: Yes     On the day of the discharge the patient's vitals were stable, and no other acute medical condition were reported by patient. the patient was felt safe to be discharge at residentail hospice at [place] with hospice.  Consultants: orthopedics, neurosurgery, plastic surgery  Procedures: none  Discharge Exam: General: Appear in mild distress, no Rash; Oral Mucosa Clear, dry. Cardiovascular: S1 and S2 Present, no Murmur, Respiratory: normal respiratory effort, Bilateral Air entry present and no Crackles, no wheezes Abdomen: Bowel Sound present, Soft and no tenderness, no hernia Extremities: no Pedal edema, no calf tenderness Neurology: confused and not oriented to time, place, and person affect flat in affect.  There were no vitals filed for this visit. Vitals:   04/18/19 0441 04/18/19 0741  BP: 120/71 100/64  Pulse: 92 82  Resp:  16  Temp: 98.1 F (36.7 C) 98.6 F (37 C)  SpO2: 99% 99%    DISCHARGE  MEDICATION: Allergies as of 04/18/2019   No Known Allergies     Medication List    STOP taking these medications   acetaminophen 325 MG tablet Commonly known as: TYLENOL   ascorbic acid 500 MG tablet Commonly known as: VITAMIN C   enoxaparin 40 MG/0.4ML injection Commonly known as: LOVENOX   feeding supplement (PRO-STAT SUGAR FREE 64) Liqd   ferrous sulfate 325 (65 FE) MG tablet   tamsulosin 0.4 MG Caps capsule Commonly known as: FLOMAX   Vitamin D-3 25 MCG (1000 UT) Caps   Vitamin D3 25 MCG tablet Commonly known as: Vitamin D     TAKE these medications   erythromycin ophthalmic ointment Place 1 application into the right eye every 8 (eight) hours.   feeding supplement (ENSURE ENLIVE) Liqd Take 237 mLs by mouth 3 (three) times daily between meals. What changed: Another medication with the same name was removed. Continue taking this medication, and follow the directions you see here.   oxyCODONE 5 MG immediate release tablet Commonly known as: Oxy IR/ROXICODONE Take 1 pills every 4-6 hrs as needed for pain What changed:   how much to take  how to take this  when to take this  reasons to take this  additional instructions   polyethylene glycol 17 g packet Commonly known as: MIRALAX /  GLYCOLAX Take 17 g by mouth daily as needed for mild constipation. What changed: reasons to take this   QUEtiapine 25 MG tablet Commonly known as: SEROQUEL Take 0.5 tablets (12.5 mg total) by mouth at bedtime.   senna-docusate 8.6-50 MG tablet Commonly known as: Senokot-S Take 2 tablets by mouth 2 (two) times daily.   zinc sulfate 220 (50 Zn) MG capsule Take 220 mg by mouth daily.      No Known Allergies Discharge Instructions    Diet - low sodium heart healthy   Complete by: As directed    Increase activity slowly   Complete by: As directed       The results of significant diagnostics from this hospitalization (including imaging, microbiology, ancillary and  laboratory) are listed below for reference.    Significant Diagnostic Studies: CT ABDOMEN PELVIS WO CONTRAST  Result Date: 04/14/2019 CLINICAL DATA:  Acute pain due to trauma EXAM: CT CHEST, ABDOMEN AND PELVIS WITHOUT CONTRAST TECHNIQUE: Multidetector CT imaging of the chest, abdomen and pelvis was performed following the standard protocol without IV contrast. COMPARISON:  CT pelvis from the same day. FINDINGS: CT CHEST FINDINGS Cardiovascular: The heart size is essentially normal. There is no evidence for thoracic aortic aneurysm. Coronary artery calcifications are noted. There is no significant pericardial effusion. Mediastinum/Nodes: --No mediastinal or hilar lymphadenopathy. --No axillary lymphadenopathy. --No supraclavicular lymphadenopathy. --Normal thyroid gland. --The esophagus is unremarkable Lungs/Pleura: There is a 1 cm ground-glass airspace opacity in the right upper lobe (axial series 6, image 26). There is no pneumothorax or large focal infiltrate. There is some atelectasis at the lung bases, right worse than left. Musculoskeletal: There are old bilateral right-sided rib fractures. There is no acute displaced fracture. There is sclerosis of the superior endplate of the T5 vertebral body. There is mild height loss of the T5 vertebral body. There are chronic appearing compression fractures of the T11 and T7 vertebral bodies. CT ABDOMEN PELVIS FINDINGS Hepatobiliary: The liver is normal. Normal gallbladder.There is no biliary ductal dilation. Pancreas: Normal contours without ductal dilatation. No peripancreatic fluid collection. Spleen: Unremarkable. Adrenals/Urinary Tract: --Adrenal glands: Unremarkable. --Right kidney/ureter: There are punctate nonobstructing stones in the lower pole the right kidney. --Left kidney/ureter: No hydronephrosis or radiopaque kidney stones. --Urinary bladder: Unremarkable. Stomach/Bowel: --Stomach/Duodenum: No hiatal hernia or other gastric abnormality. Normal duodenal  course and caliber. --Small bowel: Unremarkable. --Colon: Rectosigmoid diverticulosis without acute inflammation. --Appendix: Not visualized. No right lower quadrant inflammation or free fluid. Vascular/Lymphatic: Atherosclerotic calcification is present within the non-aneurysmal abdominal aorta, without hemodynamically significant stenosis. --No retroperitoneal lymphadenopathy. --No mesenteric lymphadenopathy. --No pelvic or inguinal lymphadenopathy. Reproductive: Unremarkable Other: No ascites or free air. The abdominal wall is normal. Musculoskeletal. Comminuted fractures again noted of the right acetabulum involving the anterior and posterior columns. There is a moderate-sized pelvic hematoma. There is a probable acute nondisplaced fracture of the inferior pubic ramus on the right. There is a chronic appearing there is a chronic appearing compression fracture of the L5 vertebral body. IMPRESSION: 1. No acute traumatic abnormality detected involving the patient's thorax. 2. Again noted are acute pelvic fractures as previously described with a moderate-sized pelvic hematoma. 3. There is an age-indeterminate compression fracture of the superior endplate of the T5 vertebral body. Correlation with point tenderness is recommended. There are additional chronic appearing thoracic spine compression fractures. 4. There is a 1 cm ground-glass airspace opacity in the right upper lobe. Initial follow-up with CT at 6-12 months is recommended to confirm persistence. If persistent, repeat  CT is recommended every 2 years until 5 years of stability has been established. This recommendation follows the consensus statement: Guidelines for Management of Incidental Pulmonary Nodules Detected on CT Images: From the Fleischner Society 2017; Radiology 2017; 284:228-243. 5. Rectosigmoid diverticulosis without acute inflammation. 6. Nonobstructive right nephrolithiasis. Aortic Atherosclerosis (ICD10-I70.0). Electronically Signed   By:  Constance Holster M.D.   On: 04/14/2019 21:22   DG Chest 1 View  Result Date: 04/14/2019 CLINICAL DATA:  Fall. EXAM: CHEST  1 VIEW COMPARISON:  02/04/2019 FINDINGS: Lungs are hyperexpanded. Interstitial markings are diffusely coarsened with chronic features. Patchy opacity identified over the peripheral right upper lung. No dense focal airspace consolidation or pleural effusion. The cardiopericardial silhouette is within normal limits for size. Skin folds overlie the left hemithorax. Nodular density/densities projecting over the right parahilar lung is compatible with pad for telemetry leads. Bones are diffusely demineralized. IMPRESSION: 1. Hyperexpansion suggests emphysema. 2. Subtle ill-defined nodular density peripheral right upper lobe. This may be infectious/inflammatory. Follow-up imaging recommended to ensure resolution. Electronically Signed   By: Misty Stanley M.D.   On: 04/14/2019 17:40   DG Lumbar Spine Complete  Result Date: 04/14/2019 CLINICAL DATA:  Fall, pain EXAM: LUMBAR SPINE - COMPLETE 4+ VIEW COMPARISON:  Two-view chest x-ray 02/03/2019. FINDINGS: Diffuse osteopenia. Mild compression deformity at L1 and T11 and T10. The T10 fracture appears stable when compared to prior chest x-ray. The other levels are not well visualized on the prior chest x-ray. No malalignment. Diffuse degenerative disc and facet disease. IMPRESSION: Age-indeterminate mild compression fractures at T11 and L1. Chronic compression fracture at T10. Diffuse osteopenia. Electronically Signed   By: Rolm Baptise M.D.   On: 04/14/2019 17:43   CT Head Wo Contrast  Result Date: 04/14/2019 CLINICAL DATA:  85 year old female with head trauma. EXAM: CT HEAD WITHOUT CONTRAST CT MAXILLOFACIAL WITHOUT CONTRAST CT CERVICAL SPINE WITHOUT CONTRAST TECHNIQUE: Multidetector CT imaging of the head, cervical spine, and maxillofacial structures were performed using the standard protocol without intravenous contrast. Multiplanar CT image  reconstructions of the cervical spine and maxillofacial structures were also generated. COMPARISON:  Head CT dated 02/03/2019. FINDINGS: CT HEAD FINDINGS Brain: Mild age-related atrophy and chronic microvascular ischemic changes. Subcentimeter bilateral basal ganglia old lacunar infarcts. There is a small right temporoparietal subdural hemorrhage measuring up to 3 mm in thickness. Tiny linear high attenuation along the right frontal cortex (26/5, 19/7) may be artifactual or represent focal cortical contusion. No mass effect or midline shift. Vascular: No hyperdense vessel or unexpected calcification. 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 Skull: No acute calvarial pathology. Prior left temporal craniotomy. Other: Small right periorbital contusion. CT MAXILLOFACIAL FINDINGS Osseous: There is a depressed angulated fracture of the right zygomatic arch. Mildly angulated fractures of the posterolateral wall of the right maxillary sinus as well as nondisplaced fracture of the anterior wall of the right maxillary sinus. There is mild sutural diastasis of the lateral wall of the right orbit. There is a fracture of the right orbital floor. No other acute fracture. No mandibular dislocation. Orbits: The globes and retro-orbital fat are preserved. Sinuses: There is complete opacification of the right maxillary sinus consistent with hemosinus. No air-fluid level. The mastoid air cells are clear. Soft tissues: Mild right facial contusion. CT CERVICAL SPINE FINDINGS Alignment: No acute subluxation. Skull base and vertebrae: No acute fracture. Osteopenia. Soft tissues and spinal canal: No prevertebral fluid or swelling. No visible canal hematoma. Disc levels: Degenerative changes primarily at C5-C6 with disc space  narrowing and bone spurring. There is grade 1 C4-C5 anterolisthesis. Upper chest: Negative. Other: None IMPRESSION: 1. Small right temporoparietal subdural hemorrhage. No mass effect or midline shift. 2.  No acute/traumatic cervical spine pathology. 3. Right facial bone fractures as above. These results were called by telephone at the time of interpretation on 04/14/2019 at 6:07 pm to provider [facility] , who verbally acknowledged these results. Electronically Signed   By: Anner Crete M.D.   On: 04/14/2019 18:23   CT Chest Wo Contrast  Result Date: 04/14/2019 CLINICAL DATA:  Acute pain due to trauma EXAM: CT CHEST, ABDOMEN AND PELVIS WITHOUT CONTRAST TECHNIQUE: Multidetector CT imaging of the chest, abdomen and pelvis was performed following the standard protocol without IV contrast. COMPARISON:  CT pelvis from the same day. FINDINGS: CT CHEST FINDINGS Cardiovascular: The heart size is essentially normal. There is no evidence for thoracic aortic aneurysm. Coronary artery calcifications are noted. There is no significant pericardial effusion. Mediastinum/Nodes: --No mediastinal or hilar lymphadenopathy. --No axillary lymphadenopathy. --No supraclavicular lymphadenopathy. --Normal thyroid gland. --The esophagus is unremarkable Lungs/Pleura: There is a 1 cm ground-glass airspace opacity in the right upper lobe (axial series 6, image 26). There is no pneumothorax or large focal infiltrate. There is some atelectasis at the lung bases, right worse than left. Musculoskeletal: There are old bilateral right-sided rib fractures. There is no acute displaced fracture. There is sclerosis of the superior endplate of the T5 vertebral body. There is mild height loss of the T5 vertebral body. There are chronic appearing compression fractures of the T11 and T7 vertebral bodies. CT ABDOMEN PELVIS FINDINGS Hepatobiliary: The liver is normal. Normal gallbladder.There is no biliary ductal dilation. Pancreas: Normal contours without ductal dilatation. No peripancreatic fluid collection. Spleen: Unremarkable. Adrenals/Urinary Tract: --Adrenal glands: Unremarkable. --Right kidney/ureter: There are punctate nonobstructing stones in the  lower pole the right kidney. --Left kidney/ureter: No hydronephrosis or radiopaque kidney stones. --Urinary bladder: Unremarkable. Stomach/Bowel: --Stomach/Duodenum: No hiatal hernia or other gastric abnormality. Normal duodenal course and caliber. --Small bowel: Unremarkable. --Colon: Rectosigmoid diverticulosis without acute inflammation. --Appendix: Not visualized. No right lower quadrant inflammation or free fluid. Vascular/Lymphatic: Atherosclerotic calcification is present within the non-aneurysmal abdominal aorta, without hemodynamically significant stenosis. --No retroperitoneal lymphadenopathy. --No mesenteric lymphadenopathy. --No pelvic or inguinal lymphadenopathy. Reproductive: Unremarkable Other: No ascites or free air. The abdominal wall is normal. Musculoskeletal. Comminuted fractures again noted of the right acetabulum involving the anterior and posterior columns. There is a moderate-sized pelvic hematoma. There is a probable acute nondisplaced fracture of the inferior pubic ramus on the right. There is a chronic appearing there is a chronic appearing compression fracture of the L5 vertebral body. IMPRESSION: 1. No acute traumatic abnormality detected involving the patient's thorax. 2. Again noted are acute pelvic fractures as previously described with a moderate-sized pelvic hematoma. 3. There is an age-indeterminate compression fracture of the superior endplate of the T5 vertebral body. Correlation with point tenderness is recommended. There are additional chronic appearing thoracic spine compression fractures. 4. There is a 1 cm ground-glass airspace opacity in the right upper lobe. Initial follow-up with CT at 6-12 months is recommended to confirm persistence. If persistent, repeat CT is recommended every 2 years until 5 years of stability has been established. This recommendation follows the consensus statement: Guidelines for Management of Incidental Pulmonary Nodules Detected on CT Images:  From the Fleischner Society 2017; Radiology 2017; 284:228-243. 5. Rectosigmoid diverticulosis without acute inflammation. 6. Nonobstructive right nephrolithiasis. Aortic Atherosclerosis (ICD10-I70.0). Electronically Signed   By:  Constance Holster M.D.   On: 04/14/2019 21:22   CT Cervical Spine Wo Contrast  Result Date: 04/14/2019 CLINICAL DATA:  85 year old female with head trauma. EXAM: CT HEAD WITHOUT CONTRAST CT MAXILLOFACIAL WITHOUT CONTRAST CT CERVICAL SPINE WITHOUT CONTRAST TECHNIQUE: Multidetector CT imaging of the head, cervical spine, and maxillofacial structures were performed using the standard protocol without intravenous contrast. Multiplanar CT image reconstructions of the cervical spine and maxillofacial structures were also generated. COMPARISON:  Head CT dated 02/03/2019. FINDINGS: CT HEAD FINDINGS Brain: Mild age-related atrophy and chronic microvascular ischemic changes. Subcentimeter bilateral basal ganglia old lacunar infarcts. There is a small right temporoparietal subdural hemorrhage measuring up to 3 mm in thickness. Tiny linear high attenuation along the right frontal cortex (26/5, 19/7) may be artifactual or represent focal cortical contusion. No mass effect or midline shift. Vascular: No hyperdense vessel or unexpected calcification. 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 Skull: No acute calvarial pathology. Prior left temporal craniotomy. Other: Small right periorbital contusion. CT MAXILLOFACIAL FINDINGS Osseous: There is a depressed angulated fracture of the right zygomatic arch. Mildly angulated fractures of the posterolateral wall of the right maxillary sinus as well as nondisplaced fracture of the anterior wall of the right maxillary sinus. There is mild sutural diastasis of the lateral wall of the right orbit. There is a fracture of the right orbital floor. No other acute fracture. No mandibular dislocation. Orbits: The globes and retro-orbital fat are preserved.  Sinuses: There is complete opacification of the right maxillary sinus consistent with hemosinus. No air-fluid level. The mastoid air cells are clear. Soft tissues: Mild right facial contusion. CT CERVICAL SPINE FINDINGS Alignment: No acute subluxation. Skull base and vertebrae: No acute fracture. Osteopenia. Soft tissues and spinal canal: No prevertebral fluid or swelling. No visible canal hematoma. Disc levels: Degenerative changes primarily at C5-C6 with disc space narrowing and bone spurring. There is grade 1 C4-C5 anterolisthesis. Upper chest: Negative. Other: None IMPRESSION: 1. Small right temporoparietal subdural hemorrhage. No mass effect or midline shift. 2. No acute/traumatic cervical spine pathology. 3. Right facial bone fractures as above. These results were called by telephone at the time of interpretation on 04/14/2019 at 6:07 pm to provider [facility] , who verbally acknowledged these results. Electronically Signed   By: Anner Crete M.D.   On: 04/14/2019 18:23   CT PELVIS WO CONTRAST  Result Date: 04/14/2019 CLINICAL DATA:  Pelvic trauma EXAM: CT PELVIS WITHOUT CONTRAST TECHNIQUE: Multidetector CT imaging of the pelvis was performed following the standard protocol without intravenous contrast. COMPARISON:  Plain films today FINDINGS: Urinary Tract:  No abnormality visualized. Bowel:  Sigmoid diverticulosis.  No active diverticulitis. Vascular/Lymphatic: Aortoiliac atherosclerosis.  No adenopathy. Reproductive:  Uterus and adnexa unremarkable.  No mass. Other: Right pelvic hematoma noted within the right extraperitoneal space, displacing the bladder to the left. Enlargement of right pelvic abductor muscles compatible with intramuscular hematomas. Musculoskeletal: Fractures noted through the right acetabulum and involving the anterior, medial and posterior walls. Fracture through the right superior pubic ramus and inferior pubic ramus. Remote posttraumatic changes in the proximal right femur with  internal fixation. IMPRESSION: Fractures through the right acetabulum involving the anterior, medial and posterior walls. Fracture through the lateral right superior pubic ramus and the inferior pubic ramus. Remote posttraumatic and postsurgical changes in the right femur. Right pelvic intramuscular and extraperitoneal hematoma displacing the bladder to the left. Sigmoid diverticulosis. Aortoiliac atherosclerosis. Electronically Signed   By: Rolm Baptise M.D.   On: 04/14/2019 18:01  DG Hips Bilat W or Wo Pelvis 3-4 Views  Result Date: 04/14/2019 CLINICAL DATA:  85 year old female with fall. EXAM: DG HIP (WITH OR WITHOUT PELVIS) 3-4V BILAT COMPARISON:  Right hip radiograph dated 05/11/2019. FINDINGS: Evaluation is limited due to osteopenia and patient positioning and soft tissue attenuation. There is a mildly displaced acute fracture of the right acetabulum, new since the prior radiograph. There is a minimally displaced acute fracture of the right inferior pubic ramus. Comminuted fracture of the right femoral neck status post prior internal fixation. The hardware is intact. No other acute fracture identified. There is no dislocation. Degenerative changes of the lower lumbar spine. The soft tissues are unremarkable. IMPRESSION: Mildly displaced acute fracture of the right acetabulum and right inferior pubic ramus. Electronically Signed   By: Anner Crete M.D.   On: 04/14/2019 17:41   CT Maxillofacial WO CM  Result Date: 04/14/2019 CLINICAL DATA:  85 year old female with head trauma. EXAM: CT HEAD WITHOUT CONTRAST CT MAXILLOFACIAL WITHOUT CONTRAST CT CERVICAL SPINE WITHOUT CONTRAST TECHNIQUE: Multidetector CT imaging of the head, cervical spine, and maxillofacial structures were performed using the standard protocol without intravenous contrast. Multiplanar CT image reconstructions of the cervical spine and maxillofacial structures were also generated. COMPARISON:  Head CT dated 02/03/2019. FINDINGS: CT HEAD  FINDINGS Brain: Mild age-related atrophy and chronic microvascular ischemic changes. Subcentimeter bilateral basal ganglia old lacunar infarcts. There is a small right temporoparietal subdural hemorrhage measuring up to 3 mm in thickness. Tiny linear high attenuation along the right frontal cortex (26/5, 19/7) may be artifactual or represent focal cortical contusion. No mass effect or midline shift. Vascular: No hyperdense vessel or unexpected calcification. 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 Skull: No acute calvarial pathology. Prior left temporal craniotomy. Other: Small right periorbital contusion. CT MAXILLOFACIAL FINDINGS Osseous: There is a depressed angulated fracture of the right zygomatic arch. Mildly angulated fractures of the posterolateral wall of the right maxillary sinus as well as nondisplaced fracture of the anterior wall of the right maxillary sinus. There is mild sutural diastasis of the lateral wall of the right orbit. There is a fracture of the right orbital floor. No other acute fracture. No mandibular dislocation. Orbits: The globes and retro-orbital fat are preserved. Sinuses: There is complete opacification of the right maxillary sinus consistent with hemosinus. No air-fluid level. The mastoid air cells are clear. Soft tissues: Mild right facial contusion. CT CERVICAL SPINE FINDINGS Alignment: No acute subluxation. Skull base and vertebrae: No acute fracture. Osteopenia. Soft tissues and spinal canal: No prevertebral fluid or swelling. No visible canal hematoma. Disc levels: Degenerative changes primarily at C5-C6 with disc space narrowing and bone spurring. There is grade 1 C4-C5 anterolisthesis. Upper chest: Negative. Other: None IMPRESSION: 1. Small right temporoparietal subdural hemorrhage. No mass effect or midline shift. 2. No acute/traumatic cervical spine pathology. 3. Right facial bone fractures as above. These results were called by telephone at the time of  interpretation on 04/14/2019 at 6:07 pm to provider [facility] , who verbally acknowledged these results. Electronically Signed   By: Anner Crete M.D.   On: 04/14/2019 18:23    Microbiology: Recent Results (from the past 240 hour(s))  SARS CORONAVIRUS 2 (TAT 6-24 HRS) Nasopharyngeal Nasopharyngeal Swab     Status: None   Collection Time: 04/14/19  7:47 PM   Specimen: Nasopharyngeal Swab  Result Value Ref Range Status   SARS Coronavirus 2 NEGATIVE NEGATIVE Final    Comment: (NOTE) SARS-CoV-2 target nucleic acids are NOT  DETECTED. The SARS-CoV-2 RNA is generally detectable in upper and lower respiratory specimens during the acute phase of infection. Negative results do not preclude SARS-CoV-2 infection, do not rule out co-infections with other pathogens, and should not be used as the sole basis for treatment or other patient management decisions. Negative results must be combined with clinical observations, patient history, and epidemiological information. The expected result is Negative. Fact Sheet for Patients: SugarRoll.be Fact Sheet for Healthcare Providers: https://www.woods-mathews.com/ This test is not yet approved or cleared by the [place] FDA and  has been authorized for detection and/or diagnosis of SARS-CoV-2 by FDA under an Emergency Use Authorization (EUA). This EUA will remain  in effect (meaning this test can be used) for the duration of the COVID-19 declaration under Section 56 4(b)(1) of the Act, 21 U.S.C. section 360bbb-3(b)(1), unless the authorization is terminated or revoked sooner. Performed at [facility], [place] [address], [place], [place] [address]      Labs: CBC: Recent Labs  Lab 04/14/19 1633 04/14/19 1938 04/15/19 0817  WBC 9.0 17.9* 13.5*  NEUTROABS 6.3 15.4*  --   HGB 13.0 12.7 11.5*  HCT 42.1 40.7 36.6  MCV 99.5 98.3 98.9  PLT 408* 382 [id number]   Basic Metabolic Panel: Recent Labs  Lab  04/14/19 1633 04/15/19 0817  NA 140 139  K 4.1 4.0  CL 106 104  CO2 25 27  GLUCOSE 115* 128*  BUN 13 16  CREATININE 0.59 0.59  CALCIUM 8.9 8.6*   Liver Function Tests: Recent Labs  Lab 04/15/19 0817  AST 21  ALT 18  ALKPHOS 233*  BILITOT 0.6  PROT 6.3*  ALBUMIN 2.9*   No results for input(s): LIPASE, AMYLASE in the last 168 hours. No results for input(s): AMMONIA in the last 168 hours. Cardiac Enzymes: No results for input(s): CKTOTAL, CKMB, CKMBINDEX, TROPONINI in the last 168 hours. BNP (last 3 results) No results for input(s): BNP in the last 8760 hours. CBG: Recent Labs  Lab 04/14/19 1626 04/14/19 2339 04/15/19 0803  GLUCAP 105* 147* 110*    Time spent: 35 minutes  Signed:  Berle Mull  [facility]  04/18/2019 10:20 AM

## 2019-04-18 NOTE — Plan of Care (Signed)

## 2019-04-19 ENCOUNTER — Encounter: Payer: Self-pay | Admitting: Internal Medicine

## 2019-05-06 ENCOUNTER — Ambulatory Visit: Payer: MEDICARE | Admitting: Physician Assistant

## 2019-05-07 DEATH — deceased
